# Patient Record
Sex: Male | Born: 1990 | Race: Black or African American | Hispanic: No | Marital: Single | Smoking: Current every day smoker
Health system: Southern US, Community
[De-identification: ages and names within clinical notes are randomized; demographics above are authoritative.]

## PROBLEM LIST (undated history)

## (undated) DIAGNOSIS — I10 Essential (primary) hypertension: Secondary | ICD-10-CM

## (undated) HISTORY — DX: Essential (primary) hypertension: I10
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
## (undated) NOTE — *Deleted (*Deleted)
PMR Admission Coordinator Pre-Admission Assessment  Patient: Christopher Marks is an 65 y.o., male MRN: [id number] DOB: 1990-09-13 Height: 5\' 7"  (170.2 cm) Weight: 77.1 kg  Insurance Information HMO: ***    PPO: ***     PCP: ***     IPA: ***     80/20: ***     OTHER: *** PRIMARY: ***      Policy#: ***      Subscriber: *** CM Name: ***      Phone#: ***     Fax#: *** Pre-Cert#: ***      Employer: *** Benefits:  Phone #: ***     Name: *** Eff. Date: ***     Deduct: ***      Out of Pocket Max: ***      Life Max: *** CIR: ***      SNF: *** Outpatient: ***     Co-Pay: *** Home Health: ***      Co-Pay: *** DME: ***     Co-Pay: *** Providers: *** SECONDARY: ***      Policy#: ***     Phone#: ***  Financial Counselor: ***      Phone#: ***  The "Data Collection Information Summary" for patients in Inpatient Rehabilitation Facilities with attached "Privacy Act Statement-Health Care Records" was provided and verbally reviewed with: {CHL IP Patient Family WG:956213086}  Emergency Contact Information Contact Information    Name Relation Home Work Mobile   [place] Grandmother   [phone]      Current Medical History  Patient Admitting Diagnosis: *** History of Present Illness: ***    Patient's medical record from *** has been reviewed by the rehabilitation admission coordinator and physician.  Past Medical History  History reviewed. No pertinent past medical history.  Family History   family history is not on file.  Prior Rehab/Hospitalizations Has the patient had prior rehab or hospitalizations prior to admission? {Yes/No/Unknown:304600602}  Has the patient had major surgery during 100 days prior to admission? {Yes/No/Unknown:304600602}   Current Medications  Current Facility-Administered Medications:  .  acetaminophen (TYLENOL) tablet 650 mg, 650 mg, Oral, Q6H PRN, Gaynelle Adu, MD, 650 mg at 01/29/20 0440 .  chlorhexidine gluconate (MEDLINE KIT) (PERIDEX) 0.12 % solution  15 mL, 15 mL, Mouth Rinse, BID, Violeta Gelinas, MD, 15 mL at 01/29/20 0835 .  Chlorhexidine Gluconate Cloth 2 % PADS 6 each, 6 each, Topical, Daily, Violeta Gelinas, MD, 6 each at 01/27/20 0906 .  cholecalciferol (VITAMIN D3) tablet 2,000 Units, 2,000 Units, Oral, BID, Despina Hidden, PA-C, 2,000 Units at 01/29/20 1020 .  docusate sodium (COLACE) capsule 100 mg, 100 mg, Oral, BID, Diamantina Monks, MD, 100 mg at 01/29/20 1020 .  enoxaparin (LOVENOX) injection 30 mg, 30 mg, Subcutaneous, Q12H, Lovick, Lennie Odor, MD, 30 mg at 01/29/20 1021 .  folic acid (FOLVITE) tablet 1 mg, 1 mg, Oral, Daily, Lovick, Lennie Odor, MD, 1 mg at 01/29/20 1021 .  lactated ringers infusion, , Intravenous, Continuous, Despina Hidden, PA-C, Stopped at 01/29/20 1159 .  LORazepam (ATIVAN) tablet 1-4 mg, 1-4 mg, Oral, Q1H PRN **OR** LORazepam (ATIVAN) injection 1-4 mg, 1-4 mg, Intravenous, Q1H PRN, Diamantina Monks, MD .  MEDLINE mouth rinse, 15 mL, Mouth Rinse, BID, Fredricka Bonine, Chelsea A, MD, 15 mL at 01/29/20 1021 .  metoprolol tartrate (LOPRESSOR) injection 5 mg, 5 mg, Intravenous, Q6H, Connor, Chelsea A, MD, 5 mg at 01/29/20 1134 .  morphine 4 MG/ML injection 4 mg, 4 mg, Intravenous, Q4H PRN, Ulyses Southward  A, PA-C, 4 mg at 01/29/20 1154 .  multivitamin with minerals tablet 1 tablet, 1 tablet, Oral, Daily, Diamantina Monks, MD, 1 tablet at 01/29/20 1021 .  ondansetron (ZOFRAN-ODT) disintegrating tablet 4 mg, 4 mg, Oral, Q6H PRN **OR** ondansetron (ZOFRAN) injection 4 mg, 4 mg, Intravenous, Q6H PRN, Ulyses Southward A, PA-C, 4 mg at 01/29/20 1134 .  oxyCODONE (Oxy IR/ROXICODONE) immediate release tablet 5-10 mg, 5-10 mg, Oral, Q4H PRN, Diamantina Monks, MD, 10 mg at 01/29/20 0536 .  piperacillin-tazobactam (ZOSYN) IVPB 3.375 g, 3.375 g, Intravenous, Q8H, von Dohlen, Haley B, RPH .  simethicone (MYLICON) chewable tablet 80 mg, 80 mg, Oral, Q6H PRN, Gaynelle Adu, MD, 80 mg at 01/29/20 1021 .  sodium phosphate 45 mmol in dextrose 5 %  250 mL infusion, 45 mmol, Intravenous, Once, Phylliss Blakes A, MD, Last Rate: 44 mL/hr at 01/29/20 1200, Rate Verify at 01/29/20 1200 .  thiamine tablet 100 mg, 100 mg, Oral, Daily, 100 mg at 01/28/20 0949 **OR** thiamine (B-1) injection 100 mg, 100 mg, Intravenous, Daily, Diamantina Monks, MD, 100 mg at 01/29/20 1020 .  Vitamin D (Ergocalciferol) (DRISDOL) capsule 50,000 Units, 50,000 Units, Oral, Q Fri-1800, Despina Hidden, PA-C, 50,000 Units at 01/27/20 2055  Patients Current Diet:  Diet Order            Diet NPO time specified Except for: Sips with Meds  Diet effective now                 Precautions / Restrictions Precautions Precautions: Fall Restrictions Weight Bearing Restrictions: Yes RLE Weight Bearing: Non weight bearing   Has the patient had 2 or more falls or a fall with injury in the past year? {Yes/No/Unknown:304600602}  Prior Activity Level Community (5-7x/wk): driving, working  Prior Functional Level Self Care: Did the patient need help bathing, dressing, using the toilet or eating? {Prior Functional ZOXWR:604540981}  Indoor Mobility: Did the patient need assistance with walking from room to room (with or without device)? {Prior Functional XBJYN:829562130}  Stairs: Did the patient need assistance with internal or external stairs (with or without device)? {Prior Functional QMVHQ:469629528}  Functional Cognition: Did the patient need help planning regular tasks such as shopping or remembering to take medications? {Prior Functional UXLKG:401027253}  Home Assistive Devices / Equipment    Prior Device Use: Indicate devices/aids used by the patient prior to current illness, exacerbation or injury? {Prior Device GUYQ:034742595}  Current Functional Level Cognition  Overall Cognitive Status: Impaired/Different from baseline Current Attention Level: Focused, Sustained Orientation Level: Oriented to person, Oriented to time, Disoriented to situation, Disoriented to  place Following Commands: Follows one step commands with increased time, Follows one step commands inconsistently Safety/Judgement: Decreased awareness of safety, Decreased awareness of deficits General Comments: [place] Level V  [place] [employer] Scales of Cognitive Functioning: Confused/inappropriate/non-agitated    Extremity Assessment (includes Sensation/Coordination)  Upper Extremity Assessment: RUE deficits/detail RUE Deficits / Details: Pt requires cues to attend to Rt UE, but is heavily distracted by abdominal pain.  He demonstrates full AROM of fingers when cued to attend to it,  He requires max cues and support at elbow to move hand to mouth, but was unable to figure out how to extend elbow - appears possibly apraxic.  He would not attempt to lift arm up and did not attempt any shoulder movement citing abdominal pain as the reason  [facility]  RUE Coordination: decreased fine motor, decreased gross motor  Lower Extremity Assessment: Defer to PT evaluation  RLE Deficits / Details: right leg with normal post op pain and weakness.  Generally 2/5, limited assessment due to pain. RLE: Unable to fully assess due to pain    ADLs  Overall ADL's : Needs assistance/impaired Eating/Feeding: Moderate assistance Eating/Feeding Details (indicate cue type and reason): mod A to drink from cup with his Lt hand  Grooming: Wash/dry hands, Wash/dry face, Oral care, Maximal assistance, Sitting Grooming Details (indicate cue type and reason): max hand over hand assist  Upper Body Bathing: Total assistance, Sitting Lower Body Bathing: Total assistance, Sit to/from stand, Bed level Upper Body Dressing : Total assistance, Sitting Lower Body Dressing: Total assistance, +2 for physical assistance, Sit to/from stand, Bed level Toilet Transfer: Total assistance, +2 for physical assistance, +2 for safety/equipment, BSC Toilet Transfer Details (indicate cue type and reason): unable  Toileting- Clothing  Manipulation and Hygiene: Total assistance, Bed level, Sit to/from stand Functional mobility during ADLs: Total assistance, Maximal assistance, +2 for physical assistance, +2 for safety/equipment, Rolling walker    Mobility  Overal bed mobility: Needs Assistance Bed Mobility: Supine to Sit Supine to sit: +2 for physical assistance, HOB elevated, Max assist Sit to supine: +2 for safety/equipment, Max assist, HOB elevated General bed mobility comments: HOB elevated, exit to rt (due to location of HHFNC); assist to move legs over EOB, pt not using LUE to pull to sit at EOB as he did 10/16 (pad under pelvis to assist)    Transfers  Overall transfer level: Needs assistance Equipment used: Rolling walker (2 wheeled) Transfers: Squat Pivot Transfers Sit to Stand: +2 physical assistance, Max assist Squat pivot transfers: Mod assist, +2 physical assistance, +2 safety/equipment General transfer comment: chair positioned on his left; agreeable to get to recliner; followed instructional cues and maintained TDWB RLE (PT's foot under his foot, so not fully NWB)    Ambulation / Gait / Stairs / Wheelchair Mobility  Ambulation/Gait General Gait Details: Unable at this time.     Posture / Balance Dynamic Sitting Balance Sitting balance - Comments: min-guard up to min assist with rt leaning  Balance Overall balance assessment: Needs assistance Sitting-balance support: Feet supported, No upper extremity supported, Bilateral upper extremity supported, Single extremity supported Sitting balance-Leahy Scale: Poor Sitting balance - Comments: min-guard up to min assist with rt leaning  Standing balance support: Bilateral upper extremity supported Standing balance-Leahy Scale: Zero Standing balance comment: two person and up to three person max assist in standing with RW for support.     Special needs/care consideration {Special Care Needs/Care Considerations:304600603}   Previous Home Environment (from  acute therapy documentation) Living Arrangements: Spouse/significant other Available Help at Discharge: Family Type of Home: House Home Layout: One level Home Access: Stairs to enter Entrance Stairs-Rails: Left Entrance Stairs-Number of Steps: 3 Bathroom Shower/Tub: Engineer, manufacturing systems: Standard Bathroom Accessibility: Yes How Accessible: Accessible via walker Home Care Services: No Additional Comments: Pt reports he lives with his girlfriend who, he reports will be available to assist at [employer].  He also reports he stays sometimes with his grand mother who recently retired (no family present to confirm)  Discharge Living Setting Plans for Discharge Living Setting: Patient's home Type of Home at Discharge: House Discharge Home Layout: One level Discharge Home Access: Stairs to enter Entrance Stairs-Rails: Left Entrance Stairs-Number of Steps: 3 Discharge Bathroom Shower/Tub: Tub/shower unit Discharge Bathroom Toilet: Standard Discharge Bathroom Accessibility: Yes How Accessible: Accessible via walker Does the patient have any problems obtaining your medications?: No  Social/Family/Support Systems Patient  Roles: Parent Anticipated Caregiver: Loma Messing, grandmother Anticipated Caregiver's Contact Information: [phone] Discharge Plan Discussed with Primary Caregiver: Yes Is Caregiver In Agreement with Plan?: Yes Does Caregiver/Family have Issues with Lodging/Transportation while Pt is in Rehab?: No  Goals Pt/Family Agrees to Admission and willing to participate: Yes Program Orientation Provided & Reviewed with Pt/Caregiver Including Roles  & Responsibilities: Yes  Decrease burden of Care through IP rehab admission: {Inpatient Rehab Care:20780}  Possible need for SNF placement upon discharge: ***  Patient Condition: {PATIENT'S CONDITION:22832}  Preadmission Screen Completed By:  Domingo Pulse, 01/29/2020 12:20 PM  ______________________________________________________________________   Discussed status with Dr. [name] on *** at *** and received approval for admission today.  Admission Coordinator:  Domingo Pulse, CCC-SLP, time ***Dorna Bloom ***   Assessment/Plan: Diagnosis: 1. Does the need for close, 24 hr/day Medical supervision in concert with the patient's rehab needs make it unreasonable for this patient to be served in a less intensive setting? {yes_no_potentially:3041433} 2. Co-Morbidities requiring supervision/potential complications: *** 3. Due to {due YN:8295621}, does the patient require 24 hr/day rehab nursing? {yes_no_potentially:3041433} 4. Does the patient require coordinated care of a physician, rehab nurse, PT, OT, and SLP to address physical and functional deficits in the context of the above medical diagnosis(es)? {yes_no_potentially:3041433} Addressing deficits in the following areas: {deficits:3041436} 5. Can the patient actively participate in an intensive therapy program of at least 3 hrs of therapy 5 days a week? {yes_no_potentially:3041433} 6. The potential for patient to make measurable gains while on inpatient rehab is {potential:3041437} 7. Anticipated functional outcomes upon discharge from inpatient rehab: {functional outcomes:304600100} PT, {functional outcomes:304600100} OT, {functional outcomes:304600100} SLP 8. Estimated rehab length of stay to reach the above functional goals is: *** 9. Anticipated discharge destination: {anticipated dc setting:21604} 10. Overall Rehab/Functional Prognosis: {potential:3041437}   MD Signature: ***

---
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
## 2014-01-15 ENCOUNTER — Emergency Department (HOSPITAL_COMMUNITY)
Admission: EM | Admit: 2014-01-15 | Discharge: 2014-01-15 | Disposition: A | Discharge status: Home / Self Care | Payer: Self-pay | Attending: Emergency Medicine | Admitting: Emergency Medicine

## 2014-01-15 ENCOUNTER — Encounter (HOSPITAL_COMMUNITY): Payer: Self-pay | Admitting: Emergency Medicine

## 2014-01-15 DIAGNOSIS — H109 Unspecified conjunctivitis: Secondary | ICD-10-CM | POA: Insufficient documentation

## 2014-01-15 DIAGNOSIS — Z720 Tobacco use: Secondary | ICD-10-CM | POA: Insufficient documentation

## 2014-01-15 MED ORDER — FLUORESCEIN SODIUM 1 MG OP STRP
1.0000 | ORAL_STRIP | Freq: Once | OPHTHALMIC | Status: AC
  Administered 2014-01-15: 1 via OPHTHALMIC
  Filled 2014-01-15: qty 1

## 2014-01-15 MED ORDER — ERYTHROMYCIN 5 MG/GM OP OINT: TOPICAL_OINTMENT | OPHTHALMIC | Status: DC

## 2014-01-15 NOTE — ED Provider Notes (Signed)
CSN: [id number]     Arrival date & time 01/15/14  1547 History  This chart was scribed for non-physician practitioner, Christopher Marks. Christopher Seat, PA-C working with Christopher Bucco, MD by Christopher Marks, ED scribe. This patient was seen in room TR04C/TR04C and the patient's care was started at 4:52 PM.  Chief Complaint  Patient presents with  . Eye Drainage   The history is provided by the patient. No language interpreter was used.   HPI Comments: Christopher Marks is a 23 y.o. male who presents to the Emergency Department complaining of constant, gradually worsening redness, pain, and discharge that started in the right eye and spread to his left eye beginning 4 days ago. Pt states that symptoms were initially itchy, but became painful 2 days ago. Pt states that his eyes feel gritty. He denies any changes to his vision since the onset of symptoms. He tried OTC medication with no improvement of symptoms. Pt does not wear contacts or glasses. He denies trauma or injury to the area. 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  History reviewed. No pertinent past medical history. History reviewed. No pertinent past surgical history. History reviewed. No pertinent family history. History  Substance Use Topics  . Smoking status: Current Every Day Smoker  . Smokeless tobacco: Not on file  . Alcohol Use: Yes    Review of Systems  Eyes: Positive for pain, discharge, redness and itching. Negative for visual disturbance.  All other systems reviewed and are negative.     Allergies  Review of patient's allergies indicates no known allergies.  Home Medications   Prior to Admission medications   Medication Sig Start Date End Date Taking? Authorizing Provider  erythromycin ophthalmic ointment Place a 1/2 inch ribbon of ointment into the lower eyelid. 01/15/14   Christopher Pilot Irine Seal, PA-C   BP 137/93  Pulse 84  Temp(Src) 98.3 F (36.8 C) (Oral)  Resp 16  SpO2 100% Physical Exam  Nursing note  and vitals reviewed. Constitutional: He appears well-developed and well-nourished. No distress.  HENT:  Head: Normocephalic and atraumatic.  Eyes: Conjunctivae and EOM are normal.  Bilateral injected conjunctiva with purulent discharge  No uptake of fluorescein stain  Neck: Neck supple. No tracheal deviation present.  Cardiovascular: Normal rate.   Pulmonary/Chest: Effort normal. No respiratory distress.  Skin: Skin is warm and dry.  Psychiatric: He has a normal mood and affect. His behavior is normal.    ED Course  Procedures (including critical care time) DIAGNOSTIC STUDIES: Oxygen Saturation is 100% on RA, normal by my interpretation.    COORDINATION OF CARE: 4:51 PM Fluorescein test negative. Discussed conjunctivitis and treatment with patient. Will order erythromycin ophthalmic ointment and discussed application of medicine.  Discussed return to work after completion of antibiotics with pt at bedside and pt agreed to plan.  Labs Review Labs Reviewed - No data to display  Imaging Review No results found.   EKG Interpretation None      MDM   Final diagnoses:  Bilateral conjunctivitis   Referral to Optho.   16:28 Visual Acuity BM Visual Acuity - Bilateral Near: 20/20 ; R Near: 20/30 ; L Near: 20/20 Complete resolution of pain with tetracaine drops  23 y.o.Christopher Marks's evaluation in the Emergency Department is complete. It has been determined that no acute conditions requiring further emergency intervention are present at this time. The patient/guardian have been advised of the diagnosis and plan. We have discussed signs and symptoms that warrant  return to the ED, such as changes or worsening in symptoms.  Vital signs are stable at discharge. Filed Vitals:   01/15/14 1704  BP: 137/93  Pulse: 84  Temp: 98.3 F (36.8 C)  Resp: 16    Patient/guardian has voiced understanding and agreed to follow-up with the PCP or specialist.  I personally performed the  services described in this documentation, which was scribed in my presence. The recorded information has been reviewed and is accurate.   Christopher Matasiffany G Shakiah Wester, PA-C 01/21/14 [id number]

## 2014-01-15 NOTE — Discharge Instructions (Signed)

## 2014-01-15 NOTE — ED Notes (Signed)
Per pt sts for 4 days right eye swelling, redness and drainage. sts his girlfriend recently had the same issue.

## 2014-01-15 NOTE — ED Notes (Signed)
Declined W/C at D/C and was escorted to lobby by RN. 

## 2014-01-23 NOTE — ED Provider Notes (Signed)
Medical screening examination/treatment/procedure(s) were performed by non-physician practitioner and as supervising physician I was immediately available for consultation/collaboration.   EKG Interpretation None        Deepti Gunawan, MD 01/23/14 1001 

## 2015-09-05 ENCOUNTER — Ambulatory Visit (HOSPITAL_COMMUNITY)
Admission: EM | Admit: 2015-09-05 | Discharge: 2015-09-05 | Disposition: A | Discharge status: Home / Self Care | Payer: Self-pay | Attending: Family Medicine | Admitting: Family Medicine

## 2015-09-05 ENCOUNTER — Encounter (HOSPITAL_COMMUNITY): Payer: Self-pay | Admitting: Emergency Medicine

## 2015-09-05 DIAGNOSIS — W57XXXA Bitten or stung by nonvenomous insect and other nonvenomous arthropods, initial encounter: Secondary | ICD-10-CM

## 2015-09-05 DIAGNOSIS — T148 Other injury of unspecified body region: Secondary | ICD-10-CM

## 2015-09-05 NOTE — ED Provider Notes (Signed)
CSN: [id number]     Arrival date & time 09/05/15  1338 History   First MD Initiated Contact with Patient 09/05/15 1456     Chief Complaint  Patient presents with  . Insect Bite   (Consider location/radiation/quality/duration/timing/severity/associated sxs/prior Treatment) HPI Comments: 25 year old male states that he was in some thick brush outside several days ago. He had noticed a small insect bite to the right forearm at that time. Over the past 2-3 days he has had more itching and now there is a small crop of vesicles and an area less than 1 cm. No erythema, pustules or lymphangitis. Patient denies systemic symptoms. He states that when he looked at it is scared him and he took off work today.   History reviewed. No pertinent past medical history. History reviewed. No pertinent past surgical history. No family history on file. Social History  Substance Use Topics  . Smoking status: Current Every Day Smoker  . Smokeless tobacco: None  . Alcohol Use: Yes    Review of Systems  Constitutional: Negative.   HENT: Negative.   Eyes: Negative.   Respiratory: Negative.   Skin:       As per history of present illness  Minor itching to the insect bite site.  Neurological: Negative.   All other systems reviewed and are negative.   Allergies  Review of patient's allergies indicates no known allergies.  Home Medications   Prior to Admission medications   Medication Sig Start Date End Date Taking? Authorizing Provider  erythromycin ophthalmic ointment Place a 1/2 inch ribbon of ointment into the lower eyelid. 01/15/14   Marlon Peliffany Greene, PA-C   Meds Ordered and Administered this Visit  Medications - No data to display  BP 142/88 mmHg  Pulse 77  Temp(Src) 98.6 F (37 C) (Oral)  Resp 16  SpO2 100% No data found.   Physical Exam  Constitutional: He is oriented to person, place, and time. He appears well-developed and well-nourished. No distress.  HENT:  Mouth/Throat:  Oropharynx is clear and moist.  No facial swelling or rash.  Eyes: Conjunctivae and EOM are normal.  Neck: Normal range of motion. Neck supple.  Cardiovascular: Normal rate.   Pulmonary/Chest: Effort normal.  Musculoskeletal: He exhibits no edema or tenderness.  Neurological: He is alert and oriented to person, place, and time. He exhibits normal muscle tone.  Skin: Skin is warm and dry. No rash noted.  There is an S-shaped trail of closely connected vesicles in an area less than 1 cm to the ulnar aspect of the right forearm. No surrounding erythema, lymphangitis, pustules or signs of infection.  Psychiatric: He has a normal mood and affect.  Nursing note and vitals reviewed.   ED Course  Procedures (including critical care time)  Labs Review Labs Reviewed - No data to display  Imaging Review No results found.   Visual Acuity Review  Right Eye Distance:   Left Eye Distance:   Bilateral Distance:    Right Eye Near:   Left Eye Near:    Bilateral Near:         MDM   1. Insect bite    Diphenhydramine Topical Cortaid 1% cream 3 times a day as needed Watch for signs of infection. Should there be any worsening or no improvement and 3-4 days recheck promptly.    Hayden Rasmussenavid Luisdavid Hamblin, NP 09/05/15 1511

## 2015-09-05 NOTE — ED Notes (Signed)
Pt c/o rash/insect bite to right forearm onset x3 days associated w/irritation Denies fevers, chills A&O x4... No acute distress.

## 2015-12-25 ENCOUNTER — Encounter (HOSPITAL_COMMUNITY): Payer: Self-pay | Admitting: Emergency Medicine

## 2015-12-25 ENCOUNTER — Ambulatory Visit (INDEPENDENT_AMBULATORY_CARE_PROVIDER_SITE_OTHER): Payer: Self-pay

## 2015-12-25 ENCOUNTER — Ambulatory Visit (HOSPITAL_COMMUNITY)
Admission: EM | Admit: 2015-12-25 | Discharge: 2015-12-25 | Disposition: A | Discharge status: Home / Self Care | Payer: Self-pay

## 2015-12-25 DIAGNOSIS — S63602A Unspecified sprain of left thumb, initial encounter: Secondary | ICD-10-CM

## 2015-12-25 IMAGING — DX DG FINGER THUMB 2+V*L*
3 series · 3 of 3 positions shown · non-contrast
Comparison: None.

CLINICAL DATA: 25-year-old with left thumb injury after playing
basketball. Initial encounter.

EXAM:
LEFT THUMB 2+V

[finger ap]
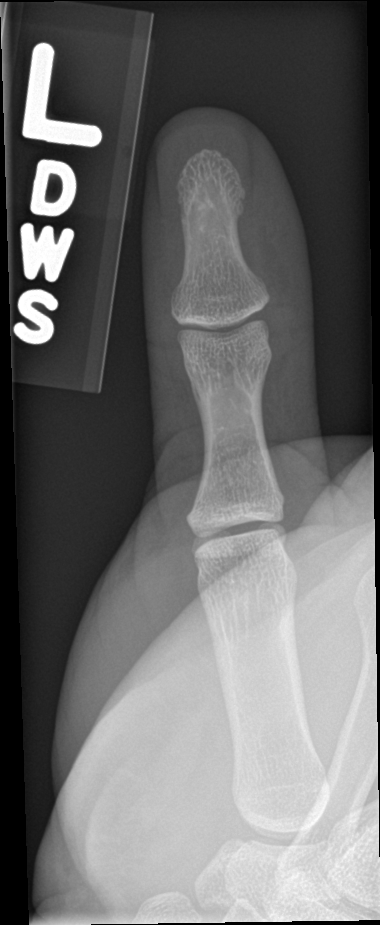

[finger obl]
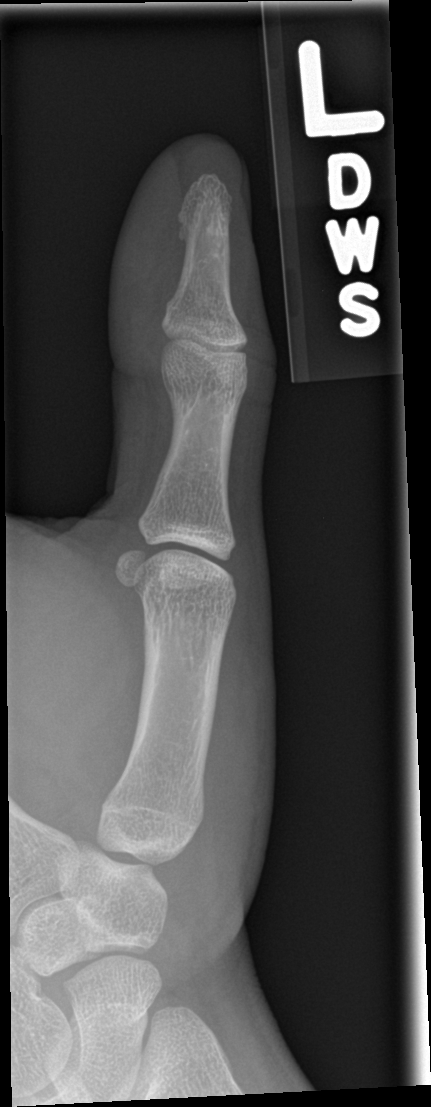

[finger lat]
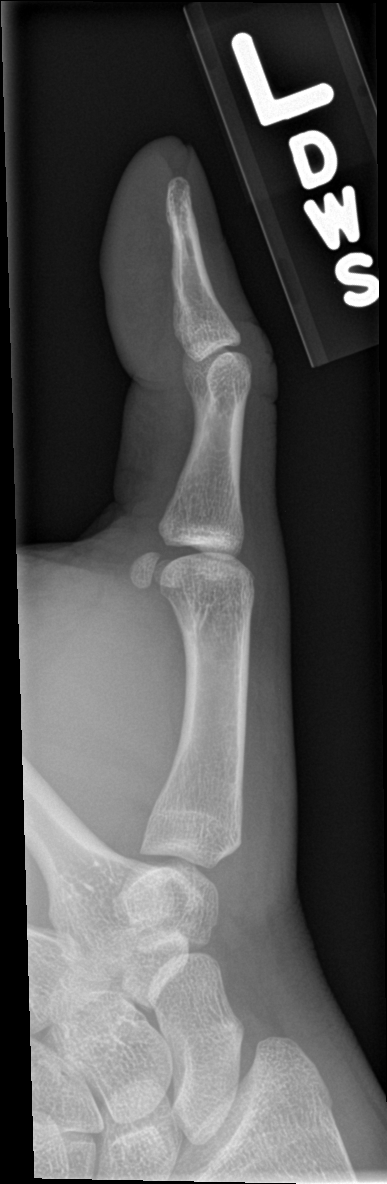

[3 of 3 positions shown; findings below may reference images not displayed]

FINDINGS: No fracture or dislocation.

If soft tissue injury were of high clinical concern and further
delineation clinically desired, MR imaging may then be considered.
IMPRESSION: No fracture or dislocation.

## 2015-12-25 MED ORDER — DICLOFENAC POTASSIUM 50 MG PO TABS: 50.0000 mg | ORAL_TABLET | Freq: Three times a day (TID) | ORAL | 0 refills | Status: DC

## 2015-12-25 NOTE — ED Triage Notes (Signed)
Patient reports both thumbs sore, but patient is most concerned with left thumb/hand pain.  Patient was playing basketball yesterday.  Reports several players falling over each other .  Patient not sure exactly how he landed on left thumb/hand.  Today has pain in base of thumb in palm of hand.    Patient reports he needs a work note.  Unable to work due to pain.

## 2015-12-25 NOTE — ED Notes (Signed)
Patient did receive a work note as agreed to by dr Artis Flockkindl.

## 2015-12-25 NOTE — ED Provider Notes (Signed)
[facility]    CSN: [id number] Arrival date & time: 12/25/15  1118  First Provider Contact:  First MD Initiated Contact with Patient 12/25/15 1219        History   Chief Complaint Chief Complaint  Patient presents with  . Finger Injury    HPI Christopher Marks is a 25 y.o. male.    Hand Injury  Location:  Finger Finger location:  L thumb Injury: yes   Time since incident:  1 day Mechanism of injury comment:  Injured yest playing bball when sev players fell . Pain details:    Radiates to:  Does not radiate   Severity:  Mild   Progression:  Unchanged Dislocation: no   Foreign body present:  No foreign bodies Prior injury to area:  No Relieved by:  NSAIDs Associated symptoms: stiffness   Associated symptoms: no decreased range of motion and no swelling     History reviewed. No pertinent past medical history.  There are no active problems to display for this patient.   History reviewed. No pertinent surgical history.     Home Medications    Prior to Admission medications   Medication Sig Start Date End Date Taking? Authorizing Provider  acetaminophen (TYLENOL) 325 MG tablet Take 650 mg by mouth every 6 (six) hours as needed.   Yes Historical Provider, MD  ibuprofen (ADVIL,MOTRIN) 200 MG tablet Take 200 mg by mouth every 6 (six) hours as needed.   Yes Historical Provider, MD  diclofenac (CATAFLAM) 50 MG tablet Take 1 tablet (50 mg total) by mouth 3 (three) times daily. 12/25/15   Linna Hoff, MD  erythromycin ophthalmic ointment Place a 1/2 inch ribbon of ointment into the lower eyelid. Patient not taking: Reported on 12/25/2015 01/15/14   Marlon Pel, PA-C    Family History History reviewed. No pertinent family history.  Social History Social History  Substance Use Topics  . Smoking status: Current Every Day Smoker  . Smokeless tobacco: Never Used  . Alcohol use Yes     Allergies   Review of patient's allergies indicates no known  allergies.   Review of Systems Review of Systems  Constitutional: Negative.   Musculoskeletal: Positive for stiffness. Negative for joint swelling.  Skin: Negative.   All other systems reviewed and are negative.    Physical Exam Triage Vital Signs ED Triage Vitals  Enc Vitals Group     BP 12/25/15 1146 (!) 157/105     Pulse Rate 12/25/15 1146 81     Resp 12/25/15 1146 16     Temp 12/25/15 1146 98.5 F (36.9 C)     Temp Source 12/25/15 1146 Oral     SpO2 12/25/15 1146 99 %     Weight --      Height --      Head Circumference --      Peak Flow --      Pain Score 12/25/15 1203 10     Pain Loc --      Pain Edu? --      Excl. in GC? --    No data found.   Updated Vital Signs BP (!) 157/105 (BP Location: Right Arm) Comment: notified Kim of BP  Pulse 81   Temp 98.5 F (36.9 C) (Oral)   Resp 16   SpO2 99%   Visual Acuity Right Eye Distance:   Left Eye Distance:   Bilateral Distance:    Right Eye Near:   Left Eye Near:  Bilateral Near:     Physical Exam  Constitutional: He is oriented to person, place, and time. He appears well-developed and well-nourished. No distress.  Musculoskeletal: Normal range of motion. He exhibits tenderness.       Left hand: He exhibits tenderness. He exhibits no deformity. Normal sensation noted. Normal strength noted.       Hands: Neurological: He is alert and oriented to person, place, and time.  Skin: Skin is warm and dry.  Nursing note and vitals reviewed.    UC Treatments / Results  Labs (all labs ordered are listed, but only abnormal results are displayed) Labs Reviewed - No data to display  EKG  EKG Interpretation None       Radiology No results found. X-rays reviewed and report per radiologist.  Procedures Procedures (including critical care time)  Medications Ordered in UC Medications - No data to display   Initial Impression / Assessment and Plan / UC Course  I have reviewed the triage vital signs  and the nursing notes.  Pertinent labs & imaging results that were available during my care of the patient were reviewed by me and considered in my medical decision making (see chart for details).  Clinical Course      Final Clinical Impressions(s) / UC Diagnoses   Final diagnoses:  Sprain of left thumb, initial encounter    New Prescriptions Discharge Medication List as of 12/25/2015  1:09 PM    START taking these medications   Details  diclofenac (CATAFLAM) 50 MG tablet Take 1 tablet (50 mg total) by mouth 3 (three) times daily., Starting Tue 12/25/2015, Print         Linna HoffJames D Nashly Olsson, MD 12/28/15 1012

## 2015-12-25 NOTE — Discharge Instructions (Signed)
Wear splint and use medicine as needed, see orthopedist if further problems. °

## 2016-04-07 ENCOUNTER — Emergency Department (HOSPITAL_COMMUNITY): Payer: Self-pay

## 2016-04-07 ENCOUNTER — Emergency Department (HOSPITAL_COMMUNITY)
Admission: EM | Admit: 2016-04-07 | Discharge: 2016-04-07 | Disposition: A | Discharge status: Home / Self Care | Payer: Self-pay | Attending: Emergency Medicine | Admitting: Emergency Medicine

## 2016-04-07 DIAGNOSIS — Y929 Unspecified place or not applicable: Secondary | ICD-10-CM | POA: Insufficient documentation

## 2016-04-07 DIAGNOSIS — Y999 Unspecified external cause status: Secondary | ICD-10-CM | POA: Insufficient documentation

## 2016-04-07 DIAGNOSIS — S0990XA Unspecified injury of head, initial encounter: Secondary | ICD-10-CM | POA: Insufficient documentation

## 2016-04-07 DIAGNOSIS — F17200 Nicotine dependence, unspecified, uncomplicated: Secondary | ICD-10-CM | POA: Insufficient documentation

## 2016-04-07 DIAGNOSIS — Y939 Activity, unspecified: Secondary | ICD-10-CM | POA: Insufficient documentation
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
## 2016-04-09 ENCOUNTER — Emergency Department (HOSPITAL_COMMUNITY)
Admission: EM | Admit: 2016-04-09 | Discharge: 2016-04-10 | Disposition: A | Discharge status: Home / Self Care | Payer: No Typology Code available for payment source | Attending: Emergency Medicine | Admitting: Emergency Medicine

## 2016-04-09 ENCOUNTER — Encounter (HOSPITAL_COMMUNITY): Payer: Self-pay

## 2016-04-09 DIAGNOSIS — T24032A Burn of unspecified degree of left lower leg, initial encounter: Secondary | ICD-10-CM | POA: Insufficient documentation

## 2016-04-09 DIAGNOSIS — T300 Burn of unspecified body region, unspecified degree: Secondary | ICD-10-CM

## 2016-04-09 DIAGNOSIS — T22012A Burn of unspecified degree of left forearm, initial encounter: Secondary | ICD-10-CM | POA: Diagnosis not present

## 2016-04-09 DIAGNOSIS — Y939 Activity, unspecified: Secondary | ICD-10-CM | POA: Insufficient documentation

## 2016-04-09 DIAGNOSIS — Y92410 Unspecified street and highway as the place of occurrence of the external cause: Secondary | ICD-10-CM | POA: Diagnosis not present

## 2016-04-09 DIAGNOSIS — Z23 Encounter for immunization: Secondary | ICD-10-CM | POA: Diagnosis not present

## 2016-04-09 DIAGNOSIS — S79921A Unspecified injury of right thigh, initial encounter: Secondary | ICD-10-CM | POA: Diagnosis present

## 2016-04-09 DIAGNOSIS — Y999 Unspecified external cause status: Secondary | ICD-10-CM | POA: Diagnosis not present

## 2016-04-09 DIAGNOSIS — S7011XA Contusion of right thigh, initial encounter: Secondary | ICD-10-CM | POA: Insufficient documentation

## 2016-04-09 DIAGNOSIS — F17200 Nicotine dependence, unspecified, uncomplicated: Secondary | ICD-10-CM | POA: Insufficient documentation

## 2016-04-09 LAB — CBC
HEMATOCRIT: 44.6 % (ref 39.0–52.0)
HEMOGLOBIN: 15.6 g/dL (ref 13.0–17.0)
MCH: 28 pg (ref 26.0–34.0)
MCHC: 35 g/dL (ref 30.0–36.0)
MCV: 80.1 fL (ref 78.0–100.0)
Platelets: 271 10*3/uL (ref 150–400)
RBC: 5.57 MIL/uL (ref 4.22–5.81)
RDW: 12.9 % (ref 11.5–15.5)
WBC: 14.2 10*3/uL — ABNORMAL HIGH (ref 4.0–10.5)
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
## 2016-04-09 MED ORDER — SILVER SULFADIAZINE 1 % EX CREA
TOPICAL_CREAM | Freq: Once | CUTANEOUS | Status: AC
  Administered 2016-04-10: 01:00:00 via TOPICAL
  Filled 2016-04-09: qty 85

## 2016-04-09 MED ORDER — OXYCODONE-ACETAMINOPHEN 5-325 MG PO TABS
1.0000 | ORAL_TABLET | Freq: Once | ORAL | Status: AC
  Administered 2016-04-10: 1 via ORAL
  Filled 2016-04-09: qty 1

## 2016-04-09 MED ORDER — TETANUS-DIPHTH-ACELL PERTUSSIS 5-2.5-18.5 LF-MCG/0.5 IM SUSP
0.5000 mL | Freq: Once | INTRAMUSCULAR | Status: AC
  Administered 2016-04-10: 0.5 mL via INTRAMUSCULAR
  Filled 2016-04-09: qty 0.5
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
## 2016-04-10 ENCOUNTER — Emergency Department (HOSPITAL_COMMUNITY): Payer: No Typology Code available for payment source

## 2016-04-10 LAB — BASIC METABOLIC PANEL
Anion gap: 13 (ref 5–15)
BUN: 9 mg/dL (ref 6–20)
CALCIUM: 9.7 mg/dL (ref 8.9–10.3)
CO2: 23 mmol/L (ref 22–32)
CREATININE: 1.07 mg/dL (ref 0.61–1.24)
Chloride: 105 mmol/L (ref 101–111)
GFR calc Af Amer: >60 mL/min (ref >60)
GLUCOSE: 108 mg/dL — AB (ref 65–99)
Potassium: 3.4 mmol/L — ABNORMAL LOW (ref 3.5–5.1)
Sodium: 141 mmol/L (ref 135–145)

## 2016-04-10 MED ORDER — HYDROCODONE-ACETAMINOPHEN 5-325 MG PO TABS: 1.0000 | ORAL_TABLET | ORAL | 0 refills | Status: DC | PRN

## 2016-07-12 ENCOUNTER — Encounter (HOSPITAL_COMMUNITY): Payer: Self-pay | Admitting: Emergency Medicine

## 2016-07-12 ENCOUNTER — Emergency Department (HOSPITAL_COMMUNITY)
Admission: EM | Admit: 2016-07-12 | Discharge: 2016-07-12 | Disposition: A | Discharge status: Home / Self Care | Payer: Self-pay | Attending: Emergency Medicine | Admitting: Emergency Medicine

## 2016-07-12 DIAGNOSIS — F10120 Alcohol abuse with intoxication, uncomplicated: Secondary | ICD-10-CM | POA: Insufficient documentation

## 2016-07-12 DIAGNOSIS — F17200 Nicotine dependence, unspecified, uncomplicated: Secondary | ICD-10-CM | POA: Insufficient documentation

## 2016-07-12 DIAGNOSIS — Z79899 Other long term (current) drug therapy: Secondary | ICD-10-CM | POA: Insufficient documentation

## 2016-07-12 DIAGNOSIS — F10920 Alcohol use, unspecified with intoxication, uncomplicated: Secondary | ICD-10-CM

## 2016-07-12 LAB — CBC WITH DIFFERENTIAL/PLATELET
BASOS ABS: 0 10*3/uL (ref 0.0–0.1)
Basophils Relative: 0 %
Eosinophils Absolute: 0.1 10*3/uL (ref 0.0–0.7)
Eosinophils Relative: 2 %
HEMATOCRIT: 41.1 % (ref 39.0–52.0)
HEMOGLOBIN: 13.9 g/dL (ref 13.0–17.0)
LYMPHS ABS: 2.7 10*3/uL (ref 0.7–4.0)
LYMPHS PCT: 51 %
MCH: 27.6 pg (ref 26.0–34.0)
MCHC: 33.8 g/dL (ref 30.0–36.0)
MCV: 81.5 fL (ref 78.0–100.0)
Monocytes Absolute: 0.4 10*3/uL (ref 0.1–1.0)
Monocytes Relative: 7 %
NEUTROS ABS: 2.1 10*3/uL (ref 1.7–7.7)
NEUTROS PCT: 40 %
PLATELETS: 277 10*3/uL (ref 150–400)
RBC: 5.04 MIL/uL (ref 4.22–5.81)
RDW: 12.5 % (ref 11.5–15.5)
WBC: 5.2 10*3/uL (ref 4.0–10.5)

## 2016-07-12 LAB — RAPID URINE DRUG SCREEN, HOSP PERFORMED
AMPHETAMINES: NOT DETECTED
BENZODIAZEPINES: NOT DETECTED
Barbiturates: NOT DETECTED
Cocaine: NOT DETECTED
Opiates: NOT DETECTED
TETRAHYDROCANNABINOL: NOT DETECTED

## 2016-07-12 LAB — ETHANOL: Alcohol, Ethyl (B): 99 mg/dL — ABNORMAL HIGH (ref <5)

## 2016-07-12 LAB — BASIC METABOLIC PANEL
ANION GAP: 10 (ref 5–15)
BUN: 10 mg/dL (ref 6–20)
CHLORIDE: 106 mmol/L (ref 101–111)
CO2: 21 mmol/L — AB (ref 22–32)
Calcium: 9.2 mg/dL (ref 8.9–10.3)
Creatinine, Ser: 1.03 mg/dL (ref 0.61–1.24)
GFR calc Af Amer: >60 mL/min (ref >60)
GLUCOSE: 109 mg/dL — AB (ref 65–99)
POTASSIUM: 3.7 mmol/L (ref 3.5–5.1)
Sodium: 137 mmol/L (ref 135–145)

## 2016-07-12 LAB — ACETAMINOPHEN LEVEL: Acetaminophen (Tylenol), Serum: <10 ug/mL — ABNORMAL LOW (ref 10–30)

## 2016-07-12 LAB — SALICYLATE LEVEL: Salicylate Lvl: <7 mg/dL (ref 2.8–30.0)

## 2016-07-12 NOTE — ED Notes (Signed)
Per EDP:  Pt is currently VOLUNTARY, but if he tries to leave, he will be IVC'd.

## 2016-07-12 NOTE — ED Provider Notes (Signed)
[facility]-EMERGENCY DEPT Provider Note   CSN: [id number] Arrival date & time: 07/12/16  4098     History   Chief Complaint Chief Complaint  Patient presents with  . Homicidal  . IVC    HPI Christopher Marks is a 26 y.o. male.  Patient presents emergency department brought in by [employer] under IVC for homicidal ideations. Patient states that he was going to kill his girlfriend. He states that he has been staying with her in an apartment that is under her name, but he has been paying for the lease, as well as for all of the furnishings of the apartment, however she kicked him out of the apartment, and he was told that he could not reenter because he is not on the lease. This caused him to become angry and frustrated. He states that he then went to his grandmother's house, who proceeded to tell him that he should have never paid for any of those things anyway. This further frustrated the patient, and he began stating that he would kill his girlfriend. He states that he said this so that he can go to jail. He states that he wanted a place to rest and to think. He states that "I also told [employer] that I was drunk so that they would take me to jail."  He denies any other associated medical problems. Denies any other symptoms.   The history is provided by the patient. No language interpreter was used.    History reviewed. No pertinent past medical history.  There are no active problems to display for this patient.   History reviewed. No pertinent surgical history.     Home Medications    Prior to Admission medications   Medication Sig Start Date End Date Taking? Authorizing Provider  acetaminophen (TYLENOL) 325 MG tablet Take 650 mg by mouth every 6 (six) hours as needed.    Historical Provider, MD  diclofenac (CATAFLAM) 50 MG tablet Take 1 tablet (50 mg total) by mouth 3 (three) times daily. 12/25/15   Linna Hoff, MD  erythromycin ophthalmic ointment Place a 1/2 inch ribbon of ointment into  the lower eyelid. Patient not taking: Reported on 12/25/2015 01/15/14   Marlon Pel, PA-C  HYDROcodone-acetaminophen (NORCO/VICODIN) 5-325 MG tablet Take 1 tablet by mouth every 4 (four) hours as needed for moderate pain. 04/10/16   Gilda Crease, MD  ibuprofen (ADVIL,MOTRIN) 200 MG tablet Take 200 mg by mouth every 6 (six) hours as needed.    Historical Provider, MD    Family History History reviewed. No pertinent family history.  Social History Social History  Substance Use Topics  . Smoking status: Current Every Day Smoker  . Smokeless tobacco: Never Used  . Alcohol use Yes     Allergies   Patient has no known allergies.   Review of Systems Review of Systems  All other systems reviewed and are negative.    Physical Exam Updated Vital Signs There were no vitals taken for this visit.  Physical Exam  Constitutional: He is oriented to person, place, and time. He appears well-developed and well-nourished.  HENT:  Head: Normocephalic and atraumatic.  Eyes: Conjunctivae and EOM are normal. Pupils are equal, round, and reactive to light. Right eye exhibits no discharge. Left eye exhibits no discharge. No scleral icterus.  Neck: Normal range of motion. Neck supple. No JVD present.  Cardiovascular: Normal rate, regular rhythm and normal heart sounds.  Exam reveals no gallop and no friction rub.   No  murmur heard. Pulmonary/Chest: Effort normal and breath sounds normal. No respiratory distress. He has no wheezes. He has no rales. He exhibits no tenderness.  Abdominal: Soft. He exhibits no distension and no mass. There is no tenderness. There is no rebound and no guarding.  Musculoskeletal: Normal range of motion. He exhibits no edema or tenderness.  Neurological: He is alert and oriented to person, place, and time.  Skin: Skin is warm and dry.  Psychiatric: He has a normal mood and affect. His behavior is normal. Judgment and thought content normal.  Nursing note and  vitals reviewed.    ED Treatments / Results  Labs (all labs ordered are listed, but only abnormal results are displayed) Labs Reviewed  CBC WITH DIFFERENTIAL/PLATELET  BASIC METABOLIC PANEL  RAPID URINE DRUG SCREEN, HOSP PERFORMED  ACETAMINOPHEN LEVEL  SALICYLATE LEVEL  ETHANOL    EKG  EKG Interpretation None       Radiology No results found.  Procedures Procedures (including critical care time)  Medications Ordered in ED Medications - No data to display   Initial Impression / Assessment and Plan / ED Course  I have reviewed the triage vital signs and the nursing notes.  Pertinent labs & imaging results that were available during my care of the patient were reviewed by me and considered in my medical decision making (see chart for details).     Patient with expressed homicidal ideation. Under IVC. Medically clear pending normal labs. TTS consult pending.  Dispo per TTS/psych  6:06 AM [employer] did NOT IVC patient.  Patient is currently VOLUNTARY, but will be made IVC if he attempts to leave.  Currently agreeable with plan to speak with psych in the AM.  Final Clinical Impressions(s) / ED Diagnoses   Final diagnoses:  Homicidal ideations    New Prescriptions New Prescriptions   No medications on file     Roxy Horseman, PA-C 07/12/16 1610    Roxy Horseman, PA-C 07/12/16 [id number]    Zadie Rhine, MD 07/13/16 [phone]

## 2016-07-12 NOTE — ED Triage Notes (Signed)
Brought in by [facility] under IVC papers for homicidal ideations.  Per [employer], pt has been threatening to kill her ex-girlfriend/girlfriend.  Pt has been uncooperative and non-compliant with the [employer] officers at the scene.  Pt has stated, "if you're not going to put me to jail,  I am going to kill her".  Pt admits to drinking alcohol tonight.

## 2016-07-12 NOTE — ED Notes (Signed)
Bed: WA09 Expected date:  Expected time:  Means of arrival:  Comments: IVC/[employer]

## 2016-07-12 NOTE — ED Notes (Signed)
Pt is up and doesn't understand that he will be IV'ed if he tries to leave.  Pt is expressing that he has appointments and needs to be out of the hospital.  Pt stated that he wants to see the doctor.  Charge nurse  and MD informed.

## 2016-07-12 NOTE — ED Provider Notes (Signed)
Patient is now awake and alert. He wants to leave the emergency department. TTS yet to evaluate patient. States he is not homicidal or suicidal. His earlier statements were said out of anger and after drinking alcohol. He has no plan or desire to harm his girlfriend. I do not believe the patient currently poses an acute danger to himself or others. Will be discharged from the emergency department with outpatient resources.   Loren Racer, MD 07/12/16 [phone]

## 2016-07-12 NOTE — ED Notes (Signed)
[employer] left, but a sitter outside the patient's door.
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
## 2017-04-11 ENCOUNTER — Emergency Department (HOSPITAL_COMMUNITY)
Admission: EM | Admit: 2017-04-11 | Discharge: 2017-04-12 | Disposition: A | Discharge status: Home / Self Care | Payer: Self-pay | Attending: Emergency Medicine | Admitting: Emergency Medicine

## 2017-04-11 ENCOUNTER — Encounter (HOSPITAL_COMMUNITY): Payer: Self-pay | Admitting: Emergency Medicine

## 2017-04-11 DIAGNOSIS — Z23 Encounter for immunization: Secondary | ICD-10-CM | POA: Insufficient documentation

## 2017-04-11 DIAGNOSIS — S01511A Laceration without foreign body of lip, initial encounter: Secondary | ICD-10-CM | POA: Insufficient documentation

## 2017-04-11 DIAGNOSIS — F17200 Nicotine dependence, unspecified, uncomplicated: Secondary | ICD-10-CM | POA: Insufficient documentation

## 2017-04-11 DIAGNOSIS — Y929 Unspecified place or not applicable: Secondary | ICD-10-CM | POA: Insufficient documentation

## 2017-04-11 DIAGNOSIS — Y939 Activity, unspecified: Secondary | ICD-10-CM | POA: Insufficient documentation

## 2017-04-11 DIAGNOSIS — Y999 Unspecified external cause status: Secondary | ICD-10-CM | POA: Insufficient documentation

## 2017-04-11 DIAGNOSIS — F10920 Alcohol use, unspecified with intoxication, uncomplicated: Secondary | ICD-10-CM | POA: Insufficient documentation

## 2017-04-11 DIAGNOSIS — S50311A Abrasion of right elbow, initial encounter: Secondary | ICD-10-CM | POA: Insufficient documentation

## 2017-04-11 MED ORDER — TETANUS-DIPHTH-ACELL PERTUSSIS 5-2.5-18.5 LF-MCG/0.5 IM SUSP
0.5000 mL | Freq: Once | INTRAMUSCULAR | Status: AC
  Administered 2017-04-12: 0.5 mL via INTRAMUSCULAR
  Filled 2017-04-11: qty 0.5

## 2017-04-11 MED ORDER — LIDOCAINE HCL 2 % IJ SOLN
10.0000 mL | Freq: Once | INTRAMUSCULAR | Status: AC
  Administered 2017-04-12: 200 mg
  Filled 2017-04-11: qty 20

## 2017-04-11 NOTE — ED Triage Notes (Signed)
No missing teeth. No jaw pain.
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
## 2017-04-18 ENCOUNTER — Other Ambulatory Visit: Payer: Self-pay

## 2017-04-18 ENCOUNTER — Emergency Department (HOSPITAL_COMMUNITY)
Admission: EM | Admit: 2017-04-18 | Discharge: 2017-04-18 | Disposition: A | Discharge status: Home / Self Care | Payer: Self-pay | Attending: Emergency Medicine | Admitting: Emergency Medicine

## 2017-04-18 ENCOUNTER — Encounter (HOSPITAL_COMMUNITY): Payer: Self-pay | Admitting: *Deleted

## 2017-04-18 DIAGNOSIS — Z4802 Encounter for removal of sutures: Secondary | ICD-10-CM

## 2017-04-18 DIAGNOSIS — X58XXXD Exposure to other specified factors, subsequent encounter: Secondary | ICD-10-CM | POA: Insufficient documentation

## 2017-04-18 DIAGNOSIS — F17210 Nicotine dependence, cigarettes, uncomplicated: Secondary | ICD-10-CM | POA: Insufficient documentation

## 2017-04-18 DIAGNOSIS — S01511D Laceration without foreign body of lip, subsequent encounter: Secondary | ICD-10-CM | POA: Insufficient documentation

## 2017-04-18 NOTE — ED Triage Notes (Signed)
Pt here to have sutures removed to L upper lip.  No s/s of infection noted.
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
## 2017-04-18 NOTE — Discharge Instructions (Signed)
Please read attached information regarding your condition.

## 2017-08-04 ENCOUNTER — Other Ambulatory Visit: Payer: Self-pay

## 2017-08-04 ENCOUNTER — Encounter (HOSPITAL_COMMUNITY): Payer: Self-pay

## 2017-08-04 ENCOUNTER — Emergency Department (HOSPITAL_COMMUNITY)
Admission: EM | Admit: 2017-08-04 | Discharge: 2017-08-05 | Disposition: A | Discharge status: Home / Self Care | Payer: Self-pay | Attending: Emergency Medicine | Admitting: Emergency Medicine

## 2017-08-04 DIAGNOSIS — F17210 Nicotine dependence, cigarettes, uncomplicated: Secondary | ICD-10-CM | POA: Insufficient documentation

## 2017-08-04 DIAGNOSIS — R3129 Other microscopic hematuria: Secondary | ICD-10-CM | POA: Insufficient documentation

## 2017-08-04 LAB — CBC WITH DIFFERENTIAL/PLATELET
Basophils Absolute: 0 10*3/uL (ref 0.0–0.1)
Basophils Relative: 0 %
EOS PCT: 2 %
Eosinophils Absolute: 0.2 10*3/uL (ref 0.0–0.7)
HEMATOCRIT: 42.1 % (ref 39.0–52.0)
HEMOGLOBIN: 14.3 g/dL (ref 13.0–17.0)
LYMPHS ABS: 4 10*3/uL (ref 0.7–4.0)
LYMPHS PCT: 43 %
MCH: 28.1 pg (ref 26.0–34.0)
MCHC: 34 g/dL (ref 30.0–36.0)
MCV: 82.9 fL (ref 78.0–100.0)
Monocytes Absolute: 0.7 10*3/uL (ref 0.1–1.0)
Monocytes Relative: 8 %
Neutro Abs: 4.3 10*3/uL (ref 1.7–7.7)
Neutrophils Relative %: 47 %
PLATELETS: 253 10*3/uL (ref 150–400)
RBC: 5.08 MIL/uL (ref 4.22–5.81)
RDW: 12.8 % (ref 11.5–15.5)
WBC: 9.3 10*3/uL (ref 4.0–10.5)

## 2017-08-04 LAB — URINALYSIS, ROUTINE W REFLEX MICROSCOPIC
BILIRUBIN URINE: NEGATIVE
Bacteria, UA: NONE SEEN
GLUCOSE, UA: NEGATIVE mg/dL
KETONES UR: NEGATIVE mg/dL
Leukocytes, UA: NEGATIVE
NITRITE: NEGATIVE
PH: 6 (ref 5.0–8.0)
Protein, ur: NEGATIVE mg/dL
RBC / HPF: >50 RBC/hpf — ABNORMAL HIGH (ref 0–5)
Specific Gravity, Urine: 1.017 (ref 1.005–1.030)

## 2017-08-04 LAB — COMPREHENSIVE METABOLIC PANEL
ALK PHOS: 67 U/L (ref 38–126)
ALT: 34 U/L (ref 17–63)
AST: 32 U/L (ref 15–41)
Albumin: 4.3 g/dL (ref 3.5–5.0)
Anion gap: 11 (ref 5–15)
BILIRUBIN TOTAL: 1.2 mg/dL (ref 0.3–1.2)
BUN: 12 mg/dL (ref 6–20)
CO2: 22 mmol/L (ref 22–32)
CREATININE: 1.08 mg/dL (ref 0.61–1.24)
Calcium: 9.9 mg/dL (ref 8.9–10.3)
Chloride: 103 mmol/L (ref 101–111)
Glucose, Bld: 97 mg/dL (ref 65–99)
Potassium: 3.5 mmol/L (ref 3.5–5.1)
Sodium: 136 mmol/L (ref 135–145)
TOTAL PROTEIN: 7.4 g/dL (ref 6.5–8.1)

## 2017-08-04 NOTE — ED Triage Notes (Signed)
Pt presents with onset of hematuria that was noted today.  Pt denies any abdominal pain, denies any pain when he voids.  Pt endorses epigastric pain "for a while".  Pt reports daily alcohol, a pint of liquor and 8 beers.

## 2017-08-05 ENCOUNTER — Encounter (HOSPITAL_COMMUNITY): Payer: Self-pay | Admitting: *Deleted
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
## 2018-05-13 ENCOUNTER — Encounter (HOSPITAL_COMMUNITY): Payer: Self-pay | Admitting: Emergency Medicine

## 2018-05-13 ENCOUNTER — Emergency Department (HOSPITAL_COMMUNITY): Payer: Self-pay

## 2018-05-13 ENCOUNTER — Emergency Department (HOSPITAL_COMMUNITY)
Admission: EM | Admit: 2018-05-13 | Discharge: 2018-05-13 | Disposition: A | Discharge status: Home / Self Care | Payer: Self-pay | Attending: Emergency Medicine | Admitting: Emergency Medicine

## 2018-05-13 DIAGNOSIS — J069 Acute upper respiratory infection, unspecified: Secondary | ICD-10-CM | POA: Insufficient documentation

## 2018-05-13 DIAGNOSIS — F17210 Nicotine dependence, cigarettes, uncomplicated: Secondary | ICD-10-CM | POA: Insufficient documentation

## 2018-05-13 MED ORDER — PSEUDOEPH-BROMPHEN-DM 30-2-10 MG/5ML PO SYRP: 5.0000 mL | ORAL_SOLUTION | Freq: Four times a day (QID) | ORAL | 0 refills | Status: DC | PRN

## 2018-05-13 NOTE — ED Triage Notes (Signed)
Per pt, states cough, congestion, back pain for 2 weeks-no relief with OTC meds
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
## 2018-05-13 NOTE — ED Notes (Signed)
Bed: WTR6 Expected date:  Expected time:  Means of arrival:  Comments: 

## 2018-05-13 NOTE — Discharge Instructions (Signed)
Thank you for allowing me to care for you today. Please return to the emergency department if you have new or worsening symptoms. Take your medications as instructed.  ° °

## 2018-06-27 ENCOUNTER — Other Ambulatory Visit: Payer: Self-pay

## 2018-06-27 ENCOUNTER — Emergency Department (HOSPITAL_COMMUNITY)
Admission: EM | Admit: 2018-06-27 | Discharge: 2018-06-27 | Disposition: A | Discharge status: Home / Self Care | Payer: Self-pay | Attending: Emergency Medicine | Admitting: Emergency Medicine

## 2018-06-27 ENCOUNTER — Encounter (HOSPITAL_COMMUNITY): Payer: Self-pay | Admitting: *Deleted

## 2018-06-27 DIAGNOSIS — R630 Anorexia: Secondary | ICD-10-CM | POA: Insufficient documentation

## 2018-06-27 DIAGNOSIS — R531 Weakness: Secondary | ICD-10-CM | POA: Insufficient documentation

## 2018-06-27 DIAGNOSIS — F17210 Nicotine dependence, cigarettes, uncomplicated: Secondary | ICD-10-CM | POA: Insufficient documentation

## 2018-06-27 MED ORDER — SODIUM CHLORIDE 0.9 % IV BOLUS
1000.0000 mL | Freq: Once | INTRAVENOUS | Status: DC
Start: 2018-06-27 — End: 2018-06-27

## 2018-06-27 NOTE — ED Triage Notes (Signed)
Pt states he has felt weak and lightheaded x 3 days. Pt has lack of appetite. Pt states he left work and was hoping to feel better over the weekend but has not. Pt denies cough, fever, shortness of breath.

## 2018-06-27 NOTE — ED Provider Notes (Signed)
[facility] Provider Note   CSN: [id number] Arrival date & time: 06/27/18  1702    History   Chief Complaint Chief Complaint  Patient presents with  . Weakness    HPI Christopher Marks is a 28 y.o. male who presents with a 3-day history of generalized weakness, fatigue and lack of appetite.  Patient denies any fever, chills, cough, sore throat, ear pain, chest pain, shortness of breath, abdominal pain, nausea, vomiting, urinary symptoms.  He has not taken any medications at home for symptoms.  He has continued to drink well.  He denies any recent travel or known sick contacts.  Patient reports he ate a little more today and is feeling little better.  He reports he gets up at 5 AM every morning.     HPI  History reviewed. No pertinent past medical history.  There are no active problems to display for this patient.   History reviewed. No pertinent surgical history.      Home Medications    Prior to Admission medications   Medication Sig Start Date End Date Taking? Authorizing Provider  brompheniramine-pseudoephedrine-DM 30-2-10 MG/5ML syrup Take 5 mLs by mouth 4 (four) times daily as needed. 05/13/18   Arlyn Dunning, PA-C    Family History No family history on file.  Social History Social History   Tobacco Use  . Smoking status: Current Every Day Smoker    Packs/day: 0.50    Types: Cigarettes  . Smokeless tobacco: Never Used  Substance Use Topics  . Alcohol use: Yes  . Drug use: Yes    Types: Marijuana     Allergies   Patient has no known allergies.   Review of Systems Review of Systems  Constitutional: Positive for appetite change and fatigue. Negative for chills and fever.  HENT: Negative for facial swelling and sore throat.   Respiratory: Negative for shortness of breath.   Cardiovascular: Negative for chest pain.  Gastrointestinal: Negative for abdominal pain, nausea and vomiting.  Genitourinary: Negative for  dysuria.  Musculoskeletal: Negative for back pain.  Skin: Negative for rash and wound.  Neurological: Positive for weakness (generalized). Negative for headaches.  Psychiatric/Behavioral: The patient is not nervous/anxious.      Physical Exam Updated Vital Signs BP (!) 165/115 (BP Location: Left Arm)   Pulse (!) 114   Temp 98.8 F (37.1 C) (Oral)   Resp 18   SpO2 99%   Physical Exam Vitals signs and nursing note reviewed.  Constitutional:      General: He is not in acute distress.    Appearance: He is well-developed. He is not diaphoretic.  HENT:     Head: Normocephalic and atraumatic.     Mouth/Throat:     Pharynx: No oropharyngeal exudate.  Eyes:     General: No scleral icterus.       Right eye: No discharge.        Left eye: No discharge.     Conjunctiva/sclera: Conjunctivae normal.     Pupils: Pupils are equal, round, and reactive to light.  Neck:     Musculoskeletal: Normal range of motion and neck supple.     Thyroid: No thyromegaly.  Cardiovascular:     Rate and Rhythm: Normal rate and regular rhythm.     Heart sounds: Normal heart sounds. No murmur. No friction rub. No gallop.   Pulmonary:     Effort: Pulmonary effort is normal. No respiratory distress.     Breath sounds: Normal breath  sounds. No stridor. No wheezing or rales.  Abdominal:     General: Bowel sounds are normal. There is no distension.     Palpations: Abdomen is soft.     Tenderness: There is no abdominal tenderness. There is no guarding or rebound.  Lymphadenopathy:     Cervical: No cervical adenopathy.  Skin:    General: Skin is warm and dry.     Coloration: Skin is not pale.     Findings: No rash.  Neurological:     Mental Status: He is alert.     Coordination: Coordination normal.     Comments: 5/5 strength to all 4 extremities, equal bilateral grip strength      ED Treatments / Results  Labs (all labs ordered are listed, but only abnormal results are displayed) Labs Reviewed   COMPREHENSIVE METABOLIC PANEL  CBC WITH DIFFERENTIAL/PLATELET    EKG None  Radiology No results found.  Procedures Procedures (including critical care time)  Medications Ordered in ED Medications  sodium chloride 0.9 % bolus 1,000 mL (has no administration in time range)     Initial Impression / Assessment and Plan / ED Course  I have reviewed the triage vital signs and the nursing notes.  Pertinent labs & imaging results that were available during my care of the patient were reviewed by me and considered in my medical decision making (see chart for details).        Patient presenting with a 3-day history of generalized weakness and fatigue and lack of appetite.  He is concerned he may have coronavirus.  He denies any fever, cough, shortness of breath, known contacts, or travel.  I recommended screening labs and IV fluids, however patient declines considering his insurance status.  Patient given reassurance that coronavirus is unlikely at this time, but if he develops any other symptoms, or for symptoms or not improving, that he should return.  He understands and agrees with plan.  Patient's blood pressure was 118/70 prior to discharge, however not reflected on the chart.  No longer tachycardic.  His blood pressure was elevated on arrival, however would not begin treatment at this time.  Patient vitals stable throughout ED course and discharged in satisfactory condition.  Final Clinical Impressions(s) / ED Diagnoses   Final diagnoses:  Generalized weakness  Lack of appetite    ED Discharge Orders    None       Emi Holes, PA-C 06/27/18 1836    Melene Plan, DO 06/27/18 2140

## 2018-06-27 NOTE — ED Notes (Signed)
Patient refuses labs, EKG, PO challenge, IV and IV fluid. PA made aware.

## 2018-06-27 NOTE — Discharge Instructions (Signed)
It is unlikely that you have coronavirus at this time.  If you develop fever, cough, or shortness of breath, or if you are around anyone with known coronavirus or travel to endemic area, please return for testing.  If your symptoms are not improving over the next few days, I also recommend returning for further evaluation and the labs I recommended.  I also recommend taking a multivitamin such as vita fusion or Centrum for men.  Please return the emergency department if you develop any new or worsening symptoms.

## 2018-10-17 ENCOUNTER — Other Ambulatory Visit: Payer: Self-pay

## 2018-10-17 ENCOUNTER — Encounter (HOSPITAL_COMMUNITY): Payer: Self-pay

## 2018-10-17 ENCOUNTER — Emergency Department (HOSPITAL_COMMUNITY)
Admission: EM | Admit: 2018-10-17 | Discharge: 2018-10-17 | Disposition: A | Discharge status: Home / Self Care | Payer: Self-pay | Attending: Emergency Medicine | Admitting: Emergency Medicine

## 2018-10-17 DIAGNOSIS — Z5321 Procedure and treatment not carried out due to patient leaving prior to being seen by health care provider: Secondary | ICD-10-CM | POA: Insufficient documentation

## 2018-10-17 DIAGNOSIS — R319 Hematuria, unspecified: Secondary | ICD-10-CM | POA: Insufficient documentation

## 2018-10-17 LAB — URINALYSIS, ROUTINE W REFLEX MICROSCOPIC
Bacteria, UA: NONE SEEN
Bilirubin Urine: NEGATIVE
Glucose, UA: NEGATIVE mg/dL
Ketones, ur: NEGATIVE mg/dL
Leukocytes,Ua: NEGATIVE
Nitrite: NEGATIVE
Protein, ur: 100 mg/dL — AB
RBC / HPF: >50 RBC/hpf — ABNORMAL HIGH (ref 0–5)
Specific Gravity, Urine: 1.021 (ref 1.005–1.030)
WBC, UA: >50 WBC/hpf — ABNORMAL HIGH (ref 0–5)
pH: 6 (ref 5.0–8.0)
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
## 2018-11-26 ENCOUNTER — Other Ambulatory Visit: Payer: Self-pay

## 2018-11-26 DIAGNOSIS — Z20822 Contact with and (suspected) exposure to covid-19: Secondary | ICD-10-CM

## 2018-11-28 LAB — NOVEL CORONAVIRUS, NAA: SARS-CoV-2, NAA: NOT DETECTED

## 2018-11-30 ENCOUNTER — Telehealth: Payer: Self-pay | Admitting: General Practice

## 2018-11-30 NOTE — Telephone Encounter (Signed)
Pt called to get COVID results, made him aware that is negative.

## 2019-03-15 ENCOUNTER — Emergency Department (HOSPITAL_COMMUNITY): Payer: Self-pay

## 2019-03-15 ENCOUNTER — Emergency Department (HOSPITAL_COMMUNITY)
Admission: EM | Admit: 2019-03-15 | Discharge: 2019-03-15 | Disposition: A | Discharge status: Home / Self Care | Payer: Self-pay | Attending: Emergency Medicine | Admitting: Emergency Medicine

## 2019-03-15 ENCOUNTER — Other Ambulatory Visit: Payer: Self-pay

## 2019-03-15 DIAGNOSIS — Y929 Unspecified place or not applicable: Secondary | ICD-10-CM | POA: Insufficient documentation

## 2019-03-15 DIAGNOSIS — F17210 Nicotine dependence, cigarettes, uncomplicated: Secondary | ICD-10-CM | POA: Insufficient documentation

## 2019-03-15 DIAGNOSIS — Y999 Unspecified external cause status: Secondary | ICD-10-CM | POA: Insufficient documentation

## 2019-03-15 DIAGNOSIS — W010XXA Fall on same level from slipping, tripping and stumbling without subsequent striking against object, initial encounter: Secondary | ICD-10-CM | POA: Insufficient documentation

## 2019-03-15 DIAGNOSIS — Y9321 Activity, ice skating: Secondary | ICD-10-CM | POA: Insufficient documentation

## 2019-03-15 DIAGNOSIS — S62521A Displaced fracture of distal phalanx of right thumb, initial encounter for closed fracture: Secondary | ICD-10-CM | POA: Insufficient documentation

## 2019-03-15 IMAGING — CR DG FINGER THUMB 2+V*R*
3 series · 3 of 3 positions shown · non-contrast
Comparison: None.

CLINICAL DATA: Recent fall with right thumb pain, initial encounter

EXAM:
RIGHT THUMB 2+V

[finger ap]
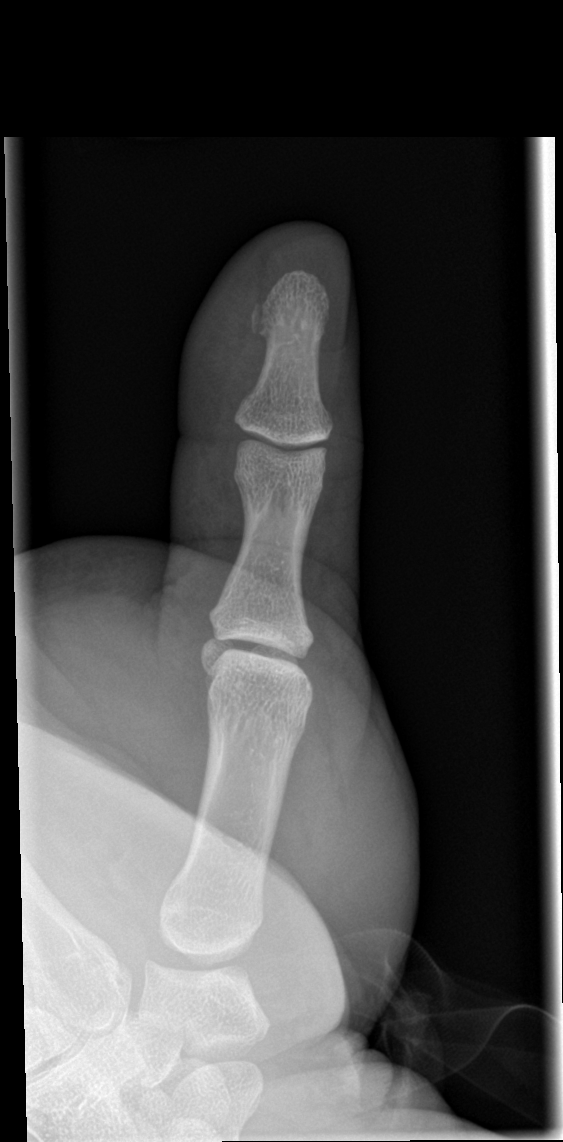

[finger obl]
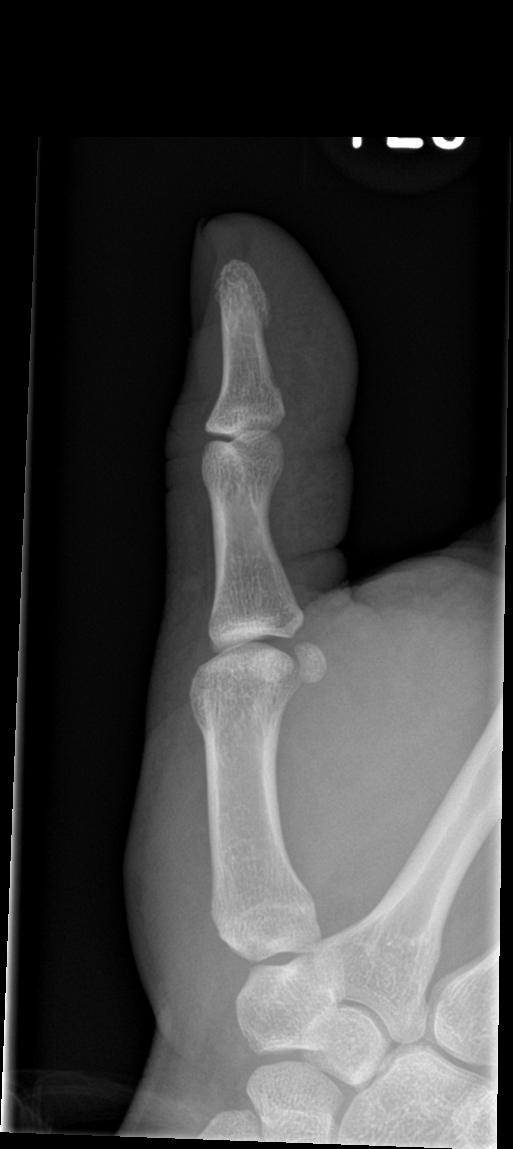

[finger lat]
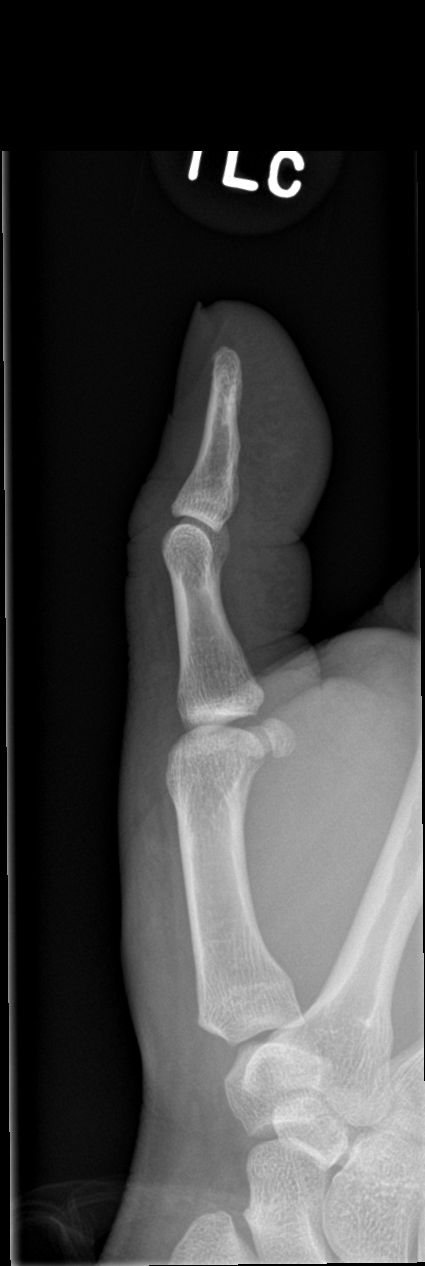

[3 of 3 positions shown; findings below may reference images not displayed]

FINDINGS: Small avulsion at the medial aspect of the distal phalangeal tuft is
noted. Mild soft tissue swelling is seen. No other focal abnormality
is noted.
IMPRESSION: Distal phalangeal tuft fracture with soft tissue swelling.

## 2019-03-15 MED ORDER — NAPROXEN 500 MG PO TABS: 500.0000 mg | ORAL_TABLET | Freq: Two times a day (BID) | ORAL | 0 refills | Status: DC

## 2019-03-15 NOTE — ED Triage Notes (Signed)
Pt sts he fell backwards while skating with his son this weekend and tried to catch himself with his R hand. Pt has pain and swelling to R thumb. 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

## 2019-03-15 NOTE — ED Provider Notes (Signed)
[facility] EMERGENCY DEPARTMENT Provider Note   CSN: [id number] Arrival date & time: 03/15/19  1134     History   Chief Complaint Chief Complaint  Patient presents with  . Finger Injury    HPI Christopher Marks is a 28 y.o. male.     Patient is a 28 year old male with no past medical history presenting to the emergency department for right thumb pain.  Patient reports that he is ice-skating over the weekend with his son and he slipped and he fell backwards landing on his right thumb.  Reports pain and swelling to the distal aspect of the thumb.  No other injuries.  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  There are no active problems to display for this patient.   No past surgical history on file.      Home Medications    Prior to Admission medications   Medication Sig Start Date End Date Taking? Authorizing Provider  brompheniramine-pseudoephedrine-DM 30-2-10 MG/5ML syrup Take 5 mLs by mouth 4 (four) times daily as needed. 05/13/18   Alveria Apley, PA-C  naproxen (NAPROSYN) 500 MG tablet Take 1 tablet (500 mg total) by mouth 2 (two) times daily. 03/15/19   Alveria Apley, PA-C    Family History No family history on file.  Social History Social History   Tobacco Use  . Smoking status: Current Every Day Smoker    Packs/day: 0.50    Types: Cigarettes  . Smokeless tobacco: Never Used  Substance Use Topics  . Alcohol use: Yes  . Drug use: Yes    Types: Marijuana     Allergies   Patient has no known allergies.   Review of Systems Review of Systems  Constitutional: Negative for chills and fever.  Musculoskeletal: Positive for arthralgias and joint swelling.  Skin: Negative for rash and wound.  Neurological: Negative for headaches.     Physical Exam Updated Vital Signs BP (!) 170/112 (BP Location: Right Arm)   Pulse (!) 101   Temp 98.4 F (36.9 C) (Oral)   Resp 16    SpO2 98%   Physical Exam Vitals signs and nursing note reviewed.  Constitutional:      Appearance: Normal appearance.  HENT:     Head: Normocephalic.  Eyes:     Conjunctiva/sclera: Conjunctivae normal.  Pulmonary:     Effort: Pulmonary effort is normal.  Musculoskeletal:     Right wrist: Normal.     Right hand: He exhibits tenderness, bony tenderness and swelling. He exhibits no deformity.     Comments: Swelling to the distal aspect of the right thumb with a subungual hematoma of the thumb of about 40% of the nail.  Tender to palpation at the DIP and pain with any range of motion of the thumb.  No tenderness at the MCP. No snuff box tenderness  Skin:    General: Skin is dry.     Capillary Refill: Capillary refill takes less than 2 seconds.  Neurological:     Mental Status: He is alert.     Sensory: No sensory deficit.  Psychiatric:        Mood and Affect: Mood normal.      ED Treatments / Results  Labs (all labs ordered are listed, but only abnormal results are displayed) Labs Reviewed - No data to display  EKG None  Radiology Dg Finger Thumb Right  Result Date: 03/15/2019 CLINICAL  DATA:  Recent fall with right thumb pain, initial encounter EXAM: RIGHT THUMB 2+V COMPARISON:  None. FINDINGS: Small avulsion at the medial aspect of the distal phalangeal tuft is noted. Mild soft tissue swelling is seen. No other focal abnormality is noted. IMPRESSION: Distal phalangeal tuft fracture with soft tissue swelling. Electronically Signed   By: Alcide Clever M.D.   On: 03/15/2019 12:10    Procedures Procedures (including critical care time)  Medications Ordered in ED Medications - No data to display   Initial Impression / Assessment and Plan / ED Course  I have reviewed the triage vital signs and the nursing notes.  Pertinent labs & imaging results that were available during my care of the patient were reviewed by me and considered in my medical decision making (see chart for  details).  Clinical Course as of Mar 14 1224  Tue Mar 15, 2019  1221 Patient with contusion of the distal thumb this past Saturday also with distal tuft fracture.  He does have a subungual hematoma and significant swelling.  I discussed nail trephination with the patient risks and benefits.  He does not want to try that at this time.  Will give patient a work note and follow-up with orthopedics as he would like to return to work as soon as possible. RICE treatment discussed and rx for naprosyn will be prescribed and he will be placed in thumb spica. No snuff box tenderness but I am also concerned for some tendinitis given his pain with range of motion   [KM]    Clinical Course User Index [KM] Arlyn Dunning, PA-C         Final Clinical Impressions(s) / ED Diagnoses   Final diagnoses:  Closed fracture of tuft of distal phalanx of right thumb    ED Discharge Orders         Ordered    naproxen (NAPROSYN) 500 MG tablet  2 times daily     03/15/19 1225           Jeral Pinch 03/15/19 1225    Benjiman Core, MD 03/15/19 1500

## 2019-03-15 NOTE — Discharge Instructions (Signed)
You have fractured your finger.  Please apply ice as much as possible and keep your hand elevated.  Keep the splint on.

## 2019-03-15 NOTE — Progress Notes (Signed)
Orthopedic Tech Progress Note Patient Details:  EDWING FIGLEY Apr 05, 1991 [id number]  Ortho Devices Type of Ortho Device: Thumb velcro splint Ortho Device/Splint Location: RUE Ortho Device/Splint Interventions: Ordered, Application   Post Interventions Patient Tolerated: Well Instructions Provided: Care of device   Braulio Bosch 03/15/2019, 12:40 PM

## 2019-03-15 NOTE — ED Notes (Signed)
Patient verbalizes understanding of discharge instructions. Opportunity for questioning and answers were provided. Armband removed by staff, pt discharged from ED.  

## 2020-01-26 ENCOUNTER — Emergency Department (HOSPITAL_COMMUNITY): Payer: No Typology Code available for payment source

## 2020-01-26 ENCOUNTER — Encounter (HOSPITAL_COMMUNITY): Admission: EM | Disposition: A | Discharge status: Rehabilitation Facility | Payer: Self-pay | Source: Home / Self Care

## 2020-01-26 ENCOUNTER — Inpatient Hospital Stay (HOSPITAL_COMMUNITY): Payer: No Typology Code available for payment source | Admitting: Certified Registered Nurse Anesthetist

## 2020-01-26 ENCOUNTER — Encounter (HOSPITAL_COMMUNITY): Payer: Self-pay

## 2020-01-26 ENCOUNTER — Encounter (HOSPITAL_COMMUNITY): Payer: Self-pay | Admitting: Emergency Medicine

## 2020-01-26 ENCOUNTER — Inpatient Hospital Stay (HOSPITAL_COMMUNITY)
Admission: EM | Admit: 2020-01-26 | Discharge: 2020-02-15 | DRG: 957 | Disposition: A | Discharge status: Rehabilitation Facility | Payer: No Typology Code available for payment source | Attending: General Surgery | Admitting: General Surgery

## 2020-01-26 ENCOUNTER — Inpatient Hospital Stay (HOSPITAL_COMMUNITY): Payer: No Typology Code available for payment source

## 2020-01-26 DIAGNOSIS — S35299A Unspecified injury of branches of celiac and mesenteric artery, initial encounter: Secondary | ICD-10-CM

## 2020-01-26 DIAGNOSIS — R509 Fever, unspecified: Secondary | ICD-10-CM

## 2020-01-26 DIAGNOSIS — Z789 Other specified health status: Secondary | ICD-10-CM

## 2020-01-26 DIAGNOSIS — S069X9A Unspecified intracranial injury with loss of consciousness of unspecified duration, initial encounter: Secondary | ICD-10-CM | POA: Diagnosis present

## 2020-01-26 DIAGNOSIS — R0902 Hypoxemia: Secondary | ICD-10-CM

## 2020-01-26 DIAGNOSIS — S36039A Unspecified laceration of spleen, initial encounter: Secondary | ICD-10-CM

## 2020-01-26 DIAGNOSIS — T1490XA Injury, unspecified, initial encounter: Secondary | ICD-10-CM

## 2020-01-26 DIAGNOSIS — S36112A Contusion of liver, initial encounter: Secondary | ICD-10-CM

## 2020-01-26 DIAGNOSIS — F10920 Alcohol use, unspecified with intoxication, uncomplicated: Secondary | ICD-10-CM

## 2020-01-26 DIAGNOSIS — S0990XA Unspecified injury of head, initial encounter: Secondary | ICD-10-CM

## 2020-01-26 DIAGNOSIS — S36892A Contusion of other intra-abdominal organs, initial encounter: Secondary | ICD-10-CM

## 2020-01-26 DIAGNOSIS — R7989 Other specified abnormal findings of blood chemistry: Secondary | ICD-10-CM

## 2020-01-26 DIAGNOSIS — R7401 Elevation of levels of liver transaminase levels: Secondary | ICD-10-CM

## 2020-01-26 DIAGNOSIS — Z419 Encounter for procedure for purposes other than remedying health state, unspecified: Secondary | ICD-10-CM

## 2020-01-26 DIAGNOSIS — S82201B Unspecified fracture of shaft of right tibia, initial encounter for open fracture type I or II: Secondary | ICD-10-CM

## 2020-01-26 DIAGNOSIS — T148XXA Other injury of unspecified body region, initial encounter: Secondary | ICD-10-CM

## 2020-01-26 DIAGNOSIS — S82401B Unspecified fracture of shaft of right fibula, initial encounter for open fracture type I or II: Secondary | ICD-10-CM

## 2020-01-26 DIAGNOSIS — Z452 Encounter for adjustment and management of vascular access device: Secondary | ICD-10-CM

## 2020-01-26 DIAGNOSIS — L8990 Pressure ulcer of unspecified site, unspecified stage: Secondary | ICD-10-CM | POA: Insufficient documentation

## 2020-01-26 DIAGNOSIS — S36113A Laceration of liver, unspecified degree, initial encounter: Secondary | ICD-10-CM

## 2020-01-26 DIAGNOSIS — S2249XA Multiple fractures of ribs, unspecified side, initial encounter for closed fracture: Secondary | ICD-10-CM

## 2020-01-26 DIAGNOSIS — Z0189 Encounter for other specified special examinations: Secondary | ICD-10-CM

## 2020-01-26 DIAGNOSIS — Z4659 Encounter for fitting and adjustment of other gastrointestinal appliance and device: Secondary | ICD-10-CM

## 2020-01-26 DIAGNOSIS — T82868A Thrombosis of vascular prosthetic devices, implants and grafts, initial encounter: Secondary | ICD-10-CM | POA: Diagnosis not present

## 2020-01-26 DIAGNOSIS — R000 Tachycardia, unspecified: Secondary | ICD-10-CM | POA: Diagnosis not present

## 2020-01-26 DIAGNOSIS — R109 Unspecified abdominal pain: Secondary | ICD-10-CM | POA: Diagnosis not present

## 2020-01-26 DIAGNOSIS — E663 Overweight: Secondary | ICD-10-CM | POA: Diagnosis present

## 2020-01-26 DIAGNOSIS — I639 Cerebral infarction, unspecified: Secondary | ICD-10-CM

## 2020-01-26 DIAGNOSIS — R29712 NIHSS score 12: Secondary | ICD-10-CM | POA: Diagnosis present

## 2020-01-26 DIAGNOSIS — M549 Dorsalgia, unspecified: Secondary | ICD-10-CM | POA: Diagnosis not present

## 2020-01-26 DIAGNOSIS — S82263B Displaced segmental fracture of shaft of unspecified tibia, initial encounter for open fracture type I or II: Principal | ICD-10-CM | POA: Diagnosis present

## 2020-01-26 DIAGNOSIS — Y908 Blood alcohol level of 240 mg/100 ml or more: Secondary | ICD-10-CM | POA: Diagnosis present

## 2020-01-26 DIAGNOSIS — R402112 Coma scale, eyes open, never, at arrival to emergency department: Secondary | ICD-10-CM | POA: Diagnosis present

## 2020-01-26 DIAGNOSIS — R402312 Coma scale, best motor response, none, at arrival to emergency department: Secondary | ICD-10-CM | POA: Diagnosis present

## 2020-01-26 DIAGNOSIS — F919 Conduct disorder, unspecified: Secondary | ICD-10-CM | POA: Diagnosis not present

## 2020-01-26 DIAGNOSIS — F10239 Alcohol dependence with withdrawal, unspecified: Secondary | ICD-10-CM | POA: Diagnosis present

## 2020-01-26 DIAGNOSIS — T07XXXA Unspecified multiple injuries, initial encounter: Secondary | ICD-10-CM

## 2020-01-26 DIAGNOSIS — R402212 Coma scale, best verbal response, none, at arrival to emergency department: Secondary | ICD-10-CM | POA: Diagnosis present

## 2020-01-26 DIAGNOSIS — E7220 Disorder of urea cycle metabolism, unspecified: Secondary | ICD-10-CM | POA: Diagnosis present

## 2020-01-26 DIAGNOSIS — E785 Hyperlipidemia, unspecified: Secondary | ICD-10-CM | POA: Diagnosis present

## 2020-01-26 DIAGNOSIS — Z20822 Contact with and (suspected) exposure to covid-19: Secondary | ICD-10-CM | POA: Diagnosis present

## 2020-01-26 DIAGNOSIS — F10220 Alcohol dependence with intoxication, uncomplicated: Secondary | ICD-10-CM | POA: Diagnosis present

## 2020-01-26 DIAGNOSIS — Y92410 Unspecified street and highway as the place of occurrence of the external cause: Secondary | ICD-10-CM | POA: Diagnosis not present

## 2020-01-26 DIAGNOSIS — D62 Acute posthemorrhagic anemia: Secondary | ICD-10-CM | POA: Diagnosis not present

## 2020-01-26 DIAGNOSIS — Y848 Other medical procedures as the cause of abnormal reaction of the patient, or of later complication, without mention of misadventure at the time of the procedure: Secondary | ICD-10-CM | POA: Diagnosis not present

## 2020-01-26 DIAGNOSIS — I6340 Cerebral infarction due to embolism of unspecified cerebral artery: Secondary | ICD-10-CM | POA: Diagnosis not present

## 2020-01-26 DIAGNOSIS — I82622 Acute embolism and thrombosis of deep veins of left upper extremity: Secondary | ICD-10-CM | POA: Diagnosis not present

## 2020-01-26 DIAGNOSIS — D72829 Elevated white blood cell count, unspecified: Secondary | ICD-10-CM

## 2020-01-26 DIAGNOSIS — R339 Retention of urine, unspecified: Secondary | ICD-10-CM | POA: Diagnosis not present

## 2020-01-26 DIAGNOSIS — S069XAA Unspecified intracranial injury with loss of consciousness status unknown, initial encounter: Secondary | ICD-10-CM | POA: Diagnosis present

## 2020-01-26 DIAGNOSIS — Z781 Physical restraint status: Secondary | ICD-10-CM

## 2020-01-26 DIAGNOSIS — Z823 Family history of stroke: Secondary | ICD-10-CM

## 2020-01-26 DIAGNOSIS — I10 Essential (primary) hypertension: Secondary | ICD-10-CM | POA: Diagnosis not present

## 2020-01-26 DIAGNOSIS — Z6829 Body mass index (BMI) 29.0-29.9, adult: Secondary | ICD-10-CM

## 2020-01-26 HISTORY — PX: TIBIA IM NAIL INSERTION: SHX2516

## 2020-01-26 HISTORY — DX: Cerebral infarction, unspecified: I63.9

## 2020-01-26 LAB — RESPIRATORY PANEL BY RT PCR (FLU A&B, COVID)
Influenza A by PCR: NEGATIVE
Influenza B by PCR: NEGATIVE
SARS Coronavirus 2 by RT PCR: NEGATIVE

## 2020-01-26 LAB — CBC
HCT: 43.2 % (ref 39.0–52.0)
Hemoglobin: 13.7 g/dL (ref 13.0–17.0)
MCH: 27.4 pg (ref 26.0–34.0)
MCHC: 31.7 g/dL (ref 30.0–36.0)
MCV: 86.4 fL (ref 80.0–100.0)
Platelets: 318 10*3/uL (ref 150–400)
RBC: 5 MIL/uL (ref 4.22–5.81)
RDW: 12.9 % (ref 11.5–15.5)
WBC: 9.9 10*3/uL (ref 4.0–10.5)
nRBC: 0.2 % (ref 0.0–0.2)

## 2020-01-26 LAB — I-STAT CHEM 8, ED
BUN: 10 mg/dL (ref 6–20)
Calcium, Ion: 1.06 mmol/L — ABNORMAL LOW (ref 1.15–1.40)
Chloride: 108 mmol/L (ref 98–111)
Creatinine, Ser: 1.5 mg/dL — ABNORMAL HIGH (ref 0.61–1.24)
Glucose, Bld: 135 mg/dL — ABNORMAL HIGH (ref 70–99)
HCT: 44 % (ref 39.0–52.0)
Hemoglobin: 15 g/dL (ref 13.0–17.0)
Potassium: 3.5 mmol/L (ref 3.5–5.1)
Sodium: 142 mmol/L (ref 135–145)
TCO2: 22 mmol/L (ref 22–32)

## 2020-01-26 LAB — COMPREHENSIVE METABOLIC PANEL
ALT: 611 U/L — ABNORMAL HIGH (ref 0–44)
AST: 647 U/L — ABNORMAL HIGH (ref 15–41)
Albumin: 4.3 g/dL (ref 3.5–5.0)
Alkaline Phosphatase: 60 U/L (ref 38–126)
Anion gap: 15 (ref 5–15)
BUN: 9 mg/dL (ref 6–20)
CO2: 18 mmol/L — ABNORMAL LOW (ref 22–32)
Calcium: 9 mg/dL (ref 8.9–10.3)
Chloride: 106 mmol/L (ref 98–111)
Creatinine, Ser: 1.33 mg/dL — ABNORMAL HIGH (ref 0.61–1.24)
GFR, Estimated: >60 mL/min (ref >60)
Glucose, Bld: 137 mg/dL — ABNORMAL HIGH (ref 70–99)
Potassium: 3.4 mmol/L — ABNORMAL LOW (ref 3.5–5.1)
Sodium: 139 mmol/L (ref 135–145)
Total Bilirubin: 0.9 mg/dL (ref 0.3–1.2)
Total Protein: 7.2 g/dL (ref 6.5–8.1)

## 2020-01-26 LAB — POCT I-STAT 7, (LYTES, BLD GAS, ICA,H+H)
Acid-base deficit: 8 mmol/L — ABNORMAL HIGH (ref 0.0–2.0)
Bicarbonate: 18.8 mmol/L — ABNORMAL LOW (ref 20.0–28.0)
Calcium, Ion: 1.12 mmol/L — ABNORMAL LOW (ref 1.15–1.40)
HCT: 39 % (ref 39.0–52.0)
Hemoglobin: 13.3 g/dL (ref 13.0–17.0)
O2 Saturation: 100 %
Patient temperature: 34.1
Potassium: 3.7 mmol/L (ref 3.5–5.1)
Sodium: 143 mmol/L (ref 135–145)
TCO2: 20 mmol/L — ABNORMAL LOW (ref 22–32)
pCO2 arterial: 35.9 mmHg (ref 32.0–48.0)
pH, Arterial: 7.313 — ABNORMAL LOW (ref 7.350–7.450)
pO2, Arterial: 393 mmHg — ABNORMAL HIGH (ref 83.0–108.0)

## 2020-01-26 LAB — I-STAT ARTERIAL BLOOD GAS, ED
Acid-base deficit: 8 mmol/L — ABNORMAL HIGH (ref 0.0–2.0)
Bicarbonate: 18.6 mmol/L — ABNORMAL LOW (ref 20.0–28.0)
Calcium, Ion: 1.05 mmol/L — ABNORMAL LOW (ref 1.15–1.40)
HCT: 38 % — ABNORMAL LOW (ref 39.0–52.0)
Hemoglobin: 12.9 g/dL — ABNORMAL LOW (ref 13.0–17.0)
O2 Saturation: 100 %
Patient temperature: 96.6
Potassium: 3.3 mmol/L — ABNORMAL LOW (ref 3.5–5.1)
Sodium: 139 mmol/L (ref 135–145)
TCO2: 20 mmol/L — ABNORMAL LOW (ref 22–32)
pCO2 arterial: 38.2 mmHg (ref 32.0–48.0)
pH, Arterial: 7.289 — ABNORMAL LOW (ref 7.350–7.450)
pO2, Arterial: 216 mmHg — ABNORMAL HIGH (ref 83.0–108.0)

## 2020-01-26 LAB — ETHANOL: Alcohol, Ethyl (B): 245 mg/dL — ABNORMAL HIGH (ref <10)

## 2020-01-26 LAB — PROTIME-INR
INR: 1 (ref 0.8–1.2)
Prothrombin Time: 13.2 seconds (ref 11.4–15.2)

## 2020-01-26 LAB — URINALYSIS, ROUTINE W REFLEX MICROSCOPIC
Bacteria, UA: NONE SEEN
Bilirubin Urine: NEGATIVE
Glucose, UA: 150 mg/dL — AB
Ketones, ur: NEGATIVE mg/dL
Leukocytes,Ua: NEGATIVE
Nitrite: NEGATIVE
Protein, ur: NEGATIVE mg/dL
Specific Gravity, Urine: 1.02 (ref 1.005–1.030)
pH: 7 (ref 5.0–8.0)

## 2020-01-26 LAB — LACTIC ACID, PLASMA: Lactic Acid, Venous: 5 mmol/L (ref 0.5–1.9)

## 2020-01-26 LAB — ABO/RH: ABO/RH(D): O POS

## 2020-01-26 LAB — HIV ANTIBODY (ROUTINE TESTING W REFLEX): HIV Screen 4th Generation wRfx: NONREACTIVE

## 2020-01-26 LAB — CDS SEROLOGY

## 2020-01-26 LAB — MRSA PCR SCREENING: MRSA by PCR: NEGATIVE
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
## 2020-01-26 SURGERY — INSERTION, INTRAMEDULLARY ROD, TIBIA
Anesthesia: General | Site: Leg Lower | Laterality: Right

## 2020-01-26 MED ORDER — PHENYLEPHRINE HCL (PRESSORS) 10 MG/ML IV SOLN
INTRAVENOUS | Status: AC
  Filled 2020-01-26: qty 1

## 2020-01-26 MED ORDER — VANCOMYCIN HCL 1000 MG IV SOLR
INTRAVENOUS | Status: DC | PRN
  Administered 2020-01-26: 1000 mg

## 2020-01-26 MED ORDER — ONDANSETRON HCL 4 MG/2ML IJ SOLN
INTRAMUSCULAR | Status: DC | PRN
  Administered 2020-01-26: 4 mg via INTRAVENOUS

## 2020-01-26 MED ORDER — PANTOPRAZOLE SODIUM 40 MG PO TBEC: 40.0000 mg | DELAYED_RELEASE_TABLET | Freq: Every day | ORAL | Status: DC

## 2020-01-26 MED ORDER — CEFAZOLIN SODIUM-DEXTROSE 2-4 GM/100ML-% IV SOLN
2.0000 g | Freq: Three times a day (TID) | INTRAVENOUS | Status: AC
  Administered 2020-01-26 – 2020-01-27 (×3): 2 g via INTRAVENOUS
  Filled 2020-01-26 (×3): qty 100

## 2020-01-26 MED ORDER — DEXAMETHASONE SODIUM PHOSPHATE 10 MG/ML IJ SOLN
INTRAMUSCULAR | Status: AC
  Filled 2020-01-26: qty 1

## 2020-01-26 MED ORDER — SODIUM CHLORIDE 0.9 % IV SOLN
INTRAVENOUS | Status: AC | PRN
  Administered 2020-01-26: 1000 mL via INTRAVENOUS

## 2020-01-26 MED ORDER — OXYCODONE HCL 5 MG PO TABS: 5.0000 mg | ORAL_TABLET | ORAL | Status: DC | PRN

## 2020-01-26 MED ORDER — CHLORHEXIDINE GLUCONATE 0.12% ORAL RINSE (MEDLINE KIT)
15.0000 mL | Freq: Two times a day (BID) | OROMUCOSAL | Status: DC
  Administered 2020-01-26 – 2020-02-01 (×11): 15 mL via OROMUCOSAL

## 2020-01-26 MED ORDER — SODIUM CHLORIDE 0.9 % IV SOLN
INTRAVENOUS | Status: DC | PRN
  Administered 2020-01-26: 6 mg/h via INTRAVENOUS

## 2020-01-26 MED ORDER — ORAL CARE MOUTH RINSE
15.0000 mL | OROMUCOSAL | Status: DC
  Administered 2020-01-26 – 2020-01-27 (×11): 15 mL via OROMUCOSAL

## 2020-01-26 MED ORDER — MORPHINE SULFATE (PF) 4 MG/ML IV SOLN
4.0000 mg | INTRAVENOUS | Status: DC | PRN
  Administered 2020-01-27 – 2020-02-14 (×14): 4 mg via INTRAVENOUS
  Filled 2020-01-26 (×16): qty 1

## 2020-01-26 MED ORDER — ONDANSETRON HCL 4 MG/2ML IJ SOLN
4.0000 mg | Freq: Four times a day (QID) | INTRAMUSCULAR | Status: DC | PRN
  Administered 2020-01-29 – 2020-02-11 (×4): 4 mg via INTRAVENOUS
  Filled 2020-01-26 (×4): qty 2

## 2020-01-26 MED ORDER — FENTANYL 2500MCG IN NS 250ML (10MCG/ML) PREMIX INFUSION
0.0000 ug/h | Freq: Once | INTRAVENOUS | Status: AC
  Administered 2020-01-26: 25 ug/h via INTRAVENOUS

## 2020-01-26 MED ORDER — IOHEXOL 300 MG/ML  SOLN
100.0000 mL | Freq: Once | INTRAMUSCULAR | Status: AC | PRN
  Administered 2020-01-26: 100 mL via INTRAVENOUS

## 2020-01-26 MED ORDER — PROPOFOL 1000 MG/100ML IV EMUL
0.0000 ug/kg/min | INTRAVENOUS | Status: DC
  Administered 2020-01-26: 15 ug/kg/min via INTRAVENOUS
  Administered 2020-01-26: 25 ug/kg/min via INTRAVENOUS
  Administered 2020-01-27: 30 ug/kg/min via INTRAVENOUS
  Filled 2020-01-26 (×3): qty 100

## 2020-01-26 MED ORDER — BACITRACIN ZINC 500 UNIT/GM EX OINT
TOPICAL_OINTMENT | CUTANEOUS | Status: AC
  Filled 2020-01-26: qty 28.35

## 2020-01-26 MED ORDER — IOHEXOL 350 MG/ML SOLN
100.0000 mL | Freq: Once | INTRAVENOUS | Status: AC | PRN
  Administered 2020-01-26: 100 mL via INTRAVENOUS

## 2020-01-26 MED ORDER — 0.9 % SODIUM CHLORIDE (POUR BTL) OPTIME
TOPICAL | Status: DC | PRN
  Administered 2020-01-26: 1000 mL

## 2020-01-26 MED ORDER — ETOMIDATE 2 MG/ML IV SOLN
INTRAVENOUS | Status: AC | PRN
  Administered 2020-01-26: 20 mg via INTRAVENOUS

## 2020-01-26 MED ORDER — PANTOPRAZOLE SODIUM 40 MG IV SOLR
40.0000 mg | Freq: Every day | INTRAVENOUS | Status: DC
  Administered 2020-01-27: 40 mg via INTRAVENOUS
  Filled 2020-01-26: qty 40

## 2020-01-26 MED ORDER — DOCUSATE SODIUM 50 MG/5ML PO LIQD
100.0000 mg | Freq: Two times a day (BID) | ORAL | Status: DC
  Administered 2020-01-26 – 2020-01-27 (×2): 100 mg
  Filled 2020-01-26 (×2): qty 10

## 2020-01-26 MED ORDER — ACETAMINOPHEN 160 MG/5ML PO SOLN
1000.0000 mg | Freq: Four times a day (QID) | ORAL | Status: DC
  Administered 2020-01-26 – 2020-01-27 (×3): 1000 mg
  Filled 2020-01-26 (×3): qty 40.6

## 2020-01-26 MED ORDER — SODIUM CHLORIDE 0.9 % IR SOLN
Status: DC | PRN
  Administered 2020-01-26 (×2): 3000 mL

## 2020-01-26 MED ORDER — ACETAMINOPHEN 500 MG PO TABS: 1000.0000 mg | ORAL_TABLET | Freq: Four times a day (QID) | ORAL | Status: DC

## 2020-01-26 MED ORDER — OXYCODONE HCL 5 MG PO TABS
5.0000 mg | ORAL_TABLET | ORAL | Status: DC | PRN
  Administered 2020-01-27: 10 mg
  Filled 2020-01-26: qty 2

## 2020-01-26 MED ORDER — ONDANSETRON 4 MG PO TBDP: 4.0000 mg | ORAL_TABLET | Freq: Four times a day (QID) | ORAL | Status: DC | PRN

## 2020-01-26 MED ORDER — FENTANYL 2500MCG IN NS 250ML (10MCG/ML) PREMIX INFUSION
25.0000 ug/h | INTRAVENOUS | Status: DC
  Administered 2020-01-26: 50 ug/h via INTRAVENOUS
  Administered 2020-01-27: 100 ug/h via INTRAVENOUS

## 2020-01-26 MED ORDER — FENTANYL CITRATE (PF) 250 MCG/5ML IJ SOLN
INTRAMUSCULAR | Status: AC
  Filled 2020-01-26: qty 5

## 2020-01-26 MED ORDER — MIDAZOLAM 50MG/50ML (1MG/ML) PREMIX INFUSION
0.5000 mg/h | Freq: Once | INTRAVENOUS | Status: AC
  Administered 2020-01-26: 0.5 mg/h via INTRAVENOUS
  Filled 2020-01-26: qty 50

## 2020-01-26 MED ORDER — METOCLOPRAMIDE HCL 5 MG/ML IJ SOLN: 5.0000 mg | Freq: Three times a day (TID) | INTRAMUSCULAR | Status: DC | PRN

## 2020-01-26 MED ORDER — ONDANSETRON HCL 4 MG/2ML IJ SOLN
INTRAMUSCULAR | Status: AC
  Filled 2020-01-26: qty 2

## 2020-01-26 MED ORDER — FENTANYL BOLUS VIA INFUSION
25.0000 ug | INTRAVENOUS | Status: DC | PRN
  Administered 2020-01-26: 25 ug via INTRAVENOUS
  Filled 2020-01-26: qty 25

## 2020-01-26 MED ORDER — FENTANYL CITRATE (PF) 100 MCG/2ML IJ SOLN
25.0000 ug | Freq: Once | INTRAMUSCULAR | Status: AC
  Administered 2020-01-26: 150 ug via INTRAVENOUS
  Administered 2020-01-26: 100 ug via INTRAVENOUS

## 2020-01-26 MED ORDER — ROCURONIUM BROMIDE 50 MG/5ML IV SOLN
INTRAVENOUS | Status: AC | PRN
  Administered 2020-01-26: 100 mg via INTRAVENOUS

## 2020-01-26 MED ORDER — VANCOMYCIN HCL 1000 MG IV SOLR
INTRAVENOUS | Status: AC
  Filled 2020-01-26: qty 1000

## 2020-01-26 MED ORDER — ROCURONIUM BROMIDE 10 MG/ML (PF) SYRINGE
PREFILLED_SYRINGE | INTRAVENOUS | Status: AC
  Filled 2020-01-26: qty 10

## 2020-01-26 MED ORDER — MIDAZOLAM HCL 2 MG/2ML IJ SOLN
INTRAMUSCULAR | Status: AC
  Filled 2020-01-26: qty 2

## 2020-01-26 MED ORDER — PHENYLEPHRINE HCL (PRESSORS) 10 MG/ML IV SOLN
INTRAVENOUS | Status: DC | PRN
  Administered 2020-01-26: 120 ug via INTRAVENOUS

## 2020-01-26 MED ORDER — SODIUM CHLORIDE 0.9 % IV SOLN
2.0000 g | INTRAVENOUS | Status: DC
  Filled 2020-01-26: qty 20

## 2020-01-26 MED ORDER — ROCURONIUM BROMIDE 10 MG/ML (PF) SYRINGE
PREFILLED_SYRINGE | INTRAVENOUS | Status: DC | PRN
  Administered 2020-01-26: 50 mg via INTRAVENOUS

## 2020-01-26 MED ORDER — CEFAZOLIN SODIUM-DEXTROSE 2-3 GM-%(50ML) IV SOLR
INTRAVENOUS | Status: DC | PRN
  Administered 2020-01-26: 2 g via INTRAVENOUS

## 2020-01-26 MED ORDER — PHENYLEPHRINE 40 MCG/ML (10ML) SYRINGE FOR IV PUSH (FOR BLOOD PRESSURE SUPPORT)
PREFILLED_SYRINGE | INTRAVENOUS | Status: AC
  Filled 2020-01-26: qty 10

## 2020-01-26 MED ORDER — PHENYLEPHRINE HCL-NACL 10-0.9 MG/250ML-% IV SOLN
INTRAVENOUS | Status: DC | PRN
  Administered 2020-01-26 (×2): 30 ug/min via INTRAVENOUS

## 2020-01-26 MED ORDER — DOCUSATE SODIUM 100 MG PO CAPS: 100.0000 mg | ORAL_CAPSULE | Freq: Two times a day (BID) | ORAL | Status: DC

## 2020-01-26 MED ORDER — CEFAZOLIN SODIUM 1 G IJ SOLR
INTRAMUSCULAR | Status: AC
  Filled 2020-01-26: qty 20

## 2020-01-26 MED ORDER — CEFAZOLIN SODIUM-DEXTROSE 2-4 GM/100ML-% IV SOLN
2.0000 g | Freq: Once | INTRAVENOUS | Status: AC
  Administered 2020-01-26: 2 g via INTRAVENOUS

## 2020-01-26 MED ORDER — LIDOCAINE 2% (20 MG/ML) 5 ML SYRINGE
INTRAMUSCULAR | Status: AC
  Filled 2020-01-26: qty 5

## 2020-01-26 MED ORDER — METOCLOPRAMIDE HCL 5 MG PO TABS
5.0000 mg | ORAL_TABLET | Freq: Three times a day (TID) | ORAL | Status: DC | PRN
  Filled 2020-01-26: qty 2

## 2020-01-26 MED ORDER — CHLORHEXIDINE GLUCONATE CLOTH 2 % EX PADS
6.0000 | MEDICATED_PAD | Freq: Every day | CUTANEOUS | Status: DC
  Administered 2020-01-26 – 2020-02-15 (×18): 6 via TOPICAL

## 2020-01-26 MED ORDER — LACTATED RINGERS IV SOLN: INTRAVENOUS | Status: DC

## 2020-01-26 MED ORDER — DEXAMETHASONE SODIUM PHOSPHATE 10 MG/ML IJ SOLN
INTRAMUSCULAR | Status: DC | PRN
  Administered 2020-01-26: 5 mg via INTRAVENOUS

## 2020-01-26 SURGICAL SUPPLY — 62 items
APL PRP STRL LF DISP 70% ISPRP (MISCELLANEOUS) ×2
BIT DRILL CALIBRATED 4.2 (BIT) IMPLANT
BIT DRILL SHORT 4.2 (BIT) IMPLANT
BLADE SURG 10 STRL SS (BLADE) ×6 IMPLANT
BNDG COHESIVE 4X5 TAN STRL (GAUZE/BANDAGES/DRESSINGS) ×1 IMPLANT
BNDG ELASTIC 4X5.8 VLCR STR LF (GAUZE/BANDAGES/DRESSINGS) ×3 IMPLANT
BNDG ELASTIC 6X5.8 VLCR STR LF (GAUZE/BANDAGES/DRESSINGS) ×3 IMPLANT
BNDG GAUZE ELAST 4 BULKY (GAUZE/BANDAGES/DRESSINGS) ×1 IMPLANT
BRUSH SCRUB EZ PLAIN DRY (MISCELLANEOUS) ×4 IMPLANT
CHLORAPREP W/TINT 26 (MISCELLANEOUS) ×5 IMPLANT
COVER SURGICAL LIGHT HANDLE (MISCELLANEOUS) ×6 IMPLANT
COVER WAND RF STERILE (DRAPES) ×1 IMPLANT
DRAPE C-ARM 42X72 X-RAY (DRAPES) ×3 IMPLANT
DRAPE C-ARMOR (DRAPES) IMPLANT
DRAPE HALF SHEET 40X57 (DRAPES) IMPLANT
DRAPE IMP U-DRAPE 54X76 (DRAPES) ×2 IMPLANT
DRAPE INCISE IOBAN 66X45 STRL (DRAPES) IMPLANT
DRAPE ORTHO SPLIT 77X108 STRL (DRAPES) ×3
DRAPE SURG ORHT 6 SPLT 77X108 (DRAPES) ×2 IMPLANT
DRAPE U-SHAPE 47X51 STRL (DRAPES) ×1 IMPLANT
DRILL BIT CALIBRATED 4.2 (BIT) ×3
DRILL BIT SHORT 4.2 (BIT) ×6
DRSG ADAPTIC 3X8 NADH LF (GAUZE/BANDAGES/DRESSINGS) IMPLANT
DRSG MEPITEL 3X4 ME34 (GAUZE/BANDAGES/DRESSINGS) ×2 IMPLANT
DRSG MEPITEL 4X7.2 (GAUZE/BANDAGES/DRESSINGS) ×4 IMPLANT
ELECT REM PT RETURN 9FT ADLT (ELECTROSURGICAL) ×3
ELECTRODE REM PT RTRN 9FT ADLT (ELECTROSURGICAL) ×1 IMPLANT
GAUZE SPONGE 4X4 12PLY STRL (GAUZE/BANDAGES/DRESSINGS) IMPLANT
GLOVE BIO SURGEON STRL SZ 6.5 (GLOVE) ×6 IMPLANT
GLOVE BIO SURGEON STRL SZ7.5 (GLOVE) ×12 IMPLANT
GLOVE BIO SURGEONS STRL SZ 6.5 (GLOVE) ×3
GLOVE BIOGEL PI IND STRL 6.5 (GLOVE) ×1 IMPLANT
GLOVE BIOGEL PI IND STRL 7.5 (GLOVE) ×1 IMPLANT
GLOVE BIOGEL PI INDICATOR 6.5 (GLOVE) ×2
GLOVE BIOGEL PI INDICATOR 7.5 (GLOVE) ×2
GOWN STRL REUS W/ TWL LRG LVL3 (GOWN DISPOSABLE) ×2 IMPLANT
GOWN STRL REUS W/TWL LRG LVL3 (GOWN DISPOSABLE) ×6
GUIDEWIRE 3.2X400 (WIRE) ×2 IMPLANT
KIT BASIN OR (CUSTOM PROCEDURE TRAY) ×3 IMPLANT
KIT TURNOVER KIT B (KITS) ×3 IMPLANT
NAIL TIBIAL CANN 10MM PROX 330 (Nail) ×3 IMPLANT
PACK TOTAL JOINT (CUSTOM PROCEDURE TRAY) ×3 IMPLANT
PAD ARMBOARD 7.5X6 YLW CONV (MISCELLANEOUS) ×6 IMPLANT
PADDING CAST ABS 4INX4YD NS (CAST SUPPLIES) ×2
PADDING CAST ABS COTTON 4X4 ST (CAST SUPPLIES) IMPLANT
PADDING CAST COTTON 6X4 STRL (CAST SUPPLIES) ×2 IMPLANT
REAMER ROD DEEP FLUTE 2.5X950 (INSTRUMENTS) ×2 IMPLANT
SCREW LOCK STAR 5X32 (Screw) ×2 IMPLANT
SCREW LOCK STAR 5X36 (Screw) ×3 IMPLANT
SCREW LOCK STAR 5X54 (Screw) ×2 IMPLANT
SCREW LOCK STAR 5X60 (Screw) ×2 IMPLANT
STAPLER VISISTAT 35W (STAPLE) ×1 IMPLANT
SUT ETHILON 3 0 PS 1 (SUTURE) ×9 IMPLANT
SUT MNCRL AB 3-0 PS2 18 (SUTURE) ×1 IMPLANT
SUT MON AB 2-0 CT1 36 (SUTURE) ×6 IMPLANT
SUT VIC AB 0 CT1 27 (SUTURE) ×6
SUT VIC AB 0 CT1 27XBRD ANBCTR (SUTURE) ×2 IMPLANT
SUT VIC AB 2-0 CT1 27 (SUTURE)
SUT VIC AB 2-0 CT1 TAPERPNT 27 (SUTURE) IMPLANT
TOWEL GREEN STERILE (TOWEL DISPOSABLE) ×6 IMPLANT
TOWEL GREEN STERILE FF (TOWEL DISPOSABLE) ×3 IMPLANT
YANKAUER SUCT BULB TIP NO VENT (SUCTIONS) IMPLANT
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
## 2020-01-26 NOTE — Anesthesia Preprocedure Evaluation (Signed)
Anesthesia Evaluation   Patient unresponsive    Reviewed: Unable to perform ROS - Chart review onlyPreop documentation limited or incomplete due to emergent nature of procedure.  Airway Mallampati: Intubated       Dental   Pulmonary           Cardiovascular      Neuro/Psych    GI/Hepatic   Endo/Other    Renal/GU      Musculoskeletal   Abdominal   Peds  Hematology   Anesthesia Other Findings   Reproductive/Obstetrics                             Anesthesia Physical Anesthesia Plan  ASA: II and emergent  Anesthesia Plan: General   Post-op Pain Management:    Induction: Intravenous  PONV Risk Score and Plan: 2 and Treatment may vary due to age or medical condition  Airway Management Planned: Oral ETT  Additional Equipment: Arterial line  Intra-op Plan:   Post-operative Plan: Post-operative intubation/ventilation  Informed Consent:     Only emergency history available and History available from chart only  Plan Discussed with: CRNA and Surgeon  Anesthesia Plan Comments:         Anesthesia Quick Evaluation
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
## 2020-01-26 NOTE — Transfer of Care (Signed)
Immediate Anesthesia Transfer of Care Note  Patient: Christopher Marks  Procedure(s) Performed: INTRAMEDULLARY (IM) NAIL TIBIAL WITH IRRIGATION AND DEBRIDMENT OF OPEN FRACTURE. (Right Leg Lower)  Patient Location: PACU  Anesthesia Type:General  Level of Consciousness: Patient remains intubated per anesthesia plan  Airway & Oxygen Therapy: Patient remains intubated per anesthesia plan and Patient placed on Ventilator (see vital sign flow sheet for setting)  Post-op Assessment: Report given to RN and Post -op Vital signs reviewed and stable  Post vital signs: Reviewed and stable  Last Vitals:  Vitals Value Taken Time  BP 117/60 01/26/20 1140  Temp 36.7 C 01/26/20 1145  Pulse 90 01/26/20 1145  Resp 23 01/26/20 1145  SpO2 100 % 01/26/20 1145  Vitals shown include unvalidated device data.  Last Pain: There were no vitals filed for this visit.       Complications: No complications documented.
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
## 2020-01-26 NOTE — ED Notes (Signed)
Ortho at bedside.

## 2020-01-26 NOTE — ED Notes (Signed)
[employer] officer S.W.Jarold Motto - [phone] would like to be notified when pt is out of surgery.
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
## 2020-01-26 NOTE — Progress Notes (Signed)
Orthopedic Tech Progress Note Patient Details:  Christopher Marks 1990/06/02 [id number]  Ortho Devices Type of Ortho Device: Short leg splint, Stirrup splint Ortho Device/Splint Location: RLE Ortho Device/Splint Interventions: Application   Post Interventions Patient Tolerated: Well   [name] 01/26/2020, 5:45 AM
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
## 2020-01-26 NOTE — Anesthesia Procedure Notes (Signed)
Arterial Line Insertion Start/End10/14/2021 10:21 AM, 01/26/2020 10:23 AM Performed by: Epifanio Lesches, CRNA, CRNA  Patient location: OR. Preanesthetic checklist: patient identified, IV checked, site marked, risks and benefits discussed, surgical consent, monitors and equipment checked, pre-op evaluation, timeout performed and anesthesia consent Patient sedated Left, radial was placed Catheter size: 20 G Hand hygiene performed  and maximum sterile barriers used  Allen's test indicative of satisfactory collateral circulation Attempts: 2 Procedure performed without using ultrasound guided technique. Ultrasound Notes:anatomy identified Following insertion, dressing applied and Biopatch. Post procedure assessment: normal  Patient tolerated the procedure well with no immediate complications.
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
## 2020-01-27 LAB — CBC
HCT: 39.3 % (ref 39.0–52.0)
HCT: 37 % — ABNORMAL LOW (ref 39.0–52.0)
Hemoglobin: 12.9 g/dL — ABNORMAL LOW (ref 13.0–17.0)
Hemoglobin: 12.2 g/dL — ABNORMAL LOW (ref 13.0–17.0)
MCH: 27.4 pg (ref 26.0–34.0)
MCH: 27.9 pg (ref 26.0–34.0)
MCHC: 32.8 g/dL (ref 30.0–36.0)
MCHC: 33 g/dL (ref 30.0–36.0)
MCV: 83.4 fL (ref 80.0–100.0)
MCV: 84.5 fL (ref 80.0–100.0)
Platelets: 190 10*3/uL (ref 150–400)
Platelets: 176 10*3/uL (ref 150–400)
RBC: 4.71 MIL/uL (ref 4.22–5.81)
RBC: 4.38 MIL/uL (ref 4.22–5.81)
RDW: 13.9 % (ref 11.5–15.5)
RDW: 14.2 % (ref 11.5–15.5)
WBC: 14.9 10*3/uL — ABNORMAL HIGH (ref 4.0–10.5)
WBC: 16.6 10*3/uL — ABNORMAL HIGH (ref 4.0–10.5)
nRBC: 0.2 % (ref 0.0–0.2)
nRBC: 0.4 % — ABNORMAL HIGH (ref 0.0–0.2)

## 2020-01-27 LAB — MAGNESIUM: Magnesium: 2.2 mg/dL (ref 1.7–2.4)

## 2020-01-27 LAB — COMPREHENSIVE METABOLIC PANEL
ALT: 7090 U/L — ABNORMAL HIGH (ref 0–44)
AST: >10000 U/L — ABNORMAL HIGH (ref 15–41)
Albumin: 3.5 g/dL (ref 3.5–5.0)
Alkaline Phosphatase: 208 U/L — ABNORMAL HIGH (ref 38–126)
Anion gap: 10 (ref 5–15)
BUN: 20 mg/dL (ref 6–20)
CO2: 21 mmol/L — ABNORMAL LOW (ref 22–32)
Calcium: 8.4 mg/dL — ABNORMAL LOW (ref 8.9–10.3)
Chloride: 109 mmol/L (ref 98–111)
Creatinine, Ser: 1.55 mg/dL — ABNORMAL HIGH (ref 0.61–1.24)
GFR, Estimated: >60 mL/min (ref >60)
Glucose, Bld: 126 mg/dL — ABNORMAL HIGH (ref 70–99)
Potassium: 4.1 mmol/L (ref 3.5–5.1)
Sodium: 140 mmol/L (ref 135–145)
Total Bilirubin: 3.6 mg/dL — ABNORMAL HIGH (ref 0.3–1.2)
Total Protein: 6 g/dL — ABNORMAL LOW (ref 6.5–8.1)

## 2020-01-27 LAB — TYPE AND SCREEN
ABO/RH(D): O POS
Antibody Screen: NEGATIVE
Unit division: 0
Unit division: 0

## 2020-01-27 LAB — BPAM RBC
Blood Product Expiration Date: 202110262359
Blood Product Expiration Date: 202111052359
ISSUE DATE / TIME: 202110140544
ISSUE DATE / TIME: 202110140544
Unit Type and Rh: 5100
Unit Type and Rh: 5100

## 2020-01-27 LAB — PHOSPHORUS: Phosphorus: 4.7 mg/dL — ABNORMAL HIGH (ref 2.5–4.6)

## 2020-01-27 LAB — BASIC METABOLIC PANEL
Anion gap: 12 (ref 5–15)
BUN: 18 mg/dL (ref 6–20)
CO2: 18 mmol/L — ABNORMAL LOW (ref 22–32)
Calcium: 8.7 mg/dL — ABNORMAL LOW (ref 8.9–10.3)
Chloride: 110 mmol/L (ref 98–111)
Creatinine, Ser: 1.39 mg/dL — ABNORMAL HIGH (ref 0.61–1.24)
GFR, Estimated: >60 mL/min (ref >60)
Glucose, Bld: 121 mg/dL — ABNORMAL HIGH (ref 70–99)
Potassium: 4.1 mmol/L (ref 3.5–5.1)
Sodium: 140 mmol/L (ref 135–145)

## 2020-01-27 LAB — BLOOD PRODUCT ORDER (VERBAL) VERIFICATION

## 2020-01-27 LAB — TRIGLYCERIDES: Triglycerides: 401 mg/dL — ABNORMAL HIGH (ref <150)

## 2020-01-27 LAB — VITAMIN D 25 HYDROXY (VIT D DEFICIENCY, FRACTURES): Vit D, 25-Hydroxy: 5.02 ng/mL — ABNORMAL LOW (ref 30–100)

## 2020-01-27 MED ORDER — VITAMIN D 25 MCG (1000 UNIT) PO TABS
2000.0000 [IU] | ORAL_TABLET | Freq: Two times a day (BID) | ORAL | Status: DC
  Administered 2020-01-27 – 2020-01-30 (×5): 2000 [IU] via ORAL
  Filled 2020-01-27 (×6): qty 2

## 2020-01-27 MED ORDER — LORAZEPAM 2 MG/ML IJ SOLN: 1.0000 mg | INTRAMUSCULAR | Status: AC | PRN

## 2020-01-27 MED ORDER — ACETAMINOPHEN 160 MG/5ML PO SOLN
1000.0000 mg | Freq: Four times a day (QID) | ORAL | Status: DC
  Administered 2020-01-27 – 2020-01-28 (×3): 1000 mg via ORAL
  Filled 2020-01-27 (×3): qty 40.6

## 2020-01-27 MED ORDER — VITAMIN D (ERGOCALCIFEROL) 1.25 MG (50000 UNIT) PO CAPS
50000.0000 [IU] | ORAL_CAPSULE | ORAL | Status: DC
  Administered 2020-01-27: 50000 [IU] via ORAL
  Filled 2020-01-27 (×2): qty 1

## 2020-01-27 MED ORDER — THIAMINE HCL 100 MG/ML IJ SOLN
100.0000 mg | Freq: Every day | INTRAMUSCULAR | Status: DC
  Administered 2020-01-27 – 2020-01-29 (×2): 100 mg via INTRAVENOUS
  Filled 2020-01-27 (×2): qty 2

## 2020-01-27 MED ORDER — OXYCODONE HCL 5 MG PO TABS
5.0000 mg | ORAL_TABLET | ORAL | Status: DC | PRN
  Administered 2020-01-27: 5 mg via ORAL
  Administered 2020-01-28 – 2020-01-29 (×4): 10 mg via ORAL
  Filled 2020-01-27: qty 1
  Filled 2020-01-27 (×4): qty 2

## 2020-01-27 MED ORDER — ADULT MULTIVITAMIN W/MINERALS CH
1.0000 | ORAL_TABLET | Freq: Every day | ORAL | Status: DC
  Administered 2020-01-27 – 2020-01-29 (×3): 1 via ORAL
  Filled 2020-01-27 (×3): qty 1

## 2020-01-27 MED ORDER — DOCUSATE SODIUM 100 MG PO CAPS
100.0000 mg | ORAL_CAPSULE | Freq: Two times a day (BID) | ORAL | Status: DC
  Administered 2020-01-27 – 2020-01-29 (×4): 100 mg via ORAL
  Filled 2020-01-27 (×5): qty 1

## 2020-01-27 MED ORDER — ENOXAPARIN SODIUM 30 MG/0.3ML ~~LOC~~ SOLN
30.0000 mg | Freq: Two times a day (BID) | SUBCUTANEOUS | Status: DC
  Filled 2020-01-27: qty 0.3

## 2020-01-27 MED ORDER — ENOXAPARIN SODIUM 30 MG/0.3ML ~~LOC~~ SOLN
30.0000 mg | Freq: Two times a day (BID) | SUBCUTANEOUS | Status: DC
  Administered 2020-01-28 – 2020-01-30 (×4): 30 mg via SUBCUTANEOUS
  Filled 2020-01-27 (×4): qty 0.3

## 2020-01-27 MED ORDER — THIAMINE HCL 100 MG PO TABS
100.0000 mg | ORAL_TABLET | Freq: Every day | ORAL | Status: DC
  Administered 2020-01-28: 100 mg via ORAL
  Filled 2020-01-27: qty 1

## 2020-01-27 MED ORDER — METOPROLOL TARTRATE 5 MG/5ML IV SOLN
5.0000 mg | Freq: Four times a day (QID) | INTRAVENOUS | Status: DC
  Administered 2020-01-27 – 2020-02-03 (×24): 5 mg via INTRAVENOUS
  Filled 2020-01-27 (×24): qty 5

## 2020-01-27 MED ORDER — LORAZEPAM 1 MG PO TABS: 1.0000 mg | ORAL_TABLET | ORAL | Status: AC | PRN

## 2020-01-27 MED ORDER — FOLIC ACID 1 MG PO TABS
1.0000 mg | ORAL_TABLET | Freq: Every day | ORAL | Status: DC
  Administered 2020-01-27 – 2020-01-29 (×3): 1 mg via ORAL
  Filled 2020-01-27 (×3): qty 1

## 2020-01-27 MED ORDER — ORAL CARE MOUTH RINSE
15.0000 mL | Freq: Two times a day (BID) | OROMUCOSAL | Status: DC
  Administered 2020-01-27 – 2020-02-01 (×7): 15 mL via OROMUCOSAL
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
## 2020-01-27 NOTE — Progress Notes (Signed)
Patient retaining urine, bladder scanner showed approximately 400cc of urine. Explained the I/O process to the patient and during the process, he became extremely agitated and screaming at me. I removed the catheter without being able to get any urine return.   Patient yelling at me to leave and he wants to go home now. I explained his status and still wants to leave. I explained the AMA and agrees he wants me to call the MD.   Paged the trauma MD- understands situation, attempt to hold on the [facility] paperwork.
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
## 2020-01-27 NOTE — Progress Notes (Signed)
Inpatient Rehab Admissions Coordinator Note:   Per PT/OT recommendations, pt was screened for CIR candidacy by Wolfgang Phoenix, MS, CCC-SLP.  At this time we are recommending an inpatient rehab consult.  AC will place consult order per protocol.   Please contact me with questions.    Wolfgang Phoenix, MS, CCC-SLP Admissions Coordinator [phone] 01/27/20 3:35 PM

## 2020-01-27 NOTE — Anesthesia Postprocedure Evaluation (Signed)
Anesthesia Post Note  Patient: Christopher Marks  Procedure(s) Performed: INTRAMEDULLARY (IM) NAIL TIBIAL WITH IRRIGATION AND DEBRIDMENT OF OPEN FRACTURE. (Right Leg Lower)     Patient location during evaluation: SICU Anesthesia Type: General Level of consciousness: sedated Pain management: pain level controlled Vital Signs Assessment: post-procedure vital signs reviewed and stable Respiratory status: patient remains intubated per anesthesia plan Cardiovascular status: stable Postop Assessment: no apparent nausea or vomiting Anesthetic complications: no   No complications documented.  Last Vitals:  Vitals:   01/27/20 0900 01/27/20 0936  BP: (!) 150/94   Pulse: 88 100  Resp: 17 (!) 24  Temp: 37.8 C (!) 38 C  SpO2: 96% 94%    Last Pain:  Vitals:   01/27/20 0800  TempSrc: Bladder                 Darrion Wyszynski

## 2020-01-27 NOTE — Procedures (Signed)
Extubation Procedure Note  Patient Details:   Name: Christopher Marks DOB: 12-08-1990 MRN: [id number]   Airway Documentation:    Vent end date: 01/27/20 Vent end time: 0938   Evaluation  O2 sats:96% on [facility]  Complications:No complications not post extubation. No stridor noted pt alert an talking asking for something to drink. Patient tolerated procedure well. Bilateral Breath Sounds: Diminished, Clear     Christopher Marks 01/27/2020, 9:38 AM
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
## 2020-01-28 ENCOUNTER — Inpatient Hospital Stay (HOSPITAL_COMMUNITY): Payer: No Typology Code available for payment source

## 2020-01-28 LAB — CBC
HCT: 32.6 % — ABNORMAL LOW (ref 39.0–52.0)
Hemoglobin: 10.8 g/dL — ABNORMAL LOW (ref 13.0–17.0)
MCH: 29 pg (ref 26.0–34.0)
MCHC: 33.1 g/dL (ref 30.0–36.0)
MCV: 87.6 fL (ref 80.0–100.0)
Platelets: 143 10*3/uL — ABNORMAL LOW (ref 150–400)
RBC: 3.72 MIL/uL — ABNORMAL LOW (ref 4.22–5.81)
RDW: 14.5 % (ref 11.5–15.5)
WBC: 17.2 10*3/uL — ABNORMAL HIGH (ref 4.0–10.5)
nRBC: 33.9 % — ABNORMAL HIGH (ref 0.0–0.2)

## 2020-01-28 LAB — BASIC METABOLIC PANEL
Anion gap: 8 (ref 5–15)
BUN: 24 mg/dL — ABNORMAL HIGH (ref 6–20)
CO2: 22 mmol/L (ref 22–32)
Calcium: 8.3 mg/dL — ABNORMAL LOW (ref 8.9–10.3)
Chloride: 106 mmol/L (ref 98–111)
Creatinine, Ser: 1.38 mg/dL — ABNORMAL HIGH (ref 0.61–1.24)
GFR, Estimated: >60 mL/min (ref >60)
Glucose, Bld: 105 mg/dL — ABNORMAL HIGH (ref 70–99)
Potassium: 4.2 mmol/L (ref 3.5–5.1)
Sodium: 136 mmol/L (ref 135–145)

## 2020-01-28 LAB — MAGNESIUM: Magnesium: 2.1 mg/dL (ref 1.7–2.4)

## 2020-01-28 LAB — PHOSPHORUS: Phosphorus: 2 mg/dL — ABNORMAL LOW (ref 2.5–4.6)
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
## 2020-01-28 MED ORDER — ACETAMINOPHEN 325 MG PO TABS
650.0000 mg | ORAL_TABLET | Freq: Four times a day (QID) | ORAL | Status: DC | PRN
  Administered 2020-01-28 – 2020-01-29 (×3): 650 mg via ORAL
  Filled 2020-01-28 (×4): qty 2

## 2020-01-28 MED ORDER — SIMETHICONE 80 MG PO CHEW
80.0000 mg | CHEWABLE_TABLET | Freq: Four times a day (QID) | ORAL | Status: DC | PRN
  Administered 2020-01-29: 80 mg via ORAL
  Filled 2020-01-28: qty 1

## 2020-01-28 NOTE — Progress Notes (Signed)
Pt O2 dropped to low 80s while this nurse was in room. Placed pt on NRB and assessed abdomen which was more firm than this morning. Contacted Dr. Andrey Campanile who gave verbal for acute abdominal series.
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
## 2020-01-28 NOTE — Progress Notes (Signed)
Attempted to get pt for MRI exam, per RN pt is on high flow oxygen and is so much that the wall unit setup cannot provide enough for pt to come down to MRI at this time and to check back later.

## 2020-01-28 NOTE — Progress Notes (Signed)
MD notified - Patient continues to complain of abdominal pain. Abd distended, taut, and tender.  Developed 102 fever, tachycardic, and requiring additional 02. Salter added instead of nasal cannula.   Fever has since decreased.  Area of bladder still distended, multiple attempts to use urinal on his own and unable. I/O cath completed and was able to get over 750cc of tea colored urine (cloudy & sediment).  HR now in 1-teens and 02 reading better in the 90s.
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
## 2020-01-28 NOTE — Progress Notes (Signed)
Asked to see pt due to O2 reqmt & abd distension  Not really participating with pulm toilet Resting comfortably  Non labored breathing, no accessory muscles cta b/l Pt not really cooperating with IS, refuses to hold IS - "states he is going to suck no dick" abd is distended, not firm but not  Soft  Acute abd series reviewed No free air Low lung volumes O2 sat is ok on high flow  Will try flutter valve Expect pt is developing sb distension given SB mesenteric hematomas.  Will try some gas x Discussed ramifications of not doing pulm exercises I dont think he is hypercarbic   Repeat labs in am - monitor CBC  Mary Sella. Andrey Campanile, MD, FACS General, Bariatric, & Minimally Invasive Surgery [facility], [place]

## 2020-01-28 NOTE — Progress Notes (Signed)
Inpatient Rehab Admissions:  Inpatient Rehab Consult received.  I met with patient at the bedside for rehabilitation assessment and to discuss goals and expectations of an inpatient rehab admission.  Pt is not interested in receiving therapy at CIR.  Will make TOC aware.  Will sign off on this pt.  Signed: Gayland Curry, [place], [place] Admissions Coordinator [phone]

## 2020-01-28 NOTE — Progress Notes (Signed)
Pt refusing to maintain NRB, placed on salter
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
## 2020-01-29 ENCOUNTER — Encounter (HOSPITAL_COMMUNITY): Payer: Self-pay | Admitting: Student

## 2020-01-29 ENCOUNTER — Inpatient Hospital Stay (HOSPITAL_COMMUNITY): Payer: No Typology Code available for payment source

## 2020-01-29 LAB — CBC
HCT: 27 % — ABNORMAL LOW (ref 39.0–52.0)
Hemoglobin: 8.9 g/dL — ABNORMAL LOW (ref 13.0–17.0)
MCH: 29.5 pg (ref 26.0–34.0)
MCHC: 33 g/dL (ref 30.0–36.0)
MCV: 89.4 fL (ref 80.0–100.0)
Platelets: 121 10*3/uL — ABNORMAL LOW (ref 150–400)
RBC: 3.02 MIL/uL — ABNORMAL LOW (ref 4.22–5.81)
RDW: 14.3 % (ref 11.5–15.5)
WBC: 23.9 10*3/uL — ABNORMAL HIGH (ref 4.0–10.5)
nRBC: 47.5 % — ABNORMAL HIGH (ref 0.0–0.2)

## 2020-01-29 LAB — COMPREHENSIVE METABOLIC PANEL
ALT: 3736 U/L — ABNORMAL HIGH (ref 0–44)
AST: 4976 U/L — ABNORMAL HIGH (ref 15–41)
Albumin: 2.6 g/dL — ABNORMAL LOW (ref 3.5–5.0)
Alkaline Phosphatase: 227 U/L — ABNORMAL HIGH (ref 38–126)
Anion gap: 7 (ref 5–15)
BUN: 25 mg/dL — ABNORMAL HIGH (ref 6–20)
CO2: 24 mmol/L (ref 22–32)
Calcium: 8.4 mg/dL — ABNORMAL LOW (ref 8.9–10.3)
Chloride: 107 mmol/L (ref 98–111)
Creatinine, Ser: 1.45 mg/dL — ABNORMAL HIGH (ref 0.61–1.24)
GFR, Estimated: >60 mL/min (ref >60)
Glucose, Bld: 130 mg/dL — ABNORMAL HIGH (ref 70–99)
Potassium: 4.3 mmol/L (ref 3.5–5.1)
Sodium: 138 mmol/L (ref 135–145)
Total Bilirubin: 11 mg/dL — ABNORMAL HIGH (ref 0.3–1.2)
Total Protein: 5.3 g/dL — ABNORMAL LOW (ref 6.5–8.1)

## 2020-01-29 LAB — MAGNESIUM: Magnesium: 2.3 mg/dL (ref 1.7–2.4)

## 2020-01-29 LAB — PHOSPHORUS
Phosphorus: 1.3 mg/dL — ABNORMAL LOW (ref 2.5–4.6)
Phosphorus: 4.7 mg/dL — ABNORMAL HIGH (ref 2.5–4.6)

## 2020-01-29 LAB — EXPECTORATED SPUTUM ASSESSMENT W GRAM STAIN, RFLX TO RESP C
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
## 2020-01-29 MED ORDER — DEXTROSE-NACL 5-0.45 % IV SOLN: INTRAVENOUS | Status: DC

## 2020-01-29 MED ORDER — DEXMEDETOMIDINE HCL IN NACL 400 MCG/100ML IV SOLN
0.4000 ug/kg/h | INTRAVENOUS | Status: DC
  Administered 2020-01-29 – 2020-01-30 (×2): 0.4 ug/kg/h via INTRAVENOUS
  Administered 2020-01-30: 0.5 ug/kg/h via INTRAVENOUS
  Administered 2020-01-31: 0.4 ug/kg/h via INTRAVENOUS
  Administered 2020-01-31: 0.5 ug/kg/h via INTRAVENOUS
  Administered 2020-02-01 (×3): 0.4 ug/kg/h via INTRAVENOUS
  Administered 2020-02-03: 0.8 ug/kg/h via INTRAVENOUS
  Administered 2020-02-03: 0.6 ug/kg/h via INTRAVENOUS
  Administered 2020-02-04: 0.8 ug/kg/h via INTRAVENOUS
  Administered 2020-02-04: 0.4 ug/kg/h via INTRAVENOUS
  Administered 2020-02-04: 0.7 ug/kg/h via INTRAVENOUS
  Administered 2020-02-05: 0.8 ug/kg/h via INTRAVENOUS
  Administered 2020-02-05 (×2): 0.6 ug/kg/h via INTRAVENOUS
  Administered 2020-02-06: 0.7 ug/kg/h via INTRAVENOUS
  Filled 2020-01-29 (×16): qty 100

## 2020-01-29 MED ORDER — ACETAMINOPHEN 10 MG/ML IV SOLN
1000.0000 mg | Freq: Once | INTRAVENOUS | Status: AC
  Administered 2020-01-29: 1000 mg via INTRAVENOUS
  Filled 2020-01-29: qty 100

## 2020-01-29 MED ORDER — SODIUM PHOSPHATES 45 MMOLE/15ML IV SOLN
45.0000 mmol | Freq: Once | INTRAVENOUS | Status: AC
  Administered 2020-01-29: 45 mmol via INTRAVENOUS
  Filled 2020-01-29: qty 15

## 2020-01-29 MED ORDER — PIPERACILLIN-TAZOBACTAM 3.375 G IVPB
3.3750 g | Freq: Three times a day (TID) | INTRAVENOUS | Status: DC
  Administered 2020-01-29 – 2020-02-06 (×26): 3.375 g via INTRAVENOUS
  Filled 2020-01-29 (×26): qty 50

## 2020-01-29 MED ORDER — PIPERACILLIN-TAZOBACTAM 3.375 G IVPB 30 MIN
3.3750 g | Freq: Once | INTRAVENOUS | Status: AC
  Administered 2020-01-29: 3.375 g via INTRAVENOUS
  Filled 2020-01-29 (×2): qty 50

## 2020-01-29 MED ORDER — POTASSIUM PHOSPHATES 15 MMOLE/5ML IV SOLN
45.0000 mmol | Freq: Once | INTRAVENOUS | Status: DC
  Filled 2020-01-29: qty 15

## 2020-01-29 NOTE — Progress Notes (Signed)
Patient repeatedly moaning and yelling loudly. When this nurse enters room, patient stops and refuses to answer when questioned about what is wrong and how I can help.
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
## 2020-01-29 NOTE — Progress Notes (Signed)
Inpatient Rehab Admissions:  Inpatient Rehab Consult received.  I met with patient and grandmother at the bedside for rehabilitation assessment and to discuss goals and expectations of an inpatient rehab admission.  Pt lethargic so explained CIR goals and expectations to grandmother, Joann Slade.  She acknowledged understanding of goals and expectations. She would like to pursue CIR for pt.  Pt appears to be an appropriate candidate.  Also contacted financial counseling, Jessica Wright, to speak with pt and family.  Will continue to follow pt's progress with therapies and medical workup.  Signed:  Graves Madden, MS, CCC-SLP Admissions Coordinator [phone]   
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
## 2020-01-29 NOTE — Progress Notes (Signed)
Pt's O2 needs are still too high to be met in MRI. Will attempt later. RN aware.
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
## 2020-01-30 ENCOUNTER — Inpatient Hospital Stay (HOSPITAL_COMMUNITY): Payer: No Typology Code available for payment source

## 2020-01-30 ENCOUNTER — Inpatient Hospital Stay: Payer: Self-pay

## 2020-01-30 DIAGNOSIS — T1490XA Injury, unspecified, initial encounter: Secondary | ICD-10-CM

## 2020-01-30 LAB — BASIC METABOLIC PANEL
Anion gap: 12 (ref 5–15)
BUN: 47 mg/dL — ABNORMAL HIGH (ref 6–20)
CO2: 22 mmol/L (ref 22–32)
Calcium: 8.2 mg/dL — ABNORMAL LOW (ref 8.9–10.3)
Chloride: 109 mmol/L (ref 98–111)
Creatinine, Ser: 2.12 mg/dL — ABNORMAL HIGH (ref 0.61–1.24)
GFR, Estimated: 41 mL/min — ABNORMAL LOW (ref >60)
Glucose, Bld: 125 mg/dL — ABNORMAL HIGH (ref 70–99)
Potassium: 4.4 mmol/L (ref 3.5–5.1)
Sodium: 143 mmol/L (ref 135–145)

## 2020-01-30 LAB — PROTIME-INR
INR: 1.2 (ref 0.8–1.2)
Prothrombin Time: 14.9 seconds (ref 11.4–15.2)

## 2020-01-30 LAB — HEPATIC FUNCTION PANEL
ALT: 2380 U/L — ABNORMAL HIGH (ref 0–44)
AST: 5171 U/L — ABNORMAL HIGH (ref 15–41)
Albumin: 2.5 g/dL — ABNORMAL LOW (ref 3.5–5.0)
Alkaline Phosphatase: 170 U/L — ABNORMAL HIGH (ref 38–126)
Bilirubin, Direct: 8.5 mg/dL — ABNORMAL HIGH (ref 0.0–0.2)
Indirect Bilirubin: 6.3 mg/dL — ABNORMAL HIGH (ref 0.3–0.9)
Total Bilirubin: 14.8 mg/dL — ABNORMAL HIGH (ref 0.3–1.2)
Total Protein: 5.7 g/dL — ABNORMAL LOW (ref 6.5–8.1)

## 2020-01-30 LAB — CBC
HCT: 23.4 % — ABNORMAL LOW (ref 39.0–52.0)
Hemoglobin: 7.4 g/dL — ABNORMAL LOW (ref 13.0–17.0)
MCH: 29 pg (ref 26.0–34.0)
MCHC: 31.6 g/dL (ref 30.0–36.0)
MCV: 91.8 fL (ref 80.0–100.0)
Platelets: 124 10*3/uL — ABNORMAL LOW (ref 150–400)
RBC: 2.55 MIL/uL — ABNORMAL LOW (ref 4.22–5.81)
RDW: 14.1 % (ref 11.5–15.5)
WBC: 24.6 10*3/uL — ABNORMAL HIGH (ref 4.0–10.5)
nRBC: 38.7 % — ABNORMAL HIGH (ref 0.0–0.2)

## 2020-01-30 LAB — MAGNESIUM: Magnesium: 2.8 mg/dL — ABNORMAL HIGH (ref 1.7–2.4)

## 2020-01-30 LAB — PHOSPHORUS: Phosphorus: 4.3 mg/dL (ref 2.5–4.6)

## 2020-01-30 LAB — URINE CULTURE: Culture: NO GROWTH

## 2020-01-30 LAB — AMMONIA: Ammonia: 53 umol/L — ABNORMAL HIGH (ref 9–35)

## 2020-01-30 LAB — PATHOLOGIST SMEAR REVIEW
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
## 2020-01-30 IMAGING — DX DG ABD PORTABLE 1V
1 series · 1 of 1 positions shown · non-contrast
Comparison: None.

CLINICAL DATA: Status post NG tube placement.

EXAM:
PORTABLE ABDOMEN - 1 VIEW

[abdomen kub]
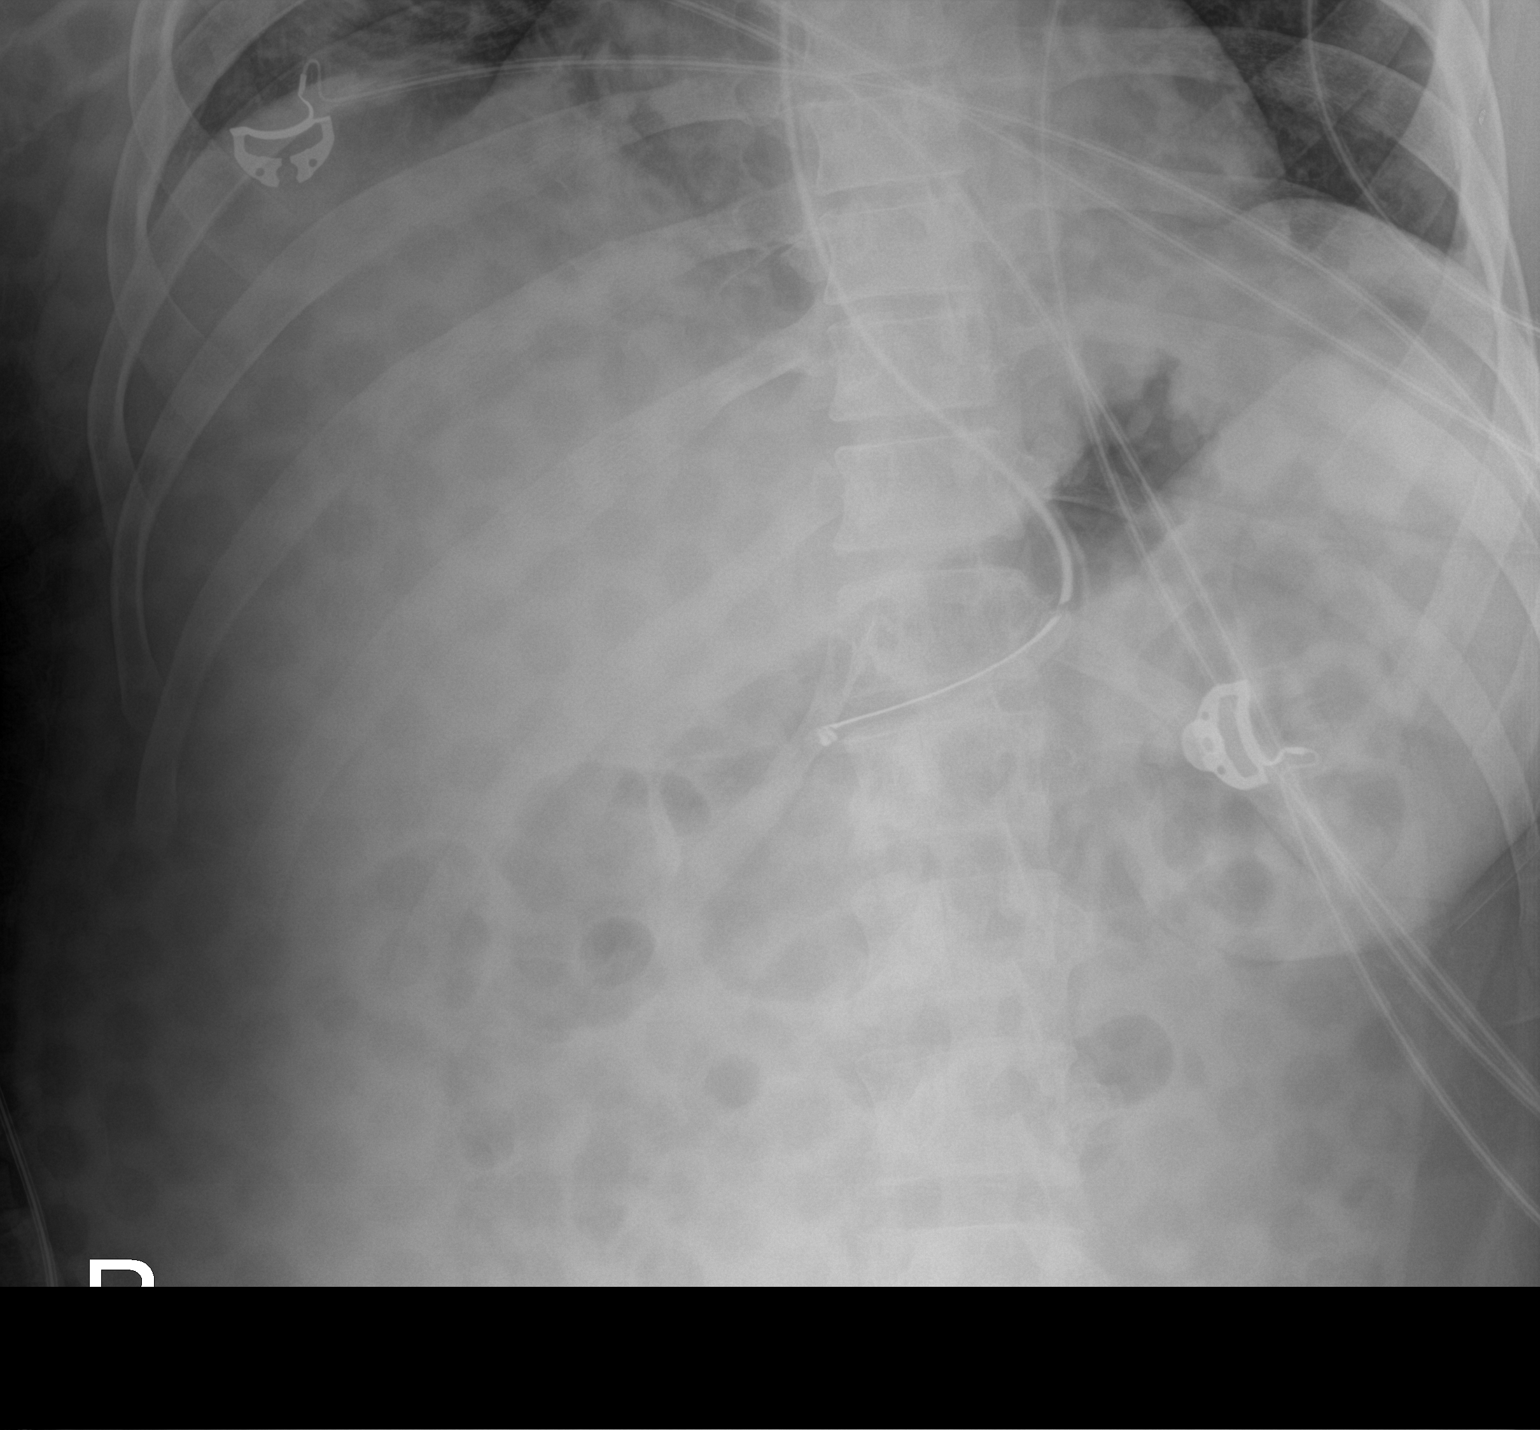

[1 of 1 positions shown; findings below may reference images not displayed]

FINDINGS: NG tube is in good position both the tip and side-port in the
stomach.
IMPRESSION: As above.

## 2020-01-30 IMAGING — DX DG ABD PORTABLE 1V
1 series · 1 of 1 positions shown · non-contrast
Comparison: [DATE]

CLINICAL DATA: NG placement

EXAM:
PORTABLE ABDOMEN - 1 VIEW

[abdomen kub]
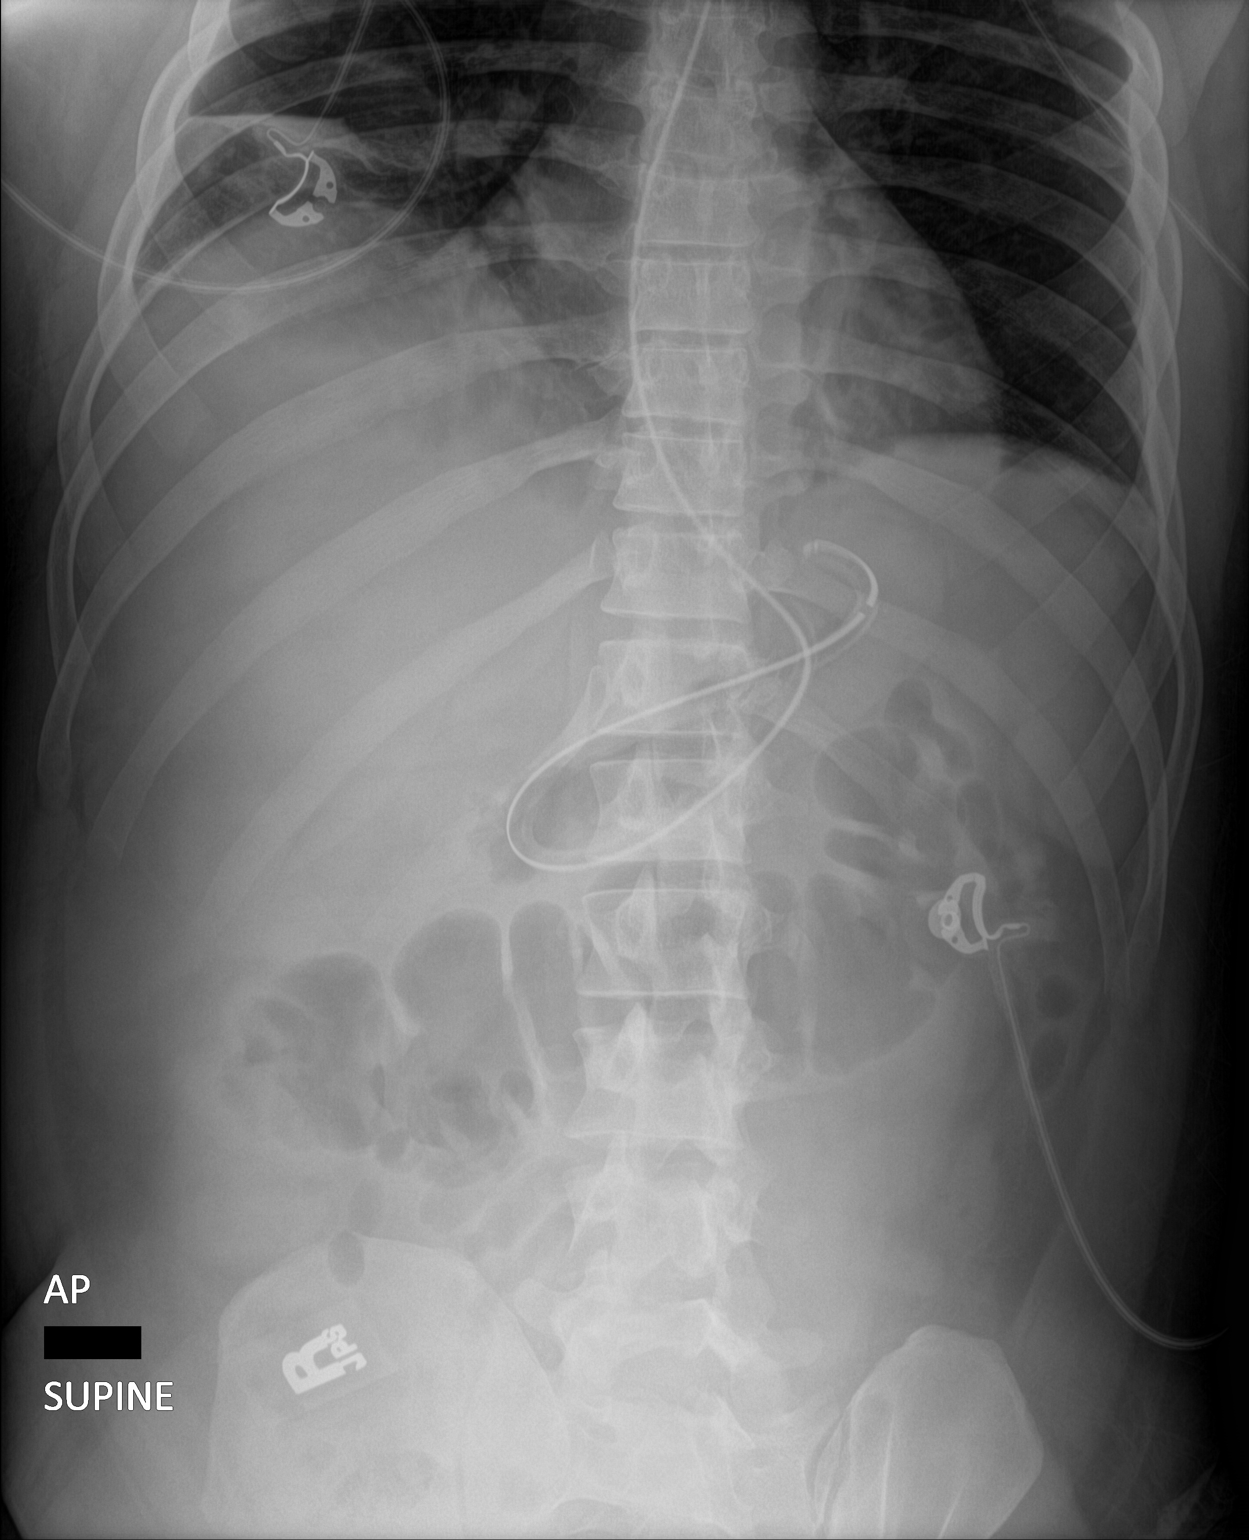

[1 of 1 positions shown; findings below may reference images not displayed]

FINDINGS: NG has been advanced and is now coiled in the stomach with the tip
in the fundus. Normal bowel gas pattern.

Right lower lobe airspace disease
IMPRESSION: Feeding tube coiled in the stomach with the tip in the fundus.

## 2020-01-30 IMAGING — DX DG ABD PORTABLE 1V
1 series · 1 of 1 positions shown · non-contrast
Comparison: [DATE]

CLINICAL DATA: NG tube placement

EXAM:
PORTABLE ABDOMEN - 1 VIEW

[abdomen kub]
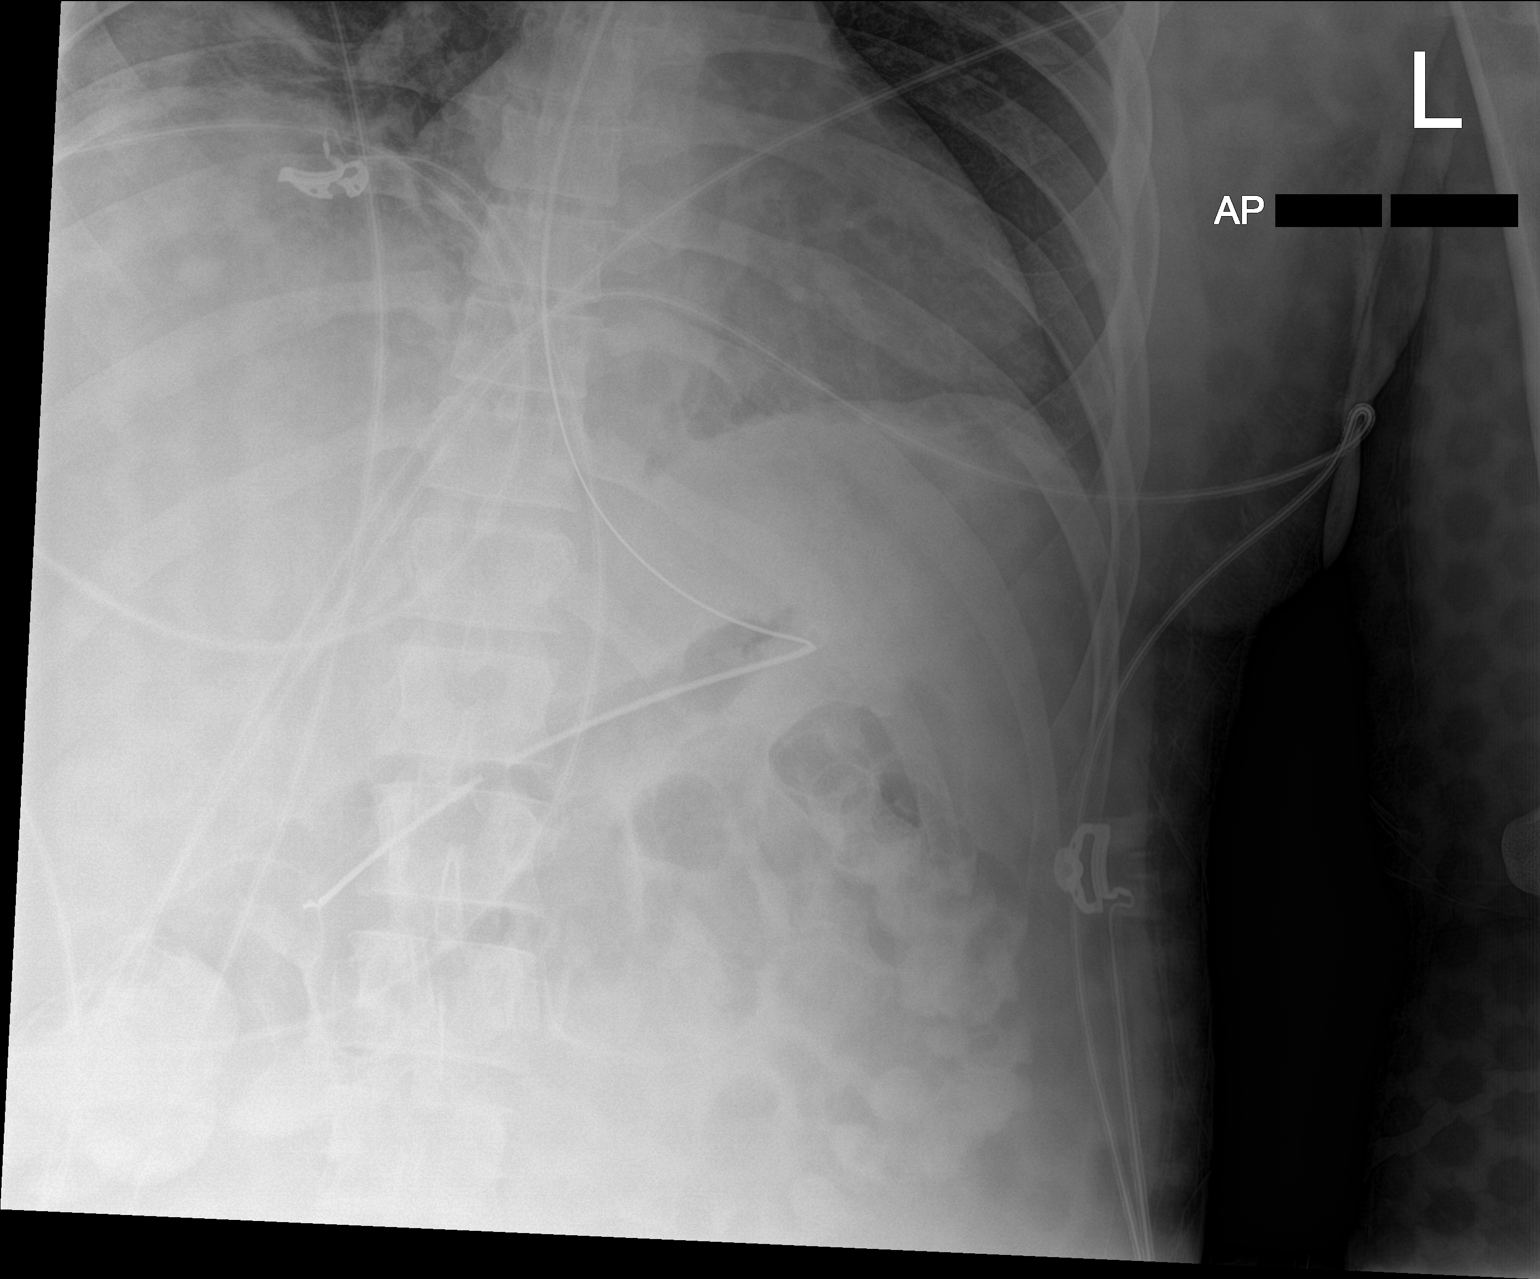

[1 of 1 positions shown; findings below may reference images not displayed]

FINDINGS: NG tube is in place within the stomach.
IMPRESSION: NG tube tip in the stomach.

## 2020-01-30 IMAGING — DX DG CHEST 1V PORT
1 series · 1 of 1 positions shown · non-contrast
Comparison: [DATE]

CLINICAL DATA: Rib fractures

EXAM:
PORTABLE CHEST 1 VIEW

[chest ap]
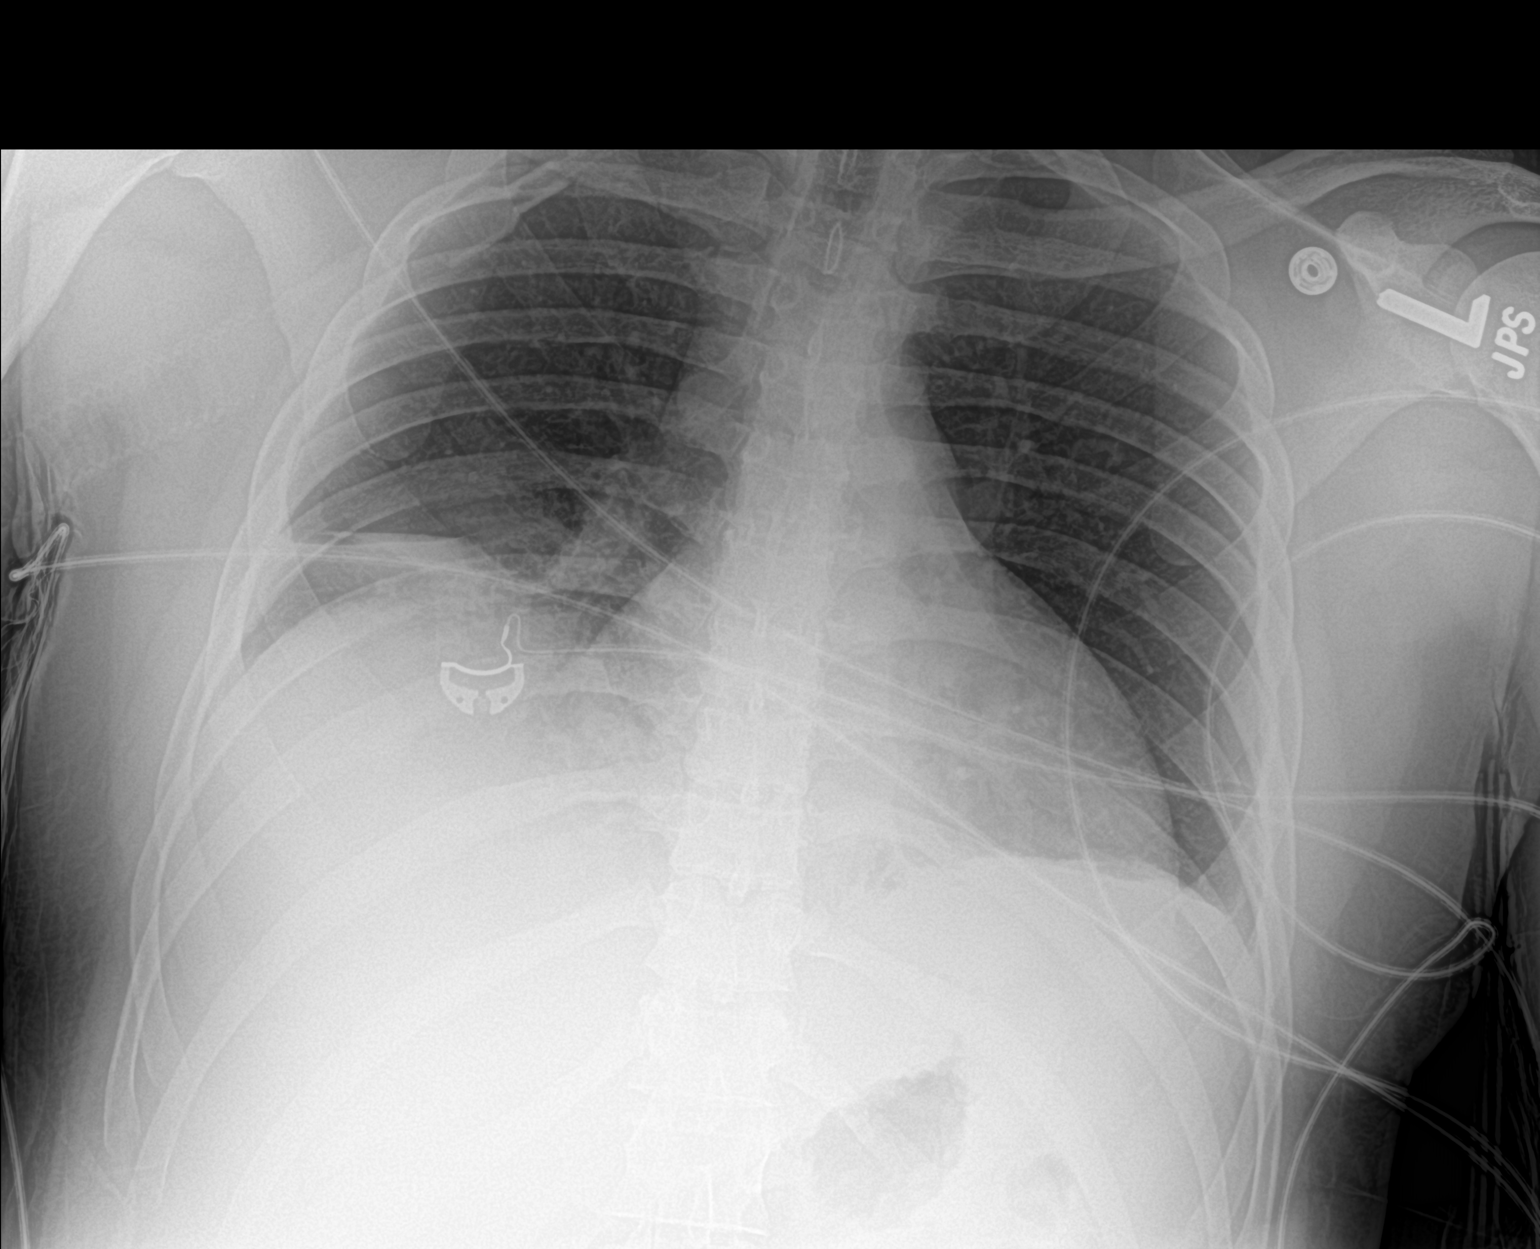

[1 of 1 positions shown; findings below may reference images not displayed]

FINDINGS: Shallow inspiration. Atelectasis in the right lung base with mild
improvement. No pneumothorax. Heart size and pulmonary vascularity
are normal. Mediastinal contours appear intact.
IMPRESSION: Shallow inspiration with improving atelectasis in the right lung
base.

## 2020-01-30 MED ORDER — IOHEXOL 300 MG/ML  SOLN
100.0000 mL | Freq: Once | INTRAMUSCULAR | Status: AC | PRN
  Administered 2020-01-30: 100 mL via INTRAVENOUS

## 2020-01-30 MED ORDER — LORAZEPAM 2 MG/ML IJ SOLN
1.0000 mg | INTRAMUSCULAR | Status: AC | PRN
  Administered 2020-01-31 – 2020-02-01 (×2): 2 mg via INTRAVENOUS
  Administered 2020-02-01: 4 mg via INTRAVENOUS
  Administered 2020-02-01: 2 mg via INTRAVENOUS
  Administered 2020-02-01 – 2020-02-02 (×3): 4 mg via INTRAVENOUS
  Administered 2020-02-02: 2 mg via INTRAVENOUS
  Administered 2020-02-02 (×3): 4 mg via INTRAVENOUS
  Filled 2020-01-30: qty 1
  Filled 2020-01-30 (×2): qty 2
  Filled 2020-01-30: qty 1
  Filled 2020-01-30 (×2): qty 2
  Filled 2020-01-30 (×2): qty 1
  Filled 2020-01-30 (×2): qty 2
  Filled 2020-01-30 (×2): qty 1
  Filled 2020-01-30: qty 2

## 2020-01-30 MED ORDER — LACTATED RINGERS IV BOLUS
1000.0000 mL | Freq: Once | INTRAVENOUS | Status: AC
  Administered 2020-01-30: 1000 mL via INTRAVENOUS

## 2020-01-30 MED ORDER — OXYCODONE HCL 5 MG PO TABS
5.0000 mg | ORAL_TABLET | ORAL | Status: DC | PRN
  Administered 2020-01-30 – 2020-02-08 (×19): 10 mg
  Filled 2020-01-30: qty 2
  Filled 2020-01-30: qty 1
  Filled 2020-01-30 (×9): qty 2
  Filled 2020-01-30: qty 1
  Filled 2020-01-30 (×9): qty 2

## 2020-01-30 MED ORDER — ACETAMINOPHEN 325 MG PO TABS
650.0000 mg | ORAL_TABLET | Freq: Four times a day (QID) | ORAL | Status: DC | PRN
  Administered 2020-01-30 – 2020-02-08 (×21): 650 mg
  Filled 2020-01-30 (×20): qty 2

## 2020-01-30 MED ORDER — VITAMIN D 25 MCG (1000 UNIT) PO TABS
2000.0000 [IU] | ORAL_TABLET | Freq: Two times a day (BID) | ORAL | Status: DC
  Administered 2020-01-30 – 2020-02-08 (×17): 2000 [IU]
  Filled 2020-01-30 (×17): qty 2

## 2020-01-30 MED ORDER — FOLIC ACID 1 MG PO TABS
1.0000 mg | ORAL_TABLET | Freq: Every day | ORAL | Status: DC
  Administered 2020-01-30 – 2020-02-08 (×9): 1 mg
  Filled 2020-01-30 (×9): qty 1

## 2020-01-30 MED ORDER — IOHEXOL 9 MG/ML PO SOLN
500.0000 mL | ORAL | Status: AC
  Administered 2020-01-30 (×2): 500 mL via ORAL

## 2020-01-30 MED ORDER — LORAZEPAM 1 MG PO TABS: 1.0000 mg | ORAL_TABLET | ORAL | Status: AC | PRN

## 2020-01-30 MED ORDER — DOCUSATE SODIUM 50 MG/5ML PO LIQD
100.0000 mg | Freq: Two times a day (BID) | ORAL | Status: DC
  Administered 2020-01-30 – 2020-02-08 (×14): 100 mg
  Filled 2020-01-30 (×16): qty 10

## 2020-01-30 MED ORDER — SIMETHICONE 80 MG PO CHEW
80.0000 mg | CHEWABLE_TABLET | Freq: Four times a day (QID) | ORAL | Status: DC | PRN
  Administered 2020-02-06: 80 mg
  Filled 2020-01-30: qty 1

## 2020-01-30 MED ORDER — VITAMIN D (ERGOCALCIFEROL) 1.25 MG (50000 UNIT) PO CAPS
50000.0000 [IU] | ORAL_CAPSULE | ORAL | Status: DC
  Filled 2020-01-30: qty 1

## 2020-01-30 MED ORDER — ADULT MULTIVITAMIN W/MINERALS CH
1.0000 | ORAL_TABLET | Freq: Every day | ORAL | Status: DC
  Administered 2020-01-30 – 2020-02-08 (×9): 1
  Filled 2020-01-30 (×9): qty 1

## 2020-01-30 MED ORDER — THIAMINE HCL 100 MG PO TABS
100.0000 mg | ORAL_TABLET | Freq: Every day | ORAL | Status: DC
  Administered 2020-01-30 – 2020-02-08 (×9): 100 mg
  Filled 2020-01-30 (×9): qty 1
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
## 2020-01-30 NOTE — Plan of Care (Signed)
  Problem: Clinical Measurements: Goal: Cardiovascular complication will be avoided Outcome: Progressing   Problem: Clinical Measurements: Goal: Will remain free from infection Outcome: Not Progressing   Problem: Nutrition: Goal: Adequate nutrition will be maintained Outcome: Not Progressing

## 2020-01-30 NOTE — Progress Notes (Signed)
OT Cancellation Note  Patient Details Name: KAZUTO SEVEY MRN: [id number] DOB: 12-15-1990   Cancelled Treatment:    Reason Eval/Treat Not Completed: Medical issues which prohibited therapy.  Increased temp and currently sedated.  Will reattempt.  Eber Jones., OTR/L Acute Rehabilitation Services Pager [phone] Office [phone]   Jeani Hawking M 01/30/2020, 12:04 PM
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
## 2020-01-30 NOTE — Progress Notes (Signed)
2000- patient's temp was 103.1 F axillary. Trauma MD made aware that tylenol order is q6h and it cannot be given this time. Also, patient's liver enzymes are elevated. New order to place patient on a cooling blanket.  2200- Patient has been on cooling blanket and temp has only gotten worse, tmax 105.1 rectal. Trauma MD made aware, new verbal order to give IV tylenol one time dose.   0300- patient's temp 98.6 rectally.

## 2020-01-30 NOTE — Progress Notes (Signed)
Pt transitioned from [facility] to 10L [place]. Sats are 100%. RN aware.
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
## 2020-01-30 NOTE — Progress Notes (Signed)
PT Cancellation Note  Patient Details Name: Christopher Marks MRN: [id number] DOB: January 24, 1991   Cancelled Treatment:    Reason Eval/Treat Not Completed: Medical issues which prohibited therapy;Patient not medically ready Not appropriate for PT today due to increased temp and currently sedated. Will follow.   Blake Divine A Diamante Truszkowski 01/30/2020, 12:05 PM [place], PT, DPT Acute Rehabilitation Services Pager [phone] Office [phone]

## 2020-01-31 ENCOUNTER — Inpatient Hospital Stay (HOSPITAL_COMMUNITY): Payer: No Typology Code available for payment source

## 2020-01-31 LAB — CBC
HCT: 17.6 % — ABNORMAL LOW (ref 39.0–52.0)
HCT: 26.7 % — ABNORMAL LOW (ref 39.0–52.0)
Hemoglobin: 5.7 g/dL — CL (ref 13.0–17.0)
Hemoglobin: 8.7 g/dL — ABNORMAL LOW (ref 13.0–17.0)
MCH: 29.1 pg (ref 26.0–34.0)
MCH: 28.6 pg (ref 26.0–34.0)
MCHC: 32.4 g/dL (ref 30.0–36.0)
MCHC: 32.6 g/dL (ref 30.0–36.0)
MCV: 89.8 fL (ref 80.0–100.0)
MCV: 87.8 fL (ref 80.0–100.0)
Platelets: 121 10*3/uL — ABNORMAL LOW (ref 150–400)
Platelets: 124 10*3/uL — ABNORMAL LOW (ref 150–400)
RBC: 1.96 MIL/uL — ABNORMAL LOW (ref 4.22–5.81)
RBC: 3.04 MIL/uL — ABNORMAL LOW (ref 4.22–5.81)
RDW: 13.4 % (ref 11.5–15.5)
RDW: 13.4 % (ref 11.5–15.5)
WBC: 18.1 10*3/uL — ABNORMAL HIGH (ref 4.0–10.5)
WBC: 20.4 10*3/uL — ABNORMAL HIGH (ref 4.0–10.5)
nRBC: 8 % — ABNORMAL HIGH (ref 0.0–0.2)
nRBC: 6.4 % — ABNORMAL HIGH (ref 0.0–0.2)

## 2020-01-31 LAB — BASIC METABOLIC PANEL
Anion gap: 8 (ref 5–15)
BUN: 47 mg/dL — ABNORMAL HIGH (ref 6–20)
CO2: 22 mmol/L (ref 22–32)
Calcium: 7.7 mg/dL — ABNORMAL LOW (ref 8.9–10.3)
Chloride: 112 mmol/L — ABNORMAL HIGH (ref 98–111)
Creatinine, Ser: 1.79 mg/dL — ABNORMAL HIGH (ref 0.61–1.24)
GFR, Estimated: 51 mL/min — ABNORMAL LOW (ref >60)
Glucose, Bld: 140 mg/dL — ABNORMAL HIGH (ref 70–99)
Potassium: 3.7 mmol/L (ref 3.5–5.1)
Sodium: 142 mmol/L (ref 135–145)

## 2020-01-31 LAB — PREPARE RBC (CROSSMATCH)

## 2020-01-31 IMAGING — DX DG CHEST 1V PORT
1 series · 1 of 1 positions shown · non-contrast
Comparison: [DATE] chest radiograph and prior.

CLINICAL DATA: Central line placement.

EXAM:
PORTABLE CHEST 1 VIEW

[chest]
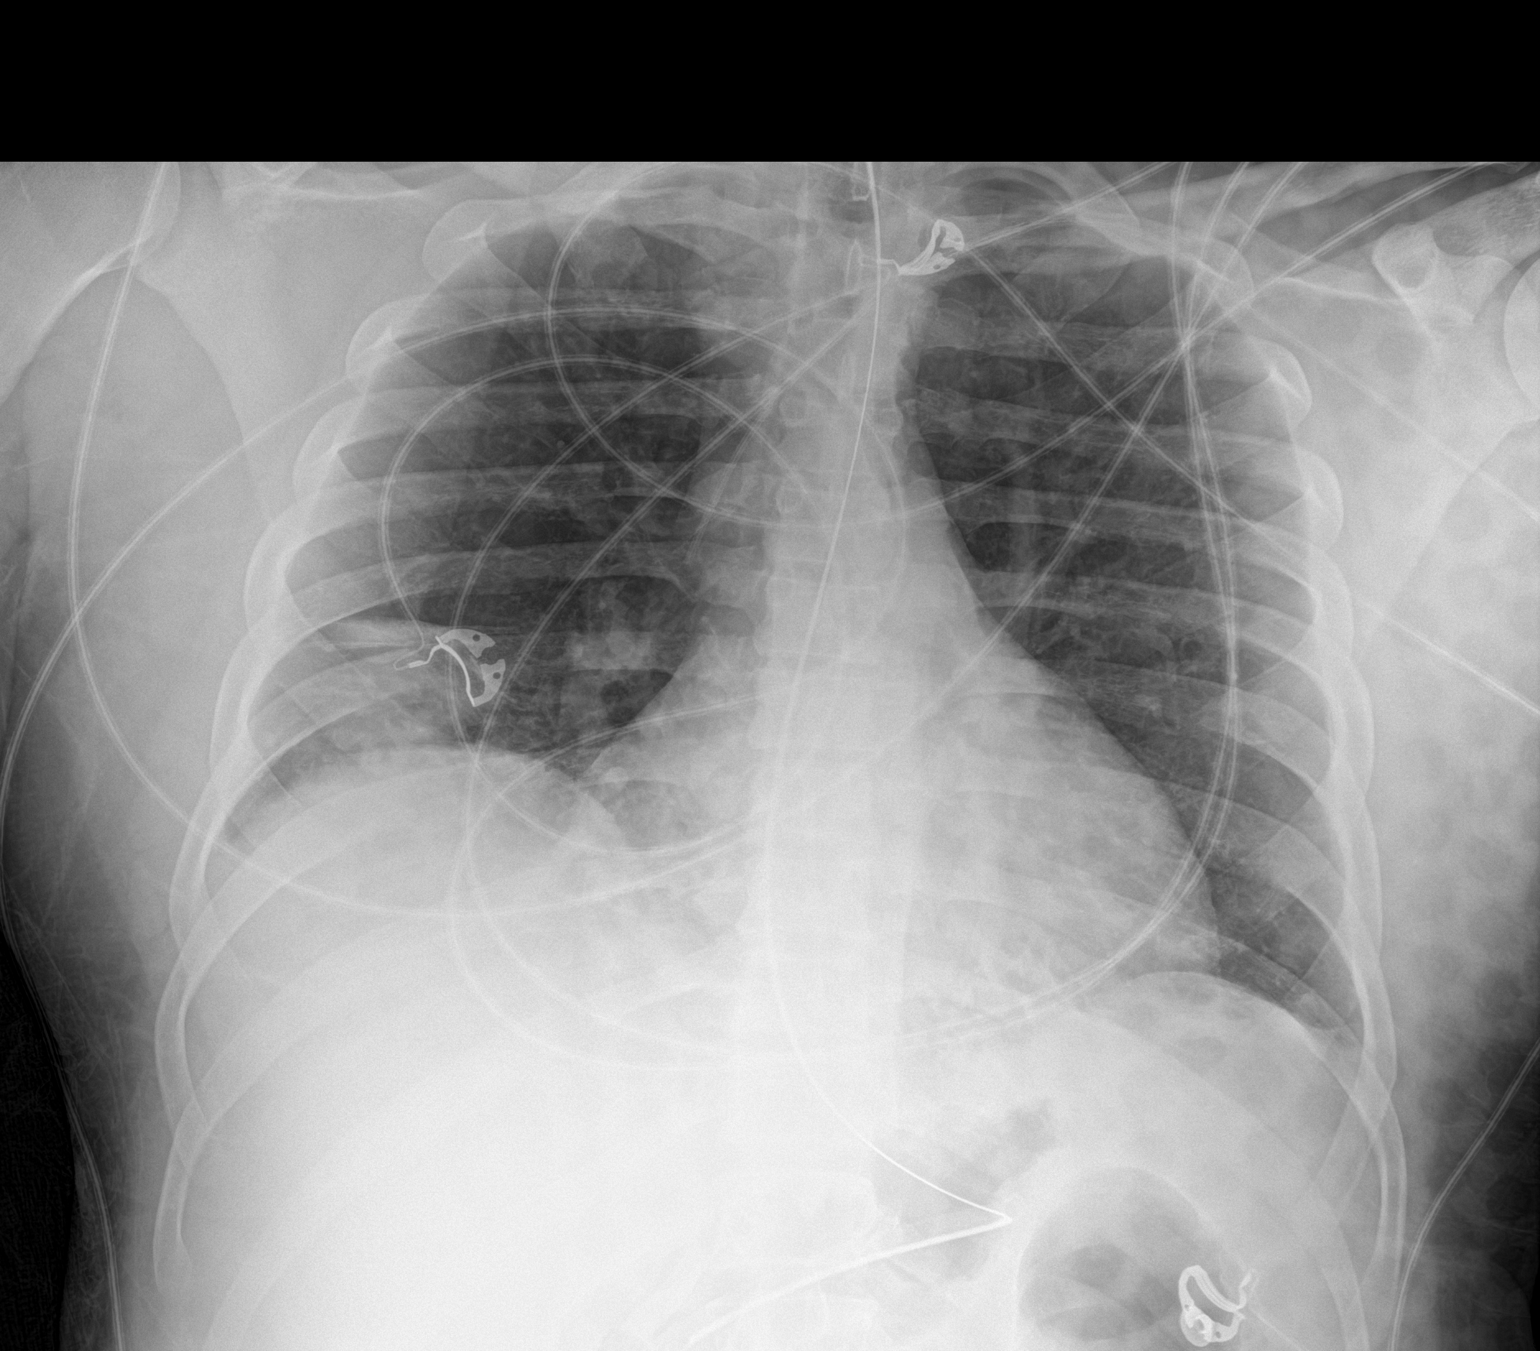

[1 of 1 positions shown; findings below may reference images not displayed]

FINDINGS: Enteric tube courses inferiorly terminating outside field of view.
Left upper extremity PICC tip overlies the distal SVC.

Hypoinflated lungs. Increased conspicuity of right basilar
opacities. No pneumothorax or pleural effusion. Cardiomediastinal
silhouette is unchanged.
IMPRESSION: Left upper extremity PICC tip overlies the distal SVC.

Increased conspicuity of right basilar opacities.
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
## 2020-01-31 MED ORDER — SODIUM CHLORIDE 0.9 % IV SOLN
INTRAVENOUS | Status: DC | PRN
  Administered 2020-01-31 (×2): 250 mL via INTRAVENOUS

## 2020-01-31 MED ORDER — SODIUM CHLORIDE 0.9% IV SOLUTION: Freq: Once | INTRAVENOUS | Status: AC

## 2020-01-31 MED ORDER — ALBUMIN HUMAN 5 % IV SOLN
25.0000 g | Freq: Once | INTRAVENOUS | Status: AC
  Administered 2020-01-31: 25 g via INTRAVENOUS
  Filled 2020-01-31: qty 500

## 2020-01-31 MED ORDER — SODIUM CHLORIDE 0.9% FLUSH
10.0000 mL | Freq: Two times a day (BID) | INTRAVENOUS | Status: DC
  Administered 2020-01-31: 20 mL
  Administered 2020-01-31 – 2020-02-01 (×2): 10 mL
  Administered 2020-02-02: 20 mL
  Administered 2020-02-03 – 2020-02-04 (×3): 10 mL
  Administered 2020-02-04: 20 mL
  Administered 2020-02-05: 10 mL
  Administered 2020-02-05: 30 mL
  Administered 2020-02-06 – 2020-02-15 (×18): 10 mL

## 2020-01-31 MED ORDER — BETHANECHOL CHLORIDE 10 MG PO TABS
25.0000 mg | ORAL_TABLET | Freq: Three times a day (TID) | ORAL | Status: DC
  Administered 2020-01-31 – 2020-02-08 (×23): 25 mg via ORAL
  Filled 2020-01-31 (×23): qty 3

## 2020-01-31 MED ORDER — SODIUM CHLORIDE 0.9% FLUSH: 10.0000 mL | INTRAVENOUS | Status: DC | PRN

## 2020-01-31 MED ORDER — ALTEPLASE 2 MG IJ SOLR
2.0000 mg | Freq: Once | INTRAMUSCULAR | Status: AC
  Administered 2020-01-31: 2 mg
  Filled 2020-01-31: qty 2

## 2020-01-31 NOTE — Progress Notes (Signed)
IP rehab admissions - continuing to follow for potential acute inpatient rehab admission.  Will await medical stability prior to CIR admission.  Call me for questions.  [phone]

## 2020-01-31 NOTE — Progress Notes (Signed)
Peripherally Inserted Central Catheter Placement  The IV Nurse has discussed with the patient and/or persons authorized to consent for the patient, the purpose of this procedure and the potential benefits and risks involved with this procedure.  The benefits include less needle sticks, lab draws from the catheter, and the patient may be discharged home with the catheter. Risks include, but not limited to, infection, bleeding, blood clot (thrombus formation), and puncture of an artery; nerve damage and irregular heartbeat and possibility to perform a PICC exchange if needed/ordered by physician.  Alternatives to this procedure were also discussed.  Bard Power PICC patient education guide, fact sheet on infection prevention and patient information card has been provided to patient /or left at bedside.    PICC Placement Documentation  PICC Triple Lumen 01/31/20 PICC Left Brachial 47 cm 1 cm (Active)  Indication for Insertion or Continuance of Line Prolonged intravenous therapies 01/31/20 0900  Exposed Catheter (cm) 1 cm 01/31/20 0900  Site Assessment Clean;Dry;Intact 01/31/20 0900  Lumen #1 Status Flushed;Blood return noted 01/31/20 0900  Lumen #2 Status Infusing;Blood return noted 01/31/20 0900  Lumen #3 Status Infusing;Blood return noted 01/31/20 0900  Dressing Type Transparent 01/31/20 0900  Dressing Status Clean;Dry;Intact 01/31/20 0900  Antimicrobial disc in place? Yes 01/31/20 0900  Dressing Change Due 02/07/20 01/31/20 0900       Stacie Glaze Horton 01/31/2020, 9:32 AM

## 2020-01-31 NOTE — Progress Notes (Signed)
PT Cancellation Note  Patient Details Name: Christopher Marks MRN: [id number] DOB: December 29, 1990   Cancelled Treatment:    Reason Eval/Treat Not Completed: Medical issues which prohibited therapy. Pt with decline in medical status. Per RN pt with drop in HGB and is now not responding. Suspect pt is bleeding internally somewhere. Acute PT to return as able, as appropriate to progress mobility.  Lewis Shock, PT, DPT Acute Rehabilitation Services Pager #: [phone] Office #: [phone]    [name] 01/31/2020, 9:23 AM
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
## 2020-01-31 NOTE — Progress Notes (Signed)
No blood return from white lumen of PICC after 3 hours dwell time. Unable to flush line despite repositioning with help from bedside RN.

## 2020-01-31 NOTE — Progress Notes (Signed)
No blood return from white lumen of PICC. Alteplase left to dwell. Will reassess.

## 2020-01-31 NOTE — Progress Notes (Signed)
VAST consult received to assess LA TL PICC that was placed this morning. Unit RN reports all three lumens are difficult to flush.  VAST RN assessed PICC including changed caps, and flushing each lumen; good blood return from white and gray lumens which were slightly difficult to flush, red lumen without blood return and difficult to flush. All lines currently infusing without difficulty.  PICC RN Jannette visualized chest x-ray and agreed with MD reading PICC line tip is in appropriate location and no kinks visualized.  Notified pt's RN, Odette Horns of findings.

## 2020-01-31 NOTE — Progress Notes (Signed)
RN and MD spoke with patient biological mother, Kashaun Bebo, who states she is ok with giving patient's grandmother Loma Messing permission to make decisions for patient. Per Ms. Serafina Royals, Ms. Loma Messing raised the patient. RN will continue to monitor.

## 2020-02-01 ENCOUNTER — Inpatient Hospital Stay (HOSPITAL_COMMUNITY): Payer: No Typology Code available for payment source

## 2020-02-01 LAB — COMPREHENSIVE METABOLIC PANEL
ALT: 897 U/L — ABNORMAL HIGH (ref 0–44)
AST: 439 U/L — ABNORMAL HIGH (ref 15–41)
Albumin: 2.4 g/dL — ABNORMAL LOW (ref 3.5–5.0)
Alkaline Phosphatase: 88 U/L (ref 38–126)
Anion gap: 8 (ref 5–15)
BUN: 34 mg/dL — ABNORMAL HIGH (ref 6–20)
CO2: 23 mmol/L (ref 22–32)
Calcium: 7.7 mg/dL — ABNORMAL LOW (ref 8.9–10.3)
Chloride: 115 mmol/L — ABNORMAL HIGH (ref 98–111)
Creatinine, Ser: 1.35 mg/dL — ABNORMAL HIGH (ref 0.61–1.24)
GFR, Estimated: >60 mL/min (ref >60)
Glucose, Bld: 104 mg/dL — ABNORMAL HIGH (ref 70–99)
Potassium: 3.6 mmol/L (ref 3.5–5.1)
Sodium: 146 mmol/L — ABNORMAL HIGH (ref 135–145)
Total Bilirubin: 8 mg/dL — ABNORMAL HIGH (ref 0.3–1.2)
Total Protein: 5.7 g/dL — ABNORMAL LOW (ref 6.5–8.1)

## 2020-02-01 LAB — PHOSPHORUS: Phosphorus: 2.3 mg/dL — ABNORMAL LOW (ref 2.5–4.6)

## 2020-02-01 LAB — BPAM RBC
Blood Product Expiration Date: 202111202359
Blood Product Expiration Date: 202111212359
ISSUE DATE / TIME: 202110191109
ISSUE DATE / TIME: 202110191322
Unit Type and Rh: 5100
Unit Type and Rh: 5100

## 2020-02-01 LAB — TYPE AND SCREEN
ABO/RH(D): O POS
Antibody Screen: NEGATIVE
Unit division: 0
Unit division: 0

## 2020-02-01 LAB — CBC
HCT: 26.9 % — ABNORMAL LOW (ref 39.0–52.0)
Hemoglobin: 8.9 g/dL — ABNORMAL LOW (ref 13.0–17.0)
MCH: 29.6 pg (ref 26.0–34.0)
MCHC: 33.1 g/dL (ref 30.0–36.0)
MCV: 89.4 fL (ref 80.0–100.0)
Platelets: 126 10*3/uL — ABNORMAL LOW (ref 150–400)
RBC: 3.01 MIL/uL — ABNORMAL LOW (ref 4.22–5.81)
RDW: 13.8 % (ref 11.5–15.5)
WBC: 19.6 10*3/uL — ABNORMAL HIGH (ref 4.0–10.5)
nRBC: 5 % — ABNORMAL HIGH (ref 0.0–0.2)

## 2020-02-01 LAB — CULTURE, RESPIRATORY W GRAM STAIN
Culture: NORMAL
Gram Stain: NONE SEEN

## 2020-02-01 LAB — PATHOLOGIST SMEAR REVIEW

## 2020-02-01 LAB — MAGNESIUM: Magnesium: 3.1 mg/dL — ABNORMAL HIGH (ref 1.7–2.4)
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
## 2020-02-01 MED ORDER — ORAL CARE MOUTH RINSE
15.0000 mL | Freq: Two times a day (BID) | OROMUCOSAL | Status: DC
  Administered 2020-02-02 – 2020-02-15 (×28): 15 mL via OROMUCOSAL

## 2020-02-01 MED ORDER — VITAL HIGH PROTEIN PO LIQD: 1000.0000 mL | ORAL | Status: DC

## 2020-02-01 MED ORDER — CHLORHEXIDINE GLUCONATE 0.12 % MT SOLN
15.0000 mL | Freq: Two times a day (BID) | OROMUCOSAL | Status: DC
  Administered 2020-02-01 – 2020-02-15 (×28): 15 mL via OROMUCOSAL
  Filled 2020-02-01 (×25): qty 15

## 2020-02-01 MED ORDER — PROSOURCE TF PO LIQD: 45.0000 mL | Freq: Two times a day (BID) | ORAL | Status: DC

## 2020-02-01 MED ORDER — WHITE PETROLATUM EX OINT
TOPICAL_OINTMENT | CUTANEOUS | Status: AC
  Administered 2020-02-01: 0.2
  Filled 2020-02-01: qty 28.35

## 2020-02-01 MED ORDER — PROSOURCE TF PO LIQD
45.0000 mL | Freq: Three times a day (TID) | ORAL | Status: DC
  Administered 2020-02-01 – 2020-02-06 (×16): 45 mL
  Filled 2020-02-01 (×16): qty 45

## 2020-02-01 MED ORDER — MAGNESIUM CITRATE PO SOLN
0.5000 | Freq: Once | ORAL | Status: AC
  Administered 2020-02-01: 0.5
  Filled 2020-02-01: qty 296

## 2020-02-01 MED ORDER — VITAL 1.5 CAL PO LIQD
1000.0000 mL | ORAL | Status: DC
  Administered 2020-02-01 – 2020-02-06 (×7): 1000 mL
  Filled 2020-02-01 (×5): qty 1000

## 2020-02-01 NOTE — Progress Notes (Signed)
OT Cancellation Note  Patient Details Name: BERLE FITZ MRN: [id number] DOB: March 11, 1991   Cancelled Treatment:    Reason Eval/Treat Not Completed: Patient at procedure or test/ unavailable.  Will reattempt.  Eber Jones., OTR/L Acute Rehabilitation Services Pager [phone] Office [phone]   Jeani Hawking M 02/01/2020, 11:14 AM
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
## 2020-02-01 NOTE — Progress Notes (Signed)
PT Cancellation Note  Patient Details Name: Christopher Marks MRN: [id number] DOB: Mar 14, 1991   Cancelled Treatment:    Reason Eval/Treat Not Completed: Patient at procedure or test/unavailable. Pt off floor at test. PT to return as able to progress mobility as able.  Lewis Shock, PT, DPT Acute Rehabilitation Services Pager #: [phone] Office #: [phone]    [name] 02/01/2020, 1:06 PM
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
## 2020-02-01 NOTE — Procedures (Signed)
Cortrak  Person Inserting Tube:  Marks, Judson Tsan E, RD Tube Type:  Cortrak - 43 inches Tube Location:  Left nare Initial Placement:  Stomach Secured by: Bridle Technique Used to Measure Tube Placement:  Documented cm marking at nare/ corner of mouth Cortrak Secured At:  70 cm    Cortrak Tube Team Note:  Consult received to place a Cortrak feeding tube.   No x-ray is required. RN may begin using tube.   If the tube becomes dislodged please keep the tube and contact the Cortrak team at [email or web address] ([email or web address]) for replacement.  If after hours and replacement cannot be delayed, place a NG tube and confirm placement with an abdominal x-ray.   Christopher Montrose King, MS, RD, LDN Pager number available on Amion 
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
## 2020-02-02 LAB — CBC
HCT: 28.7 % — ABNORMAL LOW (ref 39.0–52.0)
Hemoglobin: 9.5 g/dL — ABNORMAL LOW (ref 13.0–17.0)
MCH: 29.7 pg (ref 26.0–34.0)
MCHC: 33.1 g/dL (ref 30.0–36.0)
MCV: 89.7 fL (ref 80.0–100.0)
Platelets: 165 10*3/uL (ref 150–400)
RBC: 3.2 MIL/uL — ABNORMAL LOW (ref 4.22–5.81)
RDW: 14.3 % (ref 11.5–15.5)
WBC: 22.4 10*3/uL — ABNORMAL HIGH (ref 4.0–10.5)
nRBC: 1.2 % — ABNORMAL HIGH (ref 0.0–0.2)

## 2020-02-02 LAB — COMPREHENSIVE METABOLIC PANEL
ALT: 607 U/L — ABNORMAL HIGH (ref 0–44)
AST: 222 U/L — ABNORMAL HIGH (ref 15–41)
Albumin: 2.2 g/dL — ABNORMAL LOW (ref 3.5–5.0)
Alkaline Phosphatase: 98 U/L (ref 38–126)
Anion gap: 10 (ref 5–15)
BUN: 27 mg/dL — ABNORMAL HIGH (ref 6–20)
CO2: 21 mmol/L — ABNORMAL LOW (ref 22–32)
Calcium: 8 mg/dL — ABNORMAL LOW (ref 8.9–10.3)
Chloride: 114 mmol/L — ABNORMAL HIGH (ref 98–111)
Creatinine, Ser: 1.18 mg/dL (ref 0.61–1.24)
GFR, Estimated: >60 mL/min (ref >60)
Glucose, Bld: 176 mg/dL — ABNORMAL HIGH (ref 70–99)
Potassium: 3.3 mmol/L — ABNORMAL LOW (ref 3.5–5.1)
Sodium: 145 mmol/L (ref 135–145)
Total Bilirubin: 6 mg/dL — ABNORMAL HIGH (ref 0.3–1.2)
Total Protein: 5.6 g/dL — ABNORMAL LOW (ref 6.5–8.1)

## 2020-02-02 LAB — PHOSPHORUS: Phosphorus: 2.4 mg/dL — ABNORMAL LOW (ref 2.5–4.6)

## 2020-02-02 LAB — AMMONIA: Ammonia: 44 umol/L — ABNORMAL HIGH (ref 9–35)

## 2020-02-02 LAB — MAGNESIUM: Magnesium: 3.1 mg/dL — ABNORMAL HIGH (ref 1.7–2.4)

## 2020-02-02 MED ORDER — LORAZEPAM 2 MG/ML IJ SOLN
1.0000 mg | INTRAMUSCULAR | Status: AC | PRN
  Administered 2020-02-02 (×2): 2 mg via INTRAVENOUS
  Administered 2020-02-02 (×2): 4 mg via INTRAVENOUS
  Administered 2020-02-04 – 2020-02-05 (×2): 2 mg via INTRAVENOUS
  Administered 2020-02-05: 1 mg via INTRAVENOUS
  Administered 2020-02-05: 2 mg via INTRAVENOUS
  Filled 2020-02-02 (×2): qty 1
  Filled 2020-02-02: qty 2
  Filled 2020-02-02 (×3): qty 1
  Filled 2020-02-02: qty 2
  Filled 2020-02-02: qty 1

## 2020-02-02 MED ORDER — LACTULOSE 10 GM/15ML PO SOLN
20.0000 g | Freq: Once | ORAL | Status: AC
  Administered 2020-02-02: 20 g
  Filled 2020-02-02: qty 30

## 2020-02-02 MED ORDER — LORAZEPAM 1 MG PO TABS: 1.0000 mg | ORAL_TABLET | ORAL | Status: AC | PRN
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
## 2020-02-02 NOTE — Progress Notes (Signed)
IP rehab admissions - following at a distance until patient becomes more medically stable and until he can participate with therapies more fully.  Call me for questions.  [phone]
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
## 2020-02-02 NOTE — Progress Notes (Addendum)
Pt is hypertensive in 200s, tachycardic in 130s. Dr. Freida Busman paged, she believes it is withdrawals. Ativan given and precedex restarted. Dr. Freida Busman plans to come see patient. RN will continue to monitor.
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
## 2020-02-03 DIAGNOSIS — L8990 Pressure ulcer of unspecified site, unspecified stage: Secondary | ICD-10-CM | POA: Insufficient documentation

## 2020-02-03 LAB — GLUCOSE, CAPILLARY
Glucose-Capillary: 86 mg/dL (ref 70–99)
Glucose-Capillary: 115 mg/dL — ABNORMAL HIGH (ref 70–99)
Glucose-Capillary: 116 mg/dL — ABNORMAL HIGH (ref 70–99)

## 2020-02-03 LAB — COMPREHENSIVE METABOLIC PANEL
ALT: 449 U/L — ABNORMAL HIGH (ref 0–44)
AST: 138 U/L — ABNORMAL HIGH (ref 15–41)
Albumin: 2.1 g/dL — ABNORMAL LOW (ref 3.5–5.0)
Alkaline Phosphatase: 99 U/L (ref 38–126)
Anion gap: 11 (ref 5–15)
BUN: 29 mg/dL — ABNORMAL HIGH (ref 6–20)
CO2: 22 mmol/L (ref 22–32)
Calcium: 8.4 mg/dL — ABNORMAL LOW (ref 8.9–10.3)
Chloride: 115 mmol/L — ABNORMAL HIGH (ref 98–111)
Creatinine, Ser: 1.19 mg/dL (ref 0.61–1.24)
GFR, Estimated: >60 mL/min (ref >60)
Glucose, Bld: 121 mg/dL — ABNORMAL HIGH (ref 70–99)
Potassium: 3.5 mmol/L (ref 3.5–5.1)
Sodium: 148 mmol/L — ABNORMAL HIGH (ref 135–145)
Total Bilirubin: 5.2 mg/dL — ABNORMAL HIGH (ref 0.3–1.2)
Total Protein: 5.7 g/dL — ABNORMAL LOW (ref 6.5–8.1)

## 2020-02-03 LAB — CBC
HCT: 26.9 % — ABNORMAL LOW (ref 39.0–52.0)
Hemoglobin: 8.6 g/dL — ABNORMAL LOW (ref 13.0–17.0)
MCH: 29.3 pg (ref 26.0–34.0)
MCHC: 32 g/dL (ref 30.0–36.0)
MCV: 91.5 fL (ref 80.0–100.0)
Platelets: 224 10*3/uL (ref 150–400)
RBC: 2.94 MIL/uL — ABNORMAL LOW (ref 4.22–5.81)
RDW: 14.7 % (ref 11.5–15.5)
WBC: 22 10*3/uL — ABNORMAL HIGH (ref 4.0–10.5)
nRBC: 0.5 % — ABNORMAL HIGH (ref 0.0–0.2)

## 2020-02-03 LAB — PHOSPHORUS: Phosphorus: 2.8 mg/dL (ref 2.5–4.6)

## 2020-02-03 LAB — CULTURE, BLOOD (ROUTINE X 2)
Culture: NO GROWTH
Culture: NO GROWTH
Special Requests: ADEQUATE
Special Requests: ADEQUATE

## 2020-02-03 LAB — AMMONIA: Ammonia: 51 umol/L — ABNORMAL HIGH (ref 9–35)

## 2020-02-03 LAB — MAGNESIUM: Magnesium: 3.1 mg/dL — ABNORMAL HIGH (ref 1.7–2.4)

## 2020-02-03 MED ORDER — FREE WATER
200.0000 mL | Freq: Three times a day (TID) | Status: DC
  Administered 2020-02-03 – 2020-02-04 (×3): 200 mL

## 2020-02-03 MED ORDER — METOPROLOL TARTRATE 25 MG/10 ML ORAL SUSPENSION
25.0000 mg | Freq: Two times a day (BID) | ORAL | Status: DC
  Administered 2020-02-03 – 2020-02-06 (×7): 25 mg
  Filled 2020-02-03 (×7): qty 10

## 2020-02-03 MED ORDER — LACTULOSE 10 GM/15ML PO SOLN
20.0000 g | Freq: Every day | ORAL | Status: DC
  Administered 2020-02-03 – 2020-02-08 (×6): 20 g
  Filled 2020-02-03 (×6): qty 30

## 2020-02-03 MED ORDER — HYDRALAZINE HCL 20 MG/ML IJ SOLN
10.0000 mg | Freq: Four times a day (QID) | INTRAMUSCULAR | Status: DC | PRN
  Administered 2020-02-04 – 2020-02-07 (×6): 10 mg via INTRAVENOUS
  Filled 2020-02-03 (×6): qty 1

## 2020-02-03 MED ORDER — METOPROLOL TARTRATE 5 MG/5ML IV SOLN
5.0000 mg | Freq: Four times a day (QID) | INTRAVENOUS | Status: DC | PRN
  Administered 2020-02-03 – 2020-02-07 (×8): 5 mg via INTRAVENOUS
  Filled 2020-02-03 (×8): qty 5
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
## 2020-02-04 ENCOUNTER — Inpatient Hospital Stay (HOSPITAL_COMMUNITY): Payer: No Typology Code available for payment source

## 2020-02-04 LAB — BASIC METABOLIC PANEL
Anion gap: 10 (ref 5–15)
BUN: 32 mg/dL — ABNORMAL HIGH (ref 6–20)
CO2: 20 mmol/L — ABNORMAL LOW (ref 22–32)
Calcium: 8.2 mg/dL — ABNORMAL LOW (ref 8.9–10.3)
Chloride: 120 mmol/L — ABNORMAL HIGH (ref 98–111)
Creatinine, Ser: 1.46 mg/dL — ABNORMAL HIGH (ref 0.61–1.24)
GFR, Estimated: >60 mL/min (ref >60)
Glucose, Bld: 120 mg/dL — ABNORMAL HIGH (ref 70–99)
Potassium: 3.3 mmol/L — ABNORMAL LOW (ref 3.5–5.1)
Sodium: 150 mmol/L — ABNORMAL HIGH (ref 135–145)

## 2020-02-04 LAB — GLUCOSE, CAPILLARY
Glucose-Capillary: 128 mg/dL — ABNORMAL HIGH (ref 70–99)
Glucose-Capillary: 114 mg/dL — ABNORMAL HIGH (ref 70–99)
Glucose-Capillary: 140 mg/dL — ABNORMAL HIGH (ref 70–99)
Glucose-Capillary: 107 mg/dL — ABNORMAL HIGH (ref 70–99)
Glucose-Capillary: 91 mg/dL (ref 70–99)
Glucose-Capillary: 97 mg/dL (ref 70–99)

## 2020-02-04 LAB — CBC
HCT: 23.2 % — ABNORMAL LOW (ref 39.0–52.0)
Hemoglobin: 7.5 g/dL — ABNORMAL LOW (ref 13.0–17.0)
MCH: 28.7 pg (ref 26.0–34.0)
MCHC: 32.3 g/dL (ref 30.0–36.0)
MCV: 88.9 fL (ref 80.0–100.0)
Platelets: 328 10*3/uL (ref 150–400)
RBC: 2.61 MIL/uL — ABNORMAL LOW (ref 4.22–5.81)
RDW: 14.7 % (ref 11.5–15.5)
WBC: 20.6 10*3/uL — ABNORMAL HIGH (ref 4.0–10.5)
nRBC: 0.4 % — ABNORMAL HIGH (ref 0.0–0.2)

## 2020-02-04 LAB — AMMONIA: Ammonia: 41 umol/L — ABNORMAL HIGH (ref 9–35)

## 2020-02-04 IMAGING — DX DG CHEST 1V PORT
1 series · 1 of 1 positions shown · non-contrast
Comparison: [DATE] and prior radiographs

CLINICAL DATA: Fever

EXAM:
PORTABLE CHEST 1 VIEW

[chest]
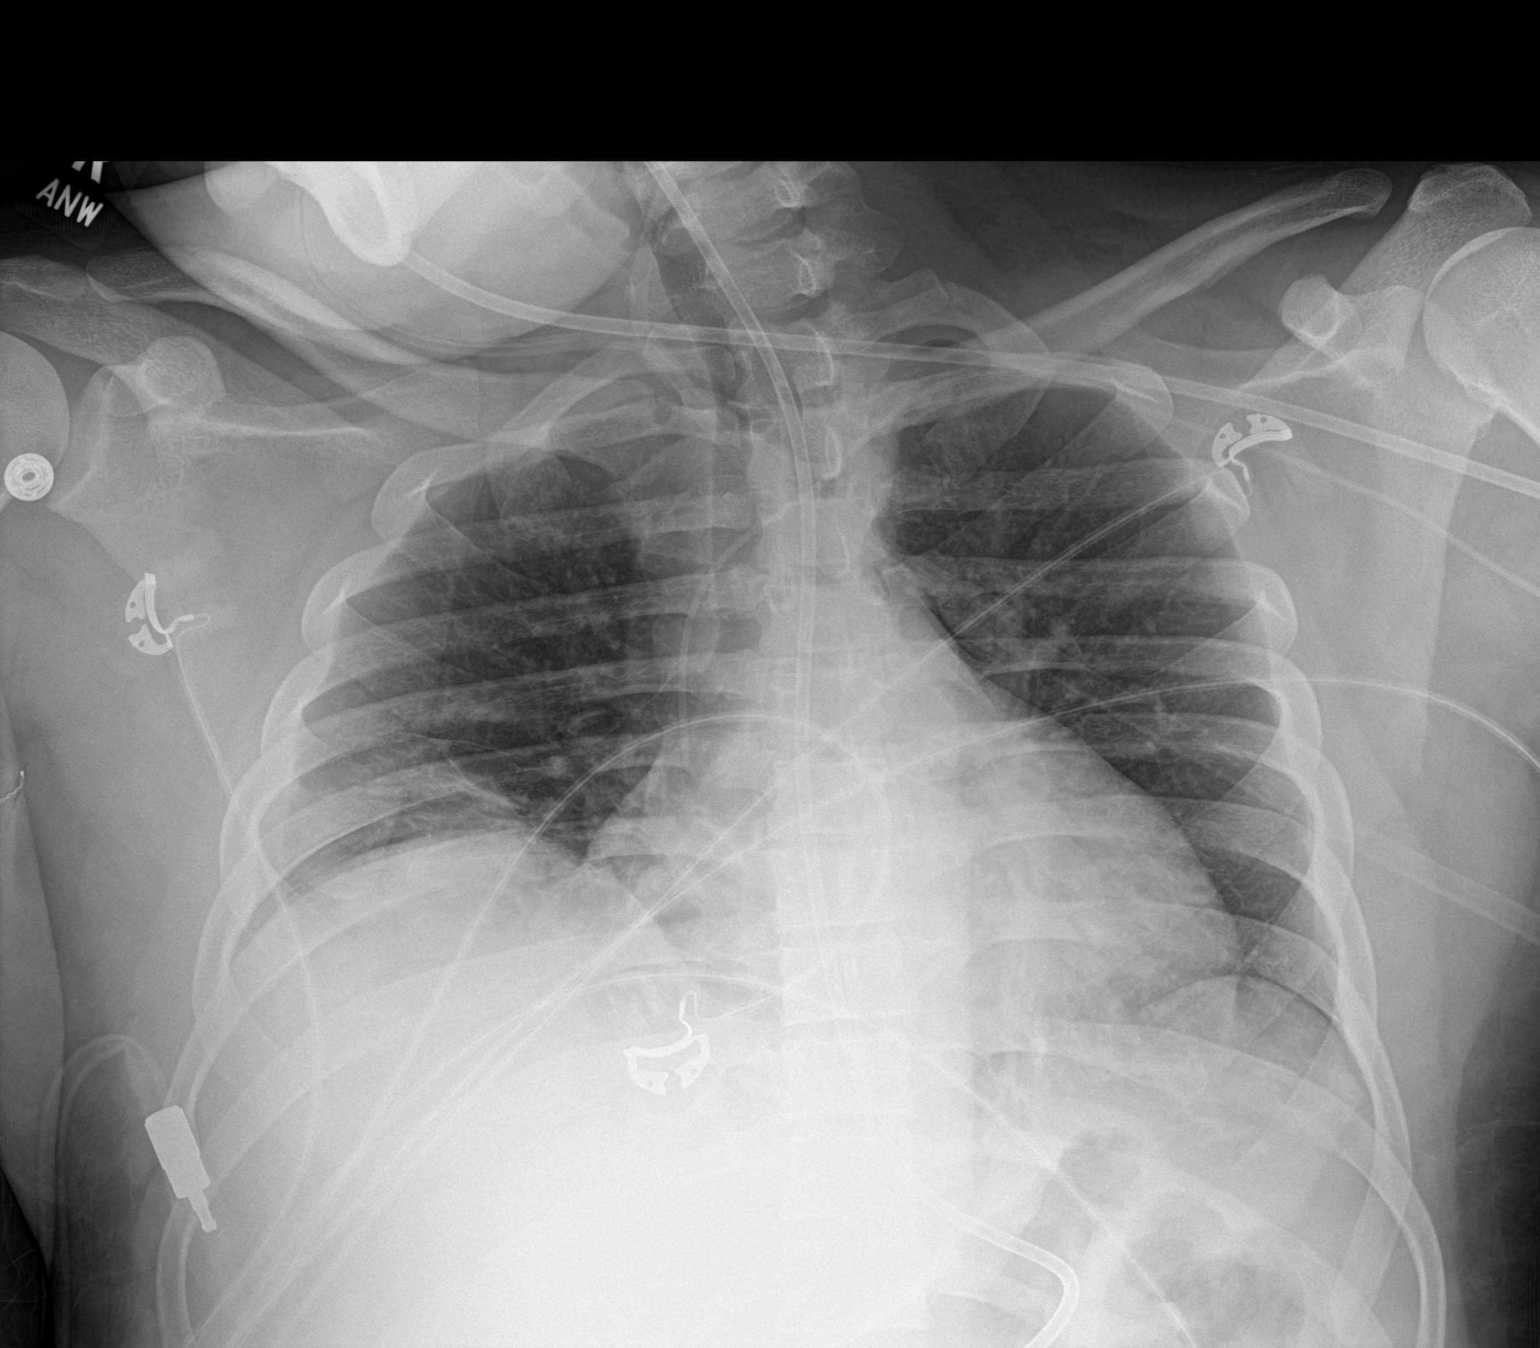

[1 of 1 positions shown; findings below may reference images not displayed]

FINDINGS: This is a low volume study.

Bibasilar opacities are again identified.

A small bore feeding tube and LEFT-sided PICC line are present.

No pleural effusion or pneumothorax noted.

Cardiomediastinal silhouette is unchanged.
IMPRESSION: Unchanged appearance of the chest with bibasilar opacities again
noted and may represent atelectasis and/or airspace disease.

## 2020-02-04 MED ORDER — POTASSIUM CHLORIDE 20 MEQ PO PACK
40.0000 meq | PACK | Freq: Once | ORAL | Status: AC
  Administered 2020-02-04: 40 meq
  Filled 2020-02-04: qty 2

## 2020-02-04 MED ORDER — FREE WATER
200.0000 mL | Freq: Four times a day (QID) | Status: DC
  Administered 2020-02-04 – 2020-02-06 (×10): 200 mL

## 2020-02-04 NOTE — Progress Notes (Signed)
Pharmacy Antibiotic Note  Christopher Marks is a 29 y.o. male admitted on 01/26/2020 with aspiration pna.  Pharmacy has been consulted for Zosyn dosing. Currently on D#7 of Zosyn and patient has persistent fever despite Tylenol.   SCr 1.19 > 1.46, Tm 101.4, WBC down 20.6.  Plan: Continue Zosyn EID 3.375g IV Q8H Monitor clinical progress, renal function F/u de-escalation plan/LOT   Height: 5\' 7"  (170.2 cm) Weight: 97.1 kg (214 lb 1.1 oz) (Bed scale) IBW/kg (Calculated) : 66.1  Temp (24hrs), Avg:101.2 F (38.4 C), Min:100.5 F (38.1 C), Max:101.9 F (38.8 C)  Recent Labs  Lab 01/31/20 0409 01/31/20 0409 01/31/20 1653 02/01/20 0517 02/02/20 0517 02/03/20 0500 02/04/20 0500  WBC 18.1*   < > 20.4* 19.6* 22.4* 22.0* 20.6*  CREATININE 1.79*  --   --  1.35* 1.18 1.19 1.46*   < > = values in this interval not displayed.    Estimated Creatinine Clearance: 84.4 mL/min (A) (by C-G formula based on SCr of 1.46 mg/dL (H)).    No Known Allergies  10/17 zosyn >>  10/17 Resp cx >> negF   10/17 BCx 2>> negF 10/17 UCx >> neg F 10/21 BCx >> NGTD   Annmargaret Decaprio D. 11/21, PharmD, BCPS, BCCCP 02/04/2020, 10:57 AM

## 2020-02-04 NOTE — Progress Notes (Signed)
Inpatient Rehab Admissions Coordinator:  Noted pt participated in PT/OT on 10/22 and demonstrated some improvement.  However, pt remains on Precedex.  Pt also has a fever (101.4).  Will continue to follow pt from a distance.   Wolfgang Phoenix, MS, CCC-SLP Admissions Coordinator [phone]
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
## 2020-02-05 LAB — URINE CULTURE: Culture: NO GROWTH

## 2020-02-05 LAB — GLUCOSE, CAPILLARY
Glucose-Capillary: 111 mg/dL — ABNORMAL HIGH (ref 70–99)
Glucose-Capillary: 111 mg/dL — ABNORMAL HIGH (ref 70–99)
Glucose-Capillary: 120 mg/dL — ABNORMAL HIGH (ref 70–99)
Glucose-Capillary: 120 mg/dL — ABNORMAL HIGH (ref 70–99)
Glucose-Capillary: 144 mg/dL — ABNORMAL HIGH (ref 70–99)
Glucose-Capillary: 89 mg/dL (ref 70–99)

## 2020-02-05 LAB — CBC
HCT: 24.3 % — ABNORMAL LOW (ref 39.0–52.0)
Hemoglobin: 7.8 g/dL — ABNORMAL LOW (ref 13.0–17.0)
MCH: 28.4 pg (ref 26.0–34.0)
MCHC: 32.1 g/dL (ref 30.0–36.0)
MCV: 88.4 fL (ref 80.0–100.0)
Platelets: 450 10*3/uL — ABNORMAL HIGH (ref 150–400)
RBC: 2.75 MIL/uL — ABNORMAL LOW (ref 4.22–5.81)
RDW: 15 % (ref 11.5–15.5)
WBC: 21 10*3/uL — ABNORMAL HIGH (ref 4.0–10.5)
nRBC: 0.3 % — ABNORMAL HIGH (ref 0.0–0.2)

## 2020-02-05 LAB — BASIC METABOLIC PANEL
Anion gap: 10 (ref 5–15)
BUN: 26 mg/dL — ABNORMAL HIGH (ref 6–20)
CO2: 21 mmol/L — ABNORMAL LOW (ref 22–32)
Calcium: 8.4 mg/dL — ABNORMAL LOW (ref 8.9–10.3)
Chloride: 118 mmol/L — ABNORMAL HIGH (ref 98–111)
Creatinine, Ser: 1.28 mg/dL — ABNORMAL HIGH (ref 0.61–1.24)
GFR, Estimated: >60 mL/min (ref >60)
Glucose, Bld: 136 mg/dL — ABNORMAL HIGH (ref 70–99)
Potassium: 4 mmol/L (ref 3.5–5.1)
Sodium: 149 mmol/L — ABNORMAL HIGH (ref 135–145)

## 2020-02-05 LAB — AMMONIA: Ammonia: 46 umol/L — ABNORMAL HIGH (ref 9–35)

## 2020-02-05 MED ORDER — VANCOMYCIN HCL IN DEXTROSE 1-5 GM/200ML-% IV SOLN
1000.0000 mg | Freq: Three times a day (TID) | INTRAVENOUS | Status: DC
  Administered 2020-02-05 – 2020-02-06 (×4): 1000 mg via INTRAVENOUS
  Filled 2020-02-05 (×4): qty 200
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
## 2020-02-05 NOTE — Plan of Care (Signed)
  Problem: Nutrition: Goal: Adequate nutrition will be maintained Outcome: Progressing   

## 2020-02-05 NOTE — Progress Notes (Signed)
Pharmacy Antibiotic Note  Christopher Marks is a 29 y.o. male admitted on 01/26/2020 with aspiration pna and has been on Zosyn for 8 days. Patient has persistent fever despite Tylenol.  Pharmacy consulted to add vancomycin while awaiting cultures.  AKI improving - SCr down to 1.28, CrCL 96 ml/min, Tm 102.3, WBC up 21.  Plan: Vanc 1gm IV Q8H for trough 15-20 mcg/mL, no load with AKI Zosyn EID 3.375gm IV Q8H Monitor renal fxn, clinical progress, vanc trough as indicated CMP in AM with Tylenol use  Height: 5\' 7"  (170.2 cm) Weight: 97.1 kg (214 lb 1.1 oz) (Bed scale) IBW/kg (Calculated) : 66.1  Temp (24hrs), Avg:100.7 F (38.2 C), Min:99.2 F (37.3 C), Max:102.3 F (39.1 C)  Recent Labs  Lab 02/01/20 0517 02/02/20 0517 02/03/20 0500 02/04/20 0500 02/05/20 0530  WBC 19.6* 22.4* 22.0* 20.6* 21.0*  CREATININE 1.35* 1.18 1.19 1.46* 1.28*    Estimated Creatinine Clearance: 96.3 mL/min (A) (by C-G formula based on SCr of 1.28 mg/dL (H)).    No Known Allergies  Zosyn 10/17 >> Vanc 10/24 >>   10/14 MRSA PCR - negative 10/17 Resp cx >> negF   10/17 BCx 2>> negF 10/17 UCx >> neg F 10/21 BCx >> NGTD  10/23 UCx -  10/24 TA -   Clifton Kovacic D. 11/24, PharmD, BCPS, BCCCP 02/05/2020, 11:41 AM

## 2020-02-06 ENCOUNTER — Inpatient Hospital Stay (HOSPITAL_COMMUNITY): Payer: No Typology Code available for payment source

## 2020-02-06 DIAGNOSIS — I639 Cerebral infarction, unspecified: Secondary | ICD-10-CM

## 2020-02-06 DIAGNOSIS — M7989 Other specified soft tissue disorders: Secondary | ICD-10-CM

## 2020-02-06 DIAGNOSIS — I6389 Other cerebral infarction: Secondary | ICD-10-CM

## 2020-02-06 LAB — COMPREHENSIVE METABOLIC PANEL
ALT: 211 U/L — ABNORMAL HIGH (ref 0–44)
AST: 98 U/L — ABNORMAL HIGH (ref 15–41)
Albumin: 2.1 g/dL — ABNORMAL LOW (ref 3.5–5.0)
Alkaline Phosphatase: 113 U/L (ref 38–126)
Anion gap: 8 (ref 5–15)
BUN: 26 mg/dL — ABNORMAL HIGH (ref 6–20)
CO2: 20 mmol/L — ABNORMAL LOW (ref 22–32)
Calcium: 8.5 mg/dL — ABNORMAL LOW (ref 8.9–10.3)
Chloride: 117 mmol/L — ABNORMAL HIGH (ref 98–111)
Creatinine, Ser: 1.26 mg/dL — ABNORMAL HIGH (ref 0.61–1.24)
GFR, Estimated: >60 mL/min (ref >60)
Glucose, Bld: 120 mg/dL — ABNORMAL HIGH (ref 70–99)
Potassium: 4.1 mmol/L (ref 3.5–5.1)
Sodium: 145 mmol/L (ref 135–145)
Total Bilirubin: 3 mg/dL — ABNORMAL HIGH (ref 0.3–1.2)
Total Protein: 6.4 g/dL — ABNORMAL LOW (ref 6.5–8.1)

## 2020-02-06 LAB — HEPARIN LEVEL (UNFRACTIONATED): Heparin Unfractionated: <0.1 [IU]/mL — ABNORMAL LOW (ref 0.30–0.70)

## 2020-02-06 LAB — LIPID PANEL
Cholesterol: 95 mg/dL (ref 0–200)
HDL: <10 mg/dL — ABNORMAL LOW (ref >40)
Triglycerides: 243 mg/dL — ABNORMAL HIGH (ref <150)
VLDL: 49 mg/dL — ABNORMAL HIGH (ref 0–40)

## 2020-02-06 LAB — CBC
HCT: 23.2 % — ABNORMAL LOW (ref 39.0–52.0)
HCT: 24.3 % — ABNORMAL LOW (ref 39.0–52.0)
Hemoglobin: 7.2 g/dL — ABNORMAL LOW (ref 13.0–17.0)
Hemoglobin: 7.7 g/dL — ABNORMAL LOW (ref 13.0–17.0)
MCH: 27.7 pg (ref 26.0–34.0)
MCH: 27.8 pg (ref 26.0–34.0)
MCHC: 31 g/dL (ref 30.0–36.0)
MCHC: 31.7 g/dL (ref 30.0–36.0)
MCV: 89.2 fL (ref 80.0–100.0)
MCV: 87.7 fL (ref 80.0–100.0)
Platelets: 482 10*3/uL — ABNORMAL HIGH (ref 150–400)
Platelets: 624 10*3/uL — ABNORMAL HIGH (ref 150–400)
RBC: 2.6 MIL/uL — ABNORMAL LOW (ref 4.22–5.81)
RBC: 2.77 MIL/uL — ABNORMAL LOW (ref 4.22–5.81)
RDW: 15.2 % (ref 11.5–15.5)
RDW: 15 % (ref 11.5–15.5)
WBC: 19.7 10*3/uL — ABNORMAL HIGH (ref 4.0–10.5)
WBC: 22.1 10*3/uL — ABNORMAL HIGH (ref 4.0–10.5)
nRBC: 0.1 % (ref 0.0–0.2)
nRBC: 0.1 % (ref 0.0–0.2)

## 2020-02-06 LAB — GLUCOSE, CAPILLARY
Glucose-Capillary: 109 mg/dL — ABNORMAL HIGH (ref 70–99)
Glucose-Capillary: 110 mg/dL — ABNORMAL HIGH (ref 70–99)
Glucose-Capillary: 117 mg/dL — ABNORMAL HIGH (ref 70–99)
Glucose-Capillary: 142 mg/dL — ABNORMAL HIGH (ref 70–99)
Glucose-Capillary: 99 mg/dL (ref 70–99)

## 2020-02-06 LAB — ECHOCARDIOGRAM COMPLETE
Area-P 1/2: 5.42 cm2
S' Lateral: 3.35 cm

## 2020-02-06 LAB — AMMONIA: Ammonia: 15 umol/L (ref 9–35)

## 2020-02-06 IMAGING — CT CT ABD-PELV W/ CM
2 of 5 series · 10 of 36 positions shown, 12 images · IV contrast (Omni 300)
Comparison: CT [DATE]

CLINICAL DATA: New fever, possible abscess

EXAM:
CT CHEST, ABDOMEN, AND PELVIS WITH CONTRAST
TECHNIQUE: Multidetector CT imaging of the chest, abdomen and pelvis was
performed following the standard protocol during bolus
administration of intravenous contrast.
CONTRAST:  100mL OMNIPAQUE IOHEXOL 350 MG/ML SOLN

[Series 7: cap with 5mm st · axial · 0.89mm/px · z∈[-867,-292]mm · 7 of 141 slices shown, 9 images]
[im 13/141  mediastinal]
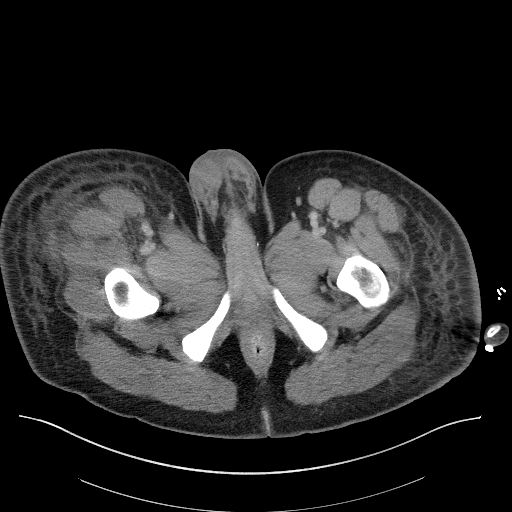
[im 13/141  lung]
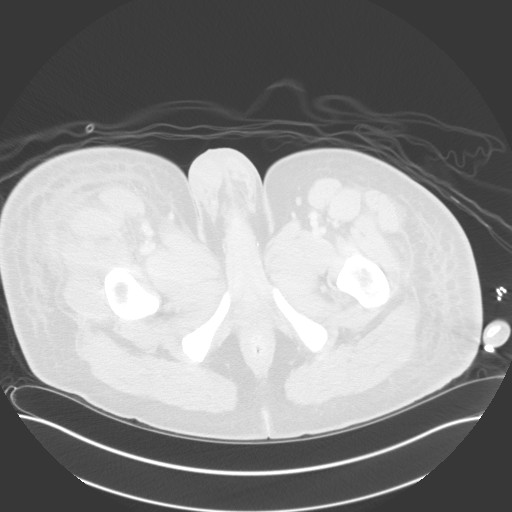
[im 39/141  lung]
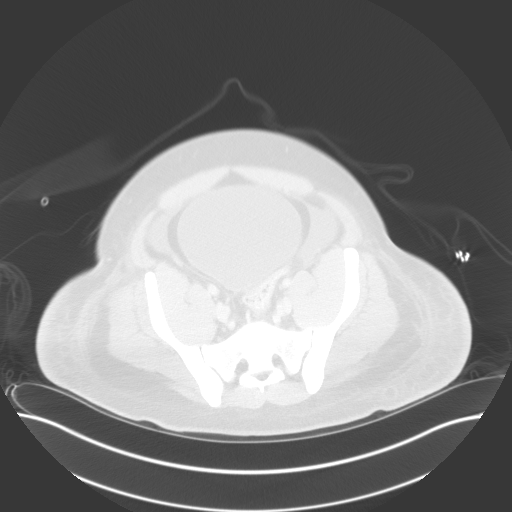
[im 51/141  lung]
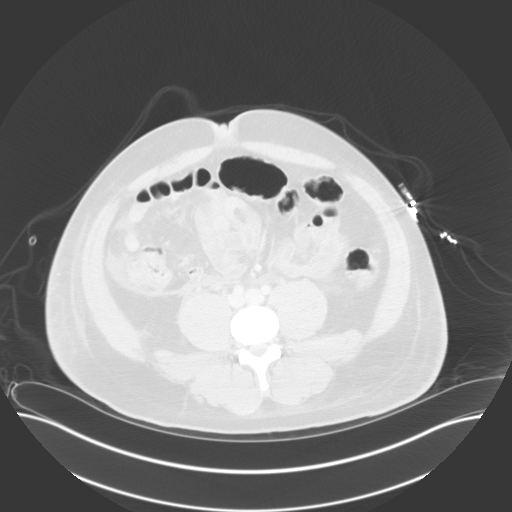
[im 77/141  lung]
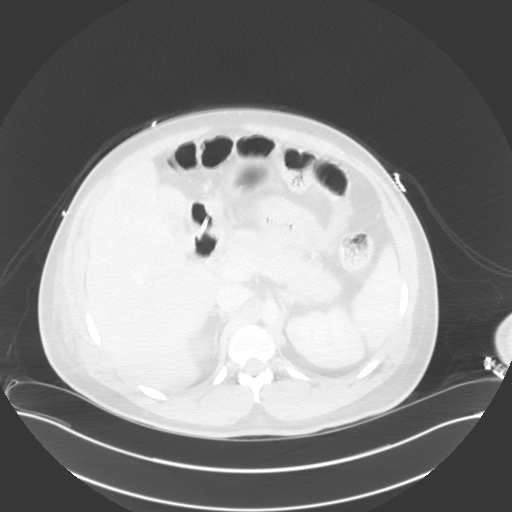
[im 90/141  mediastinal]
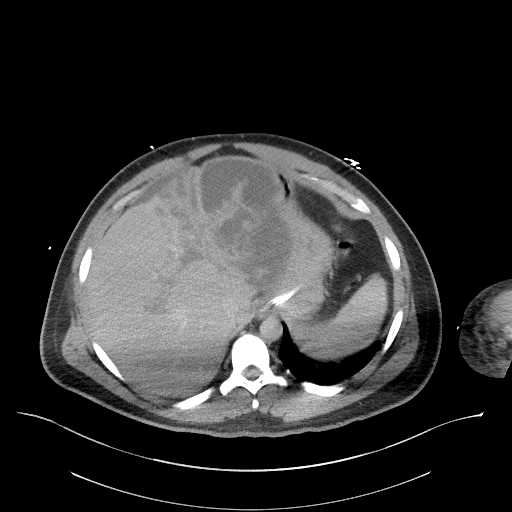
[im 90/141  lung]
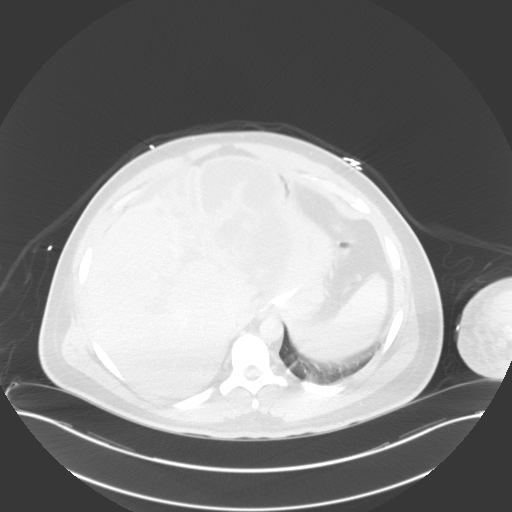
[im 102/141  lung]
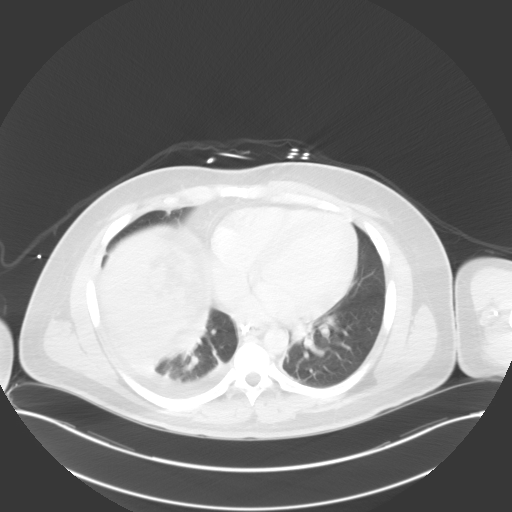
[im 128/141  lung]
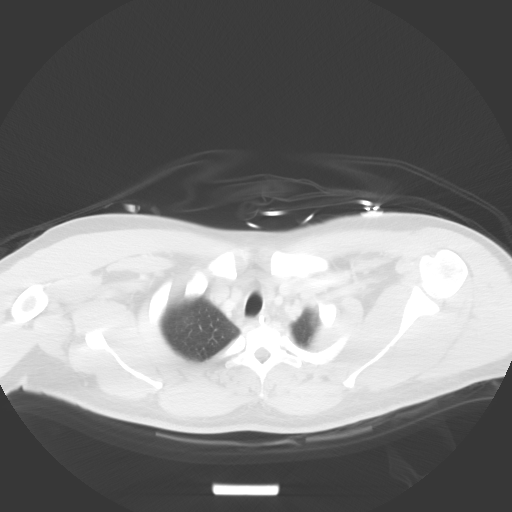

[Series 9: cap with 3mm st cor · coronal · 0.68mm/px · 3 of 151 slices shown]
[im 31/151  lung]
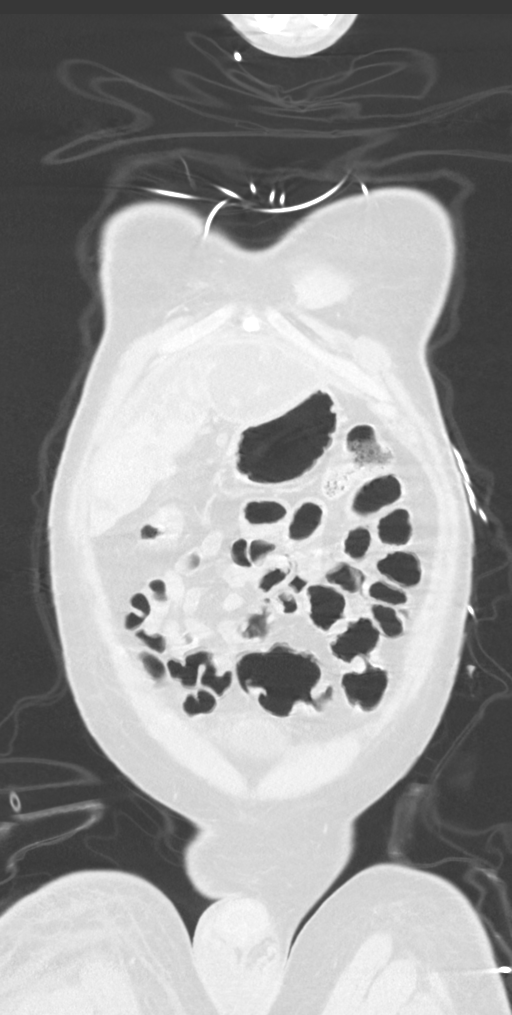
[im 61/151  lung]
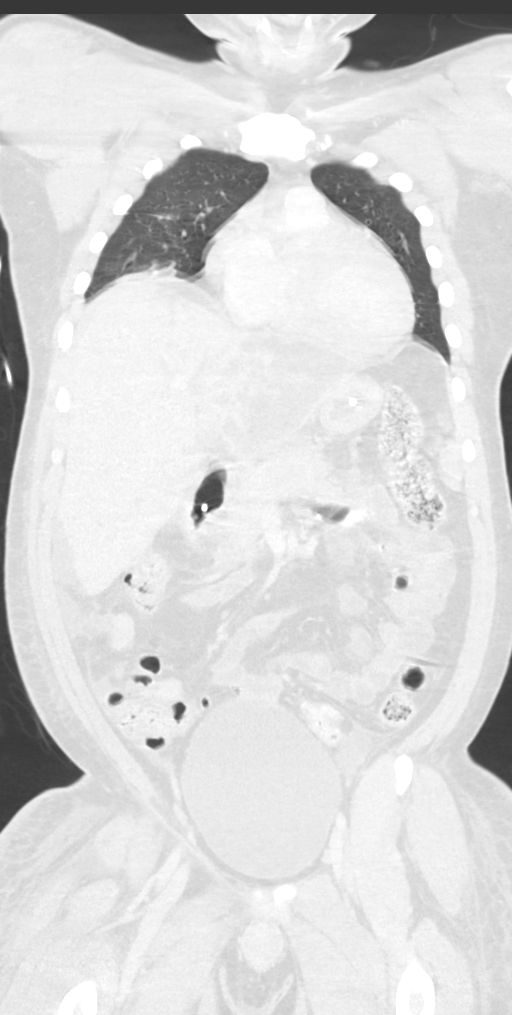
[im 91/151  lung]
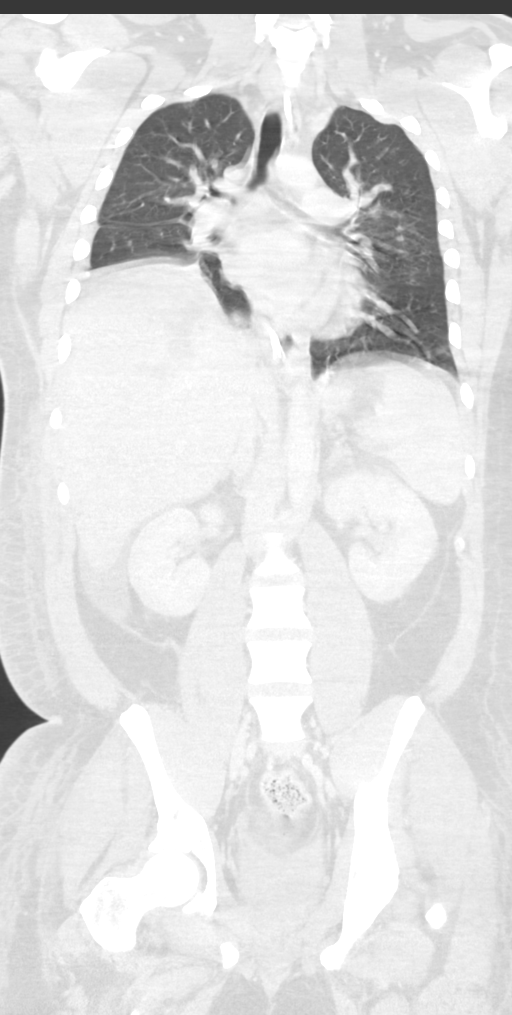

[10 of 36 positions shown; findings below may reference images not displayed]

FINDINGS: CT CHEST FINDINGS

Cardiovascular: Normal heart size. No pericardial effusion. The
aortic root is suboptimally assessed given cardiac pulsation
artifact. The aorta is normal caliber. No gross acute aortic
abnormality. No periaortic stranding or hemorrhage. Normal 3 vessel
branching of the aortic arch. Proximal great vessels are
unremarkable. Central pulmonary artery caliber. Motion artifact and
non tailored technique significantly limit evaluation for pulmonary
artery emboli. Left upper extremity PICC terminates at the superior
cavoatrial junction. Mild soft tissue stranding and skin thickening
near the IV access site along the medial left upper arm.

Mediastinum/Nodes: No mediastinal fluid or gas. Normal thyroid gland
and thoracic inlet. No acute abnormality of the trachea or
esophagus. Transesophageal tube remains in place. No worrisome
mediastinal, hilar or axillary adenopathy.

Lungs/Pleura: Small right pleural effusion. Some adjacent passive
atelectatic changes in subsegmental atelectasis in the right lower
lobe. More consolidative opacity in the right lower lobe, residual
underlying consolidation/aspiration is difficult to exclude. No left
effusion. No pneumothorax.

Musculoskeletal: No acute or worrisome osseous or soft tissue
abnormality of the chest wall. Soft tissue stranding along the
medial left upper arm near the IV access site, as detailed above.
This is increasing from comparison CT.

CT ABDOMEN PELVIS FINDINGS

Hepatobiliary: Corresponding well to regions of hypoenhancement on
the comparison CT from [DATE] is developing necrosis of much of
the left lobe liver. Sparing of the hepatic capsule is noted.
Superinfection/developing posttraumatic abscess cannot be fully
excluded on imaging alone. Additionally, there has been evolution of
laceration predominantly extending through segments 4 and 8 of the
liver anterior lesser extent segment 6 posteriorly.

Gallbladder is nondistended but does contain some intermediate
attenuation material which could reflect biliary sludge or vicarious
extravasation of contrast. Some trace pericholecystic fluid is
nonspecific given adjacent perihepatic fluid as well.

Pancreas: No pancreatic contusive changes or ductal disruption. No
pancreatic ductal dilatation or surrounding inflammatory changes.

Spleen: Region of previously seen splenic laceration is less well
visualized due to motion artifact. No new splenic abnormality.
Spleen is normal size.

Adrenals/Urinary Tract: No adrenal hemorrhage or suspicious adrenal
lesion. Wedge-shaped region of hypoattenuation interpolar to lower
pole left kidney, likely reflecting a developing area of renal
infarct. No other concerning renal lesion. No perinephric stranding
or hemorrhage. No urolithiasis or hydronephrosis. Bladder distension
without evidence of direct bladder injury or other acute bladder
abnormality.

Stomach/Bowel: Transesophageal tube tip terminates in the 1st to 2nd
portion of the duodenum. Side port within the distal gastric body.
Distal thoracic esophagus, stomach and duodenum are otherwise
unremarkable. No small bowel thickening or dilatation. A normal
appendix is visualized. No colonic dilatation or wall thickening.

Vascular/Lymphatic: Apparent devascularization of the left lobe
liver and hepatic last a changes. The clear site of vascular
disruption is not evident. Similarly, no visible vascular
abnormality is seen of the renal arteries or veins within the
limitations of this motion degraded, non tailored exam. No other
acute or suspicious osseous abnormality. No suspicious or enlarged
lymph nodes in the included lymphatic chains.

Reproductive: The prostate and seminal vesicles are unremarkable.

Other: Small to moderate volume low to intermediate attenuation free
fluid layering in the abdomen and pelvis measuring between 15-20
Hounsfield units with a predominance towards the right lobe liver
tip and in the lower pericolic gutters. Diffuse body wall edema
noted as well predominantly along the flanks, right abdominal wall
and bilateral hips.

Musculoskeletal: No acute lumbosacral osseous abnormality. Portions
of the bony pelvis are intact and congruent. Proximal femora are
intact with femoral heads normally located. Asymmetric stranding and
thickening about the musculature of the anterior compartment right
thigh.
IMPRESSION: 1. Imaging quality is significantly degraded by patient motion
artifact.
2. Persistent consolidation in the right lower lobe, could reflect
atelectasis versus residual aspiration and/or consolidation. New
small right pleural effusion as well with some adjacent passive
atelectatic changes.
3. Multi directional laceration with interval evolution extending
through segments 4 and 8 of the liver as well as segment 6
posteriorly. More diffuse hypoattenuation corresponding well to
regions of hypoenhancement throughout the left lobe liver likely
reflect developing hepatic necrosis involving much of the left lobe
liver. Superinfection cannot be fully excluded on imaging alone.
4. Wedge-shaped region of hypoattenuation in the interpolar to lower
pole left kidney, likely reflecting a developing area of renal
infarct.
5. Suboptimal visualization of a previously seen splenic laceration.
6. Small to moderate volume low to intermediate attenuation free
fluid layering in the abdomen and pelvis measuring between 15-20
Hounsfield units with a predominance towards the right lobe liver
tip and in the lower pericolic gutters. Findings are nonspecific but
could reflect a combination of simple fluid and blood products.
7. Nonspecific though increasing stranding and thickening about the
musculature of the anterior compartment right thigh, correlate with
exam findings.
8. Transesophageal tube tip terminates in the 1st to 2nd portion of
the duodenum. Side port within the distal gastric body.
9. Mild soft tissue stranding and skin thickening near the IV access
site along the medial left upper arm. Correlate with visual
inspection.

## 2020-02-06 IMAGING — CT CT HEAD W/O CM
4 series · 16 of 47 positions shown, 18 images · non-contrast
Comparison: Head CT [DATE]

CLINICAL DATA: Mental status change, unknown cause; decreased
movement on right side.

EXAM:
CT HEAD WITHOUT CONTRAST
TECHNIQUE: Contiguous axial images were obtained from the base of the skull
through the vertex without intravenous contrast.

[Series 3: head bone · axial · 0.46mm/px · z∈[-94,-58]mm · 3 of 90 slices shown]
[im 9/90  bone]
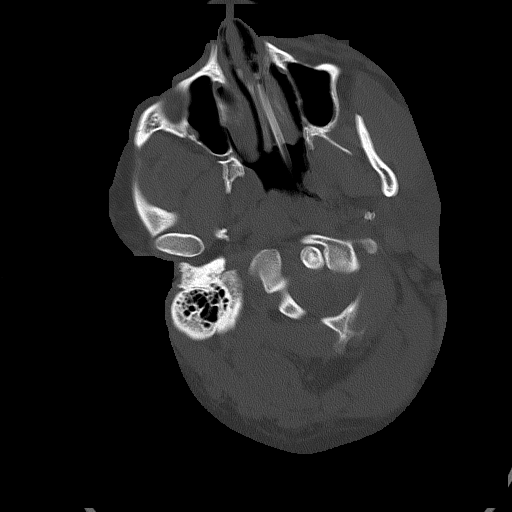
[im 18/90  bone]
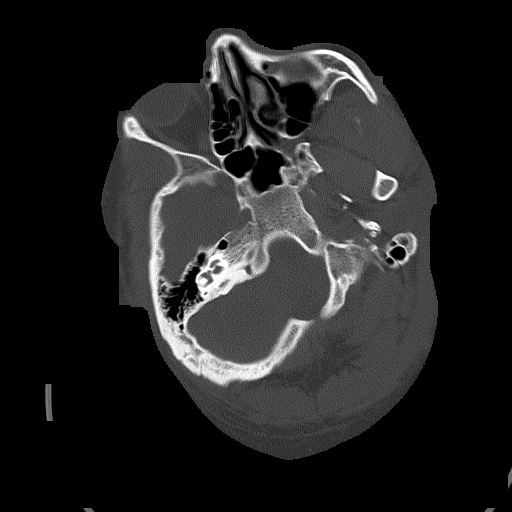
[im 27/90  bone]
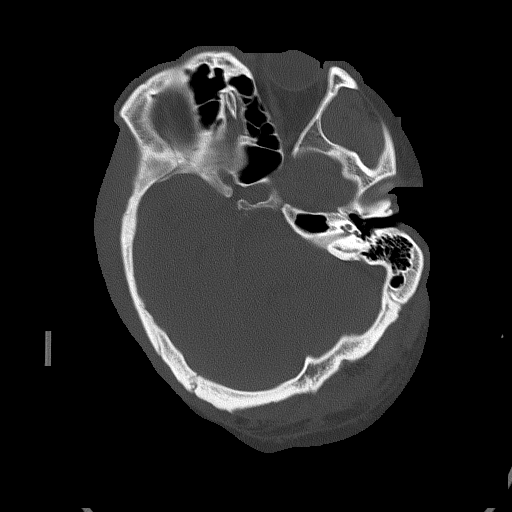

[Series 4: head without · axial · non-contrast · 0.45mm/px · z∈[-90,+40]mm · 7 of 36 slices shown, 9 images]
[im 5/36  brain]
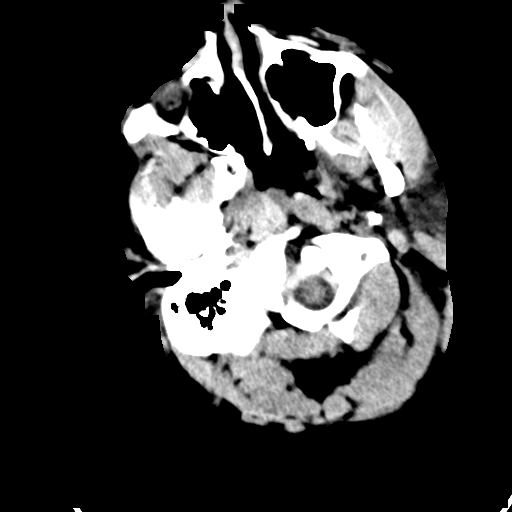
[im 5/36  bone]
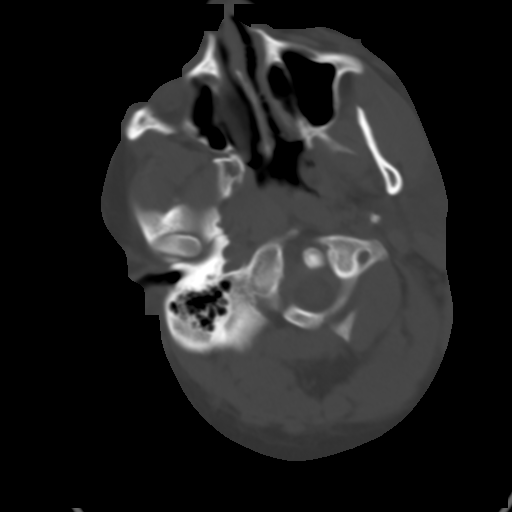
[im 9/36  brain]
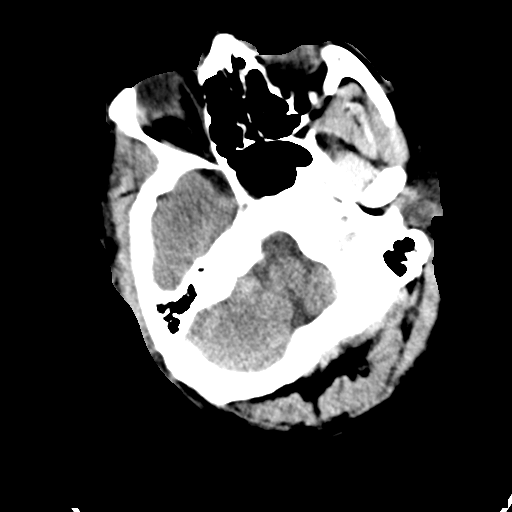
[im 14/36  brain]
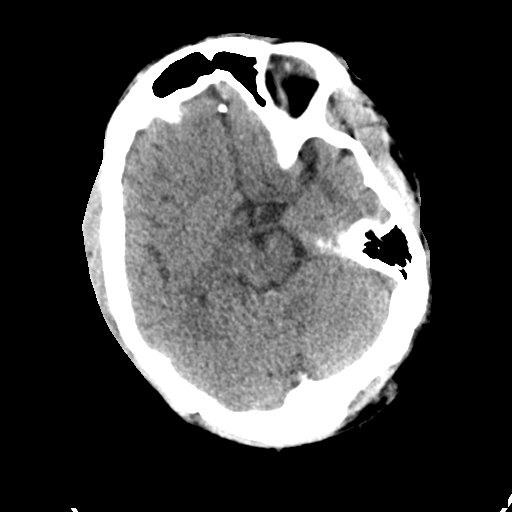
[im 18/36  brain]
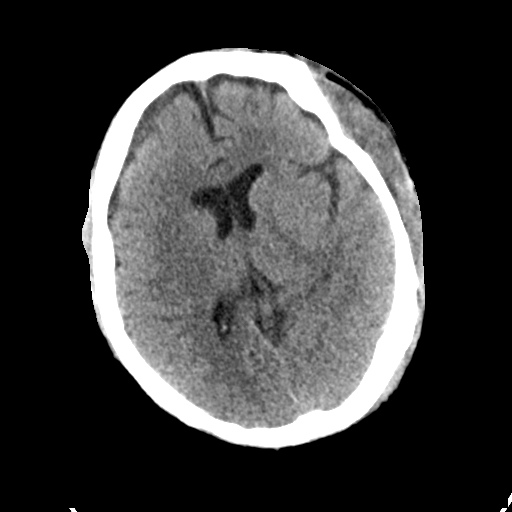
[im 22/36  brain]
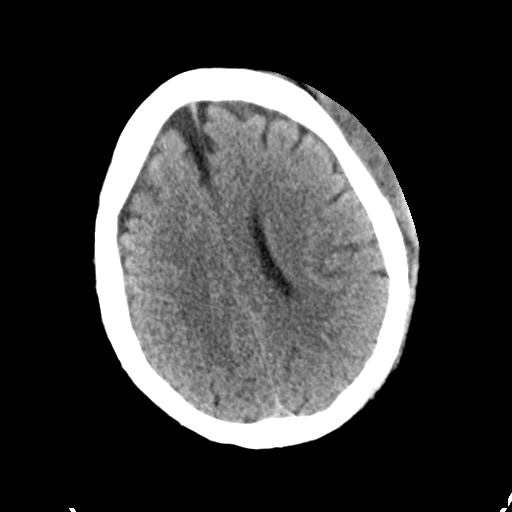
[im 22/36  bone]
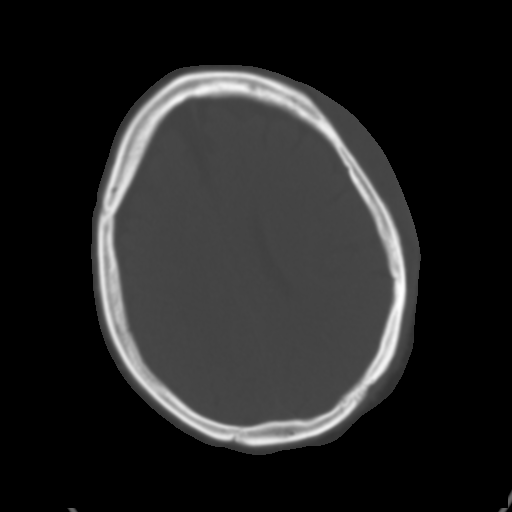
[im 27/36  brain]
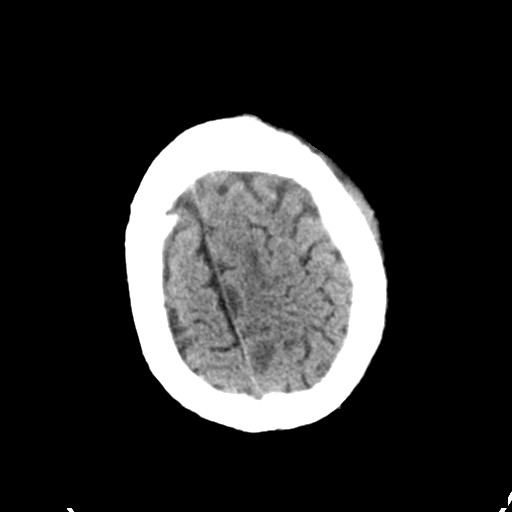
[im 31/36  brain]
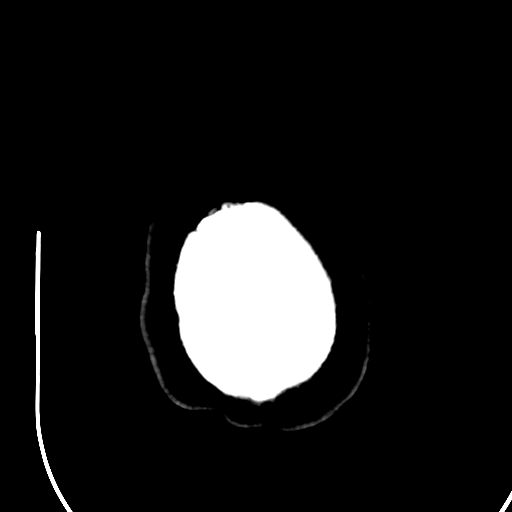

[Series 5: head without cor · coronal · non-contrast · 0.38mm/px · 3 of 81 slices shown]
[im 27/81  brain]
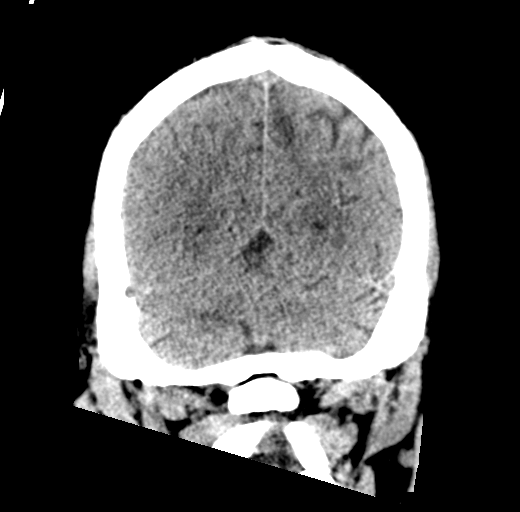
[im 36/81  brain]
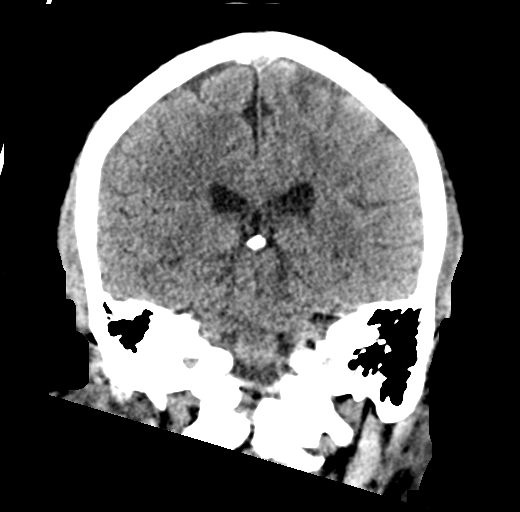
[im 45/81  brain]
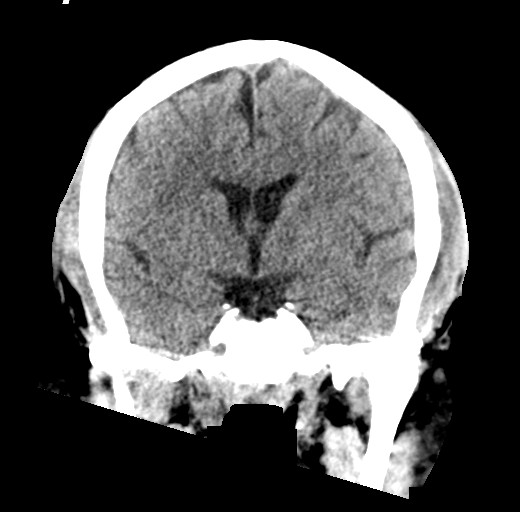

[Series 6: head without sag · sagittal · non-contrast · 0.36mm/px · 3 of 67 slices shown]
[im 31/67  brain]
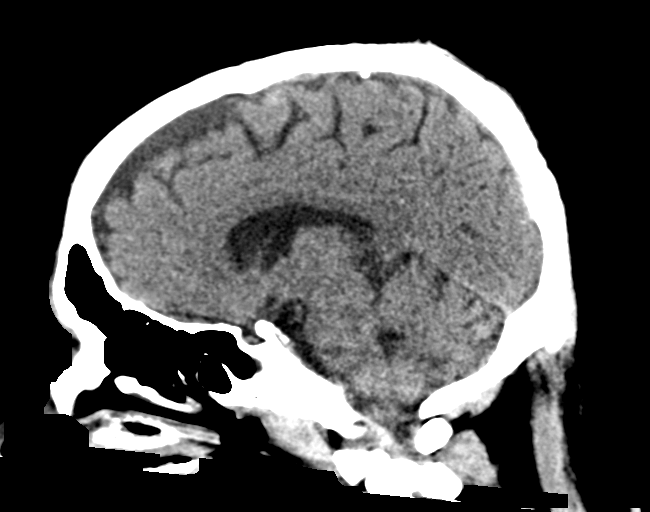
[im 34/67  brain]
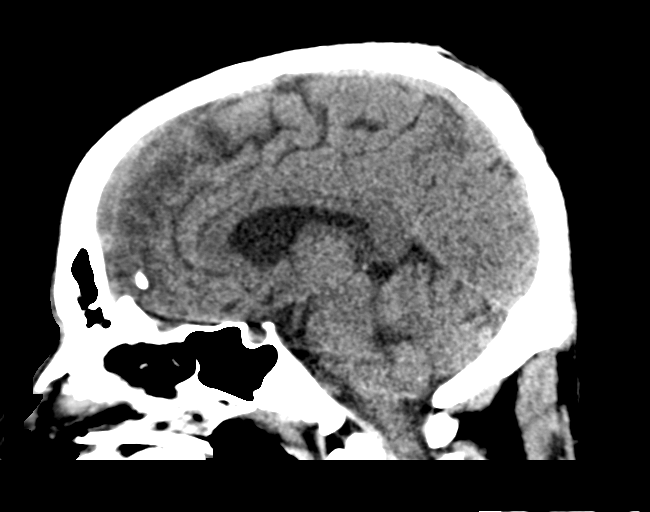
[im 37/67  brain]
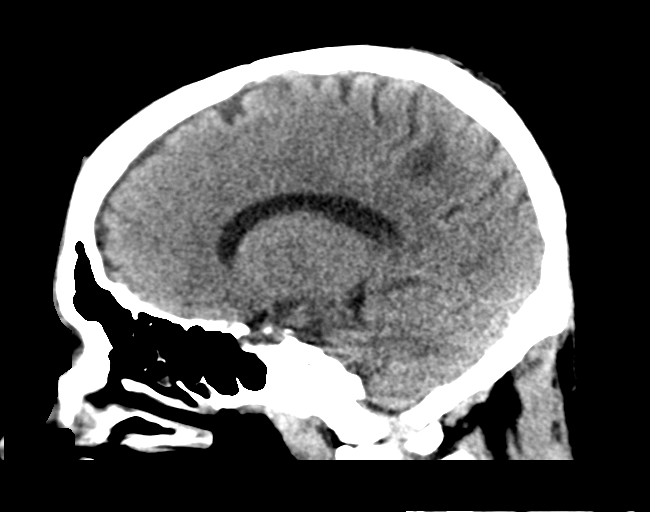

[16 of 47 positions shown; findings below may reference images not displayed]

FINDINGS: Brain:

New from the prior head CT of [DATE], there is patchy
cortical/subcortical hypodensity within the high paramedian left
frontal lobe and within the left parietal lobe. Findings are
consistent with acute/early subacute infarcts.

There is no acute intracranial hemorrhage.

No extra-axial fluid collection.

No evidence of intracranial mass.

No midline shift.

Vascular: No hyperdense vessel.

Skull: Normal. Negative for fracture or focal lesion.

Sinuses/Orbits: Visualized orbits show no acute finding. Small
amount of frothy secretions within the left sphenoid sinus. No
significant mastoid effusion.

Other: Redemonstrated deformities of the bilateral nasal bones. Left
scalp soft tissue swelling/hematoma. Partially visualized support
tubes.

These results will be called to the ordering clinician or
representative by the Radiologist Assistant, and communication
documented in the PACS or [REDACTED].
IMPRESSION: Patchy acute/early subacute cortical and subcortical infarcts within
the left frontal and parietal lobes as described. No evidence of
hemorrhagic conversion. No significant mass effect.

Left scalp soft tissue swelling/hematoma.

Redemonstrated age-indeterminate fracture deformities of the
bilateral nasal bones.

## 2020-02-06 IMAGING — MR MR HEAD W/O CM
7 of 8 series · 37 of 48 positions shown · non-contrast
Comparison: None.

CLINICAL DATA: Right facial droop

EXAM:
MRI HEAD WITHOUT CONTRAST
MRA HEAD WITHOUT CONTRAST
TECHNIQUE: Multiplanar, multiecho pulse sequences of the brain and surrounding
structures were obtained without intravenous contrast. Angiographic
images of the head were obtained using MRA technique without
contrast.

[Series 3: DWI · axial · 3.0mm · 1.09mm/px · z∈[-21,+115]mm · 10 of 94 slices shown (1 of 4)]
[im 1/94]
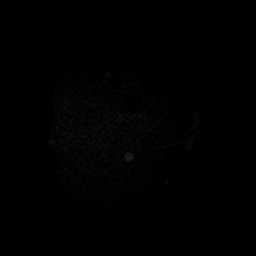
[im 11/94]
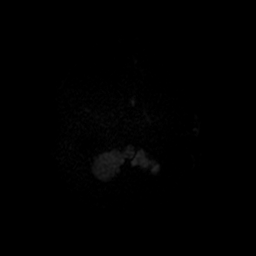
[im 21/94]
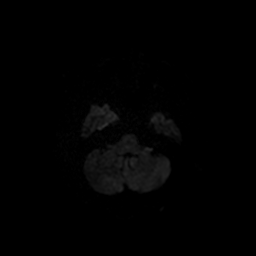
[im 32/94]
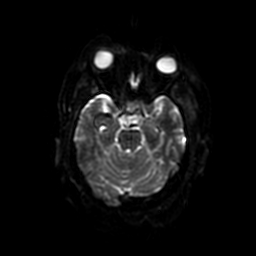
[im 42/94]
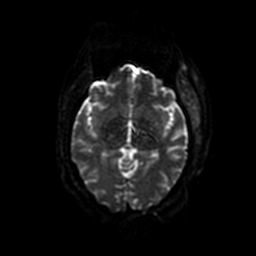
[im 52/94]
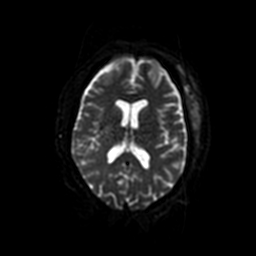
[im 63/94]
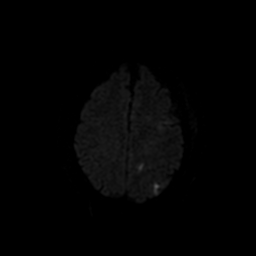
[im 73/94]
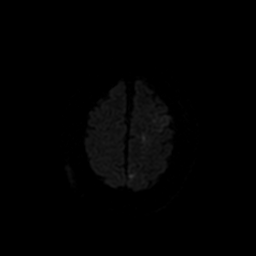
[im 83/94]
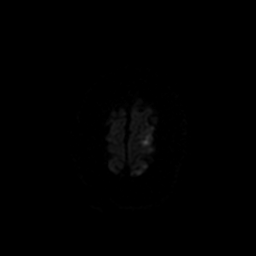
[im 94/94]
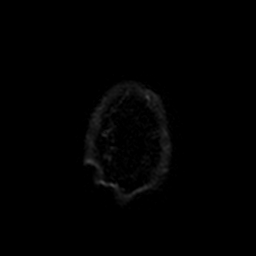

[Series 4: DWI · coronal · 5.0mm · 1.09mm/px · 7 of 70 slices shown (2 of 4)]
[im 1/70]
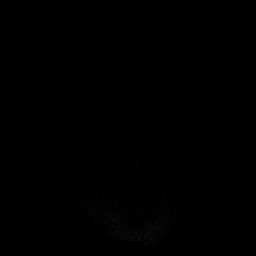
[im 12/70]
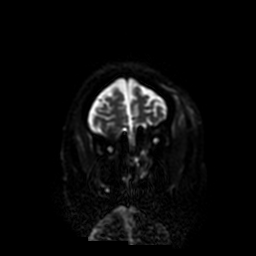
[im 24/70]
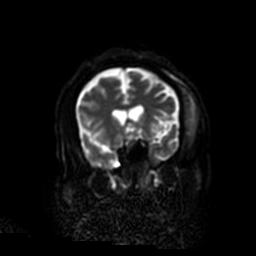
[im 35/70]
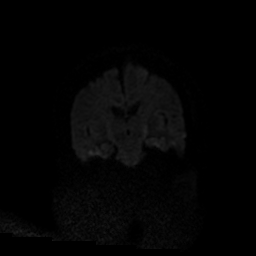
[im 47/70]
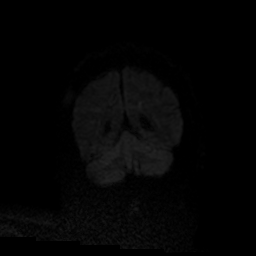
[im 58/70]
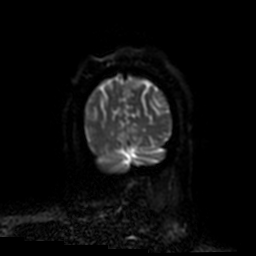
[im 70/70]
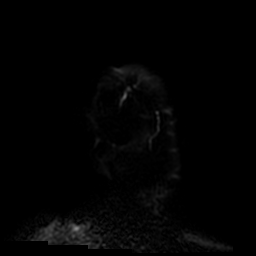

[Series 5: (id) mt fs · axial · 1.4mm · 0.43mm/px · z∈[-48,+44]mm · 9 of 136 slices shown]
[im 1/136]
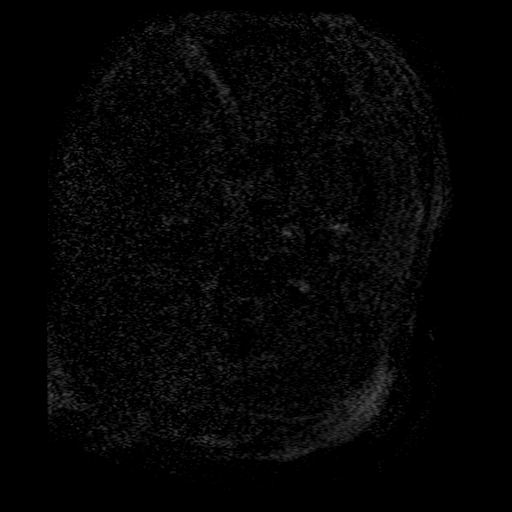
[im 11/136]
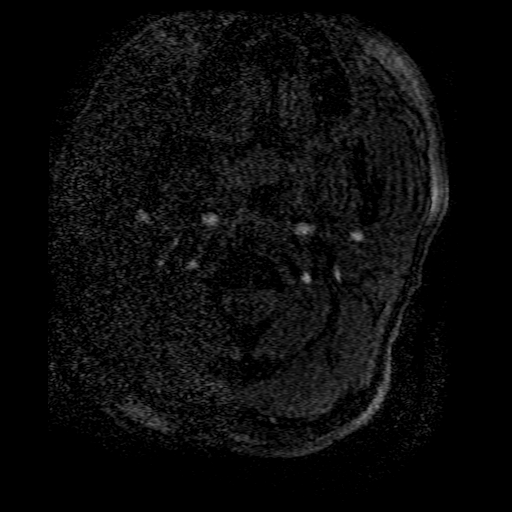
[im 21/136]
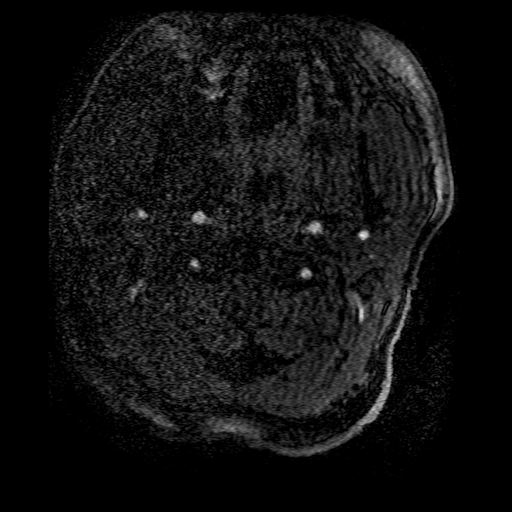
[im 42/136]
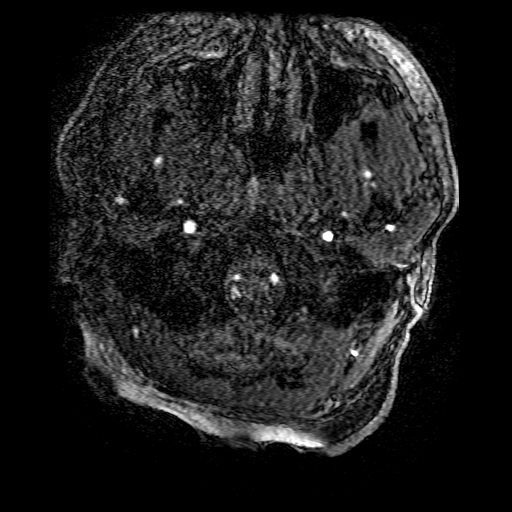
[im 63/136]
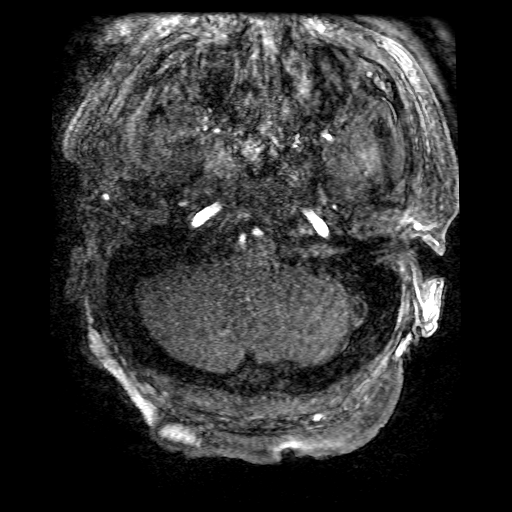
[im 73/136]
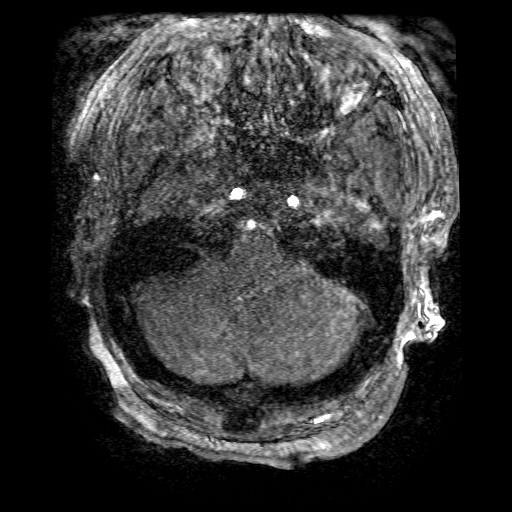
[im 94/136]
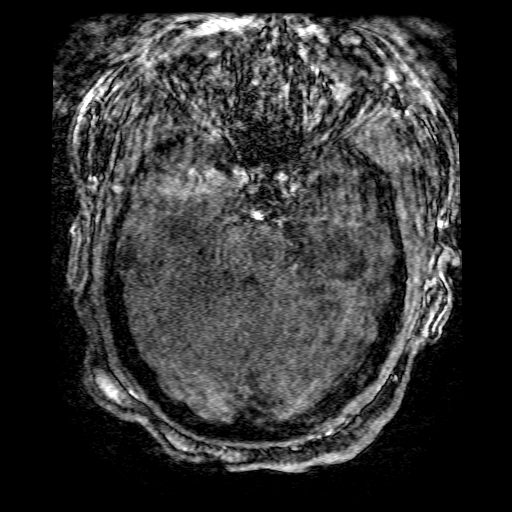
[im 115/136]
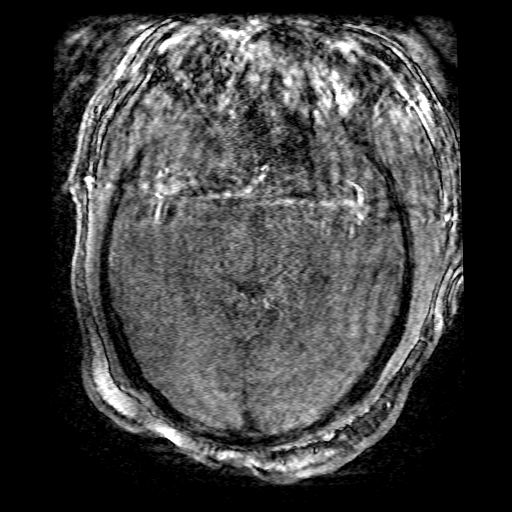
[im 136/136]
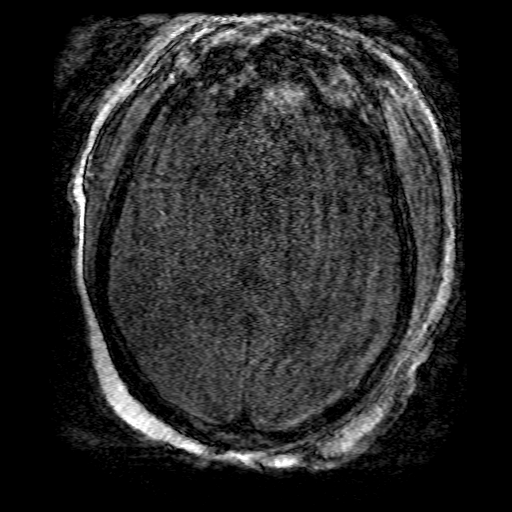

[Series 300: DWI · axial · 3.0mm · 1.09mm/px · z∈[-21,+115]mm · 5 of 47 slices shown (3 of 4)]
[im 1/47]
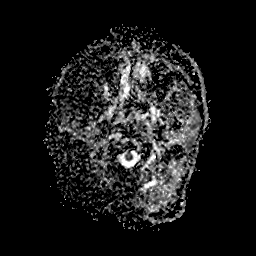
[im 12/47]
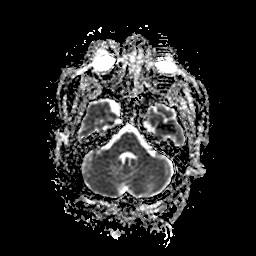
[im 24/47]
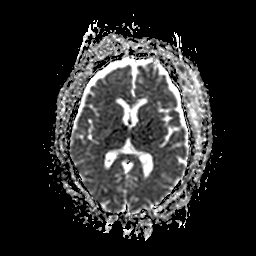
[im 35/47]
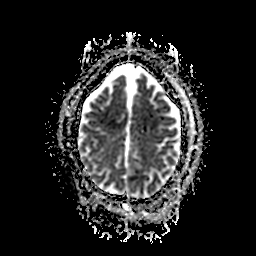
[im 47/47]
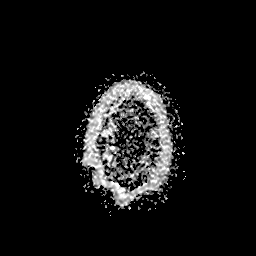

[Series 400: DWI · coronal · 5.0mm · 1.09mm/px · 4 of 35 slices shown (4 of 4)]
[im 1/35]
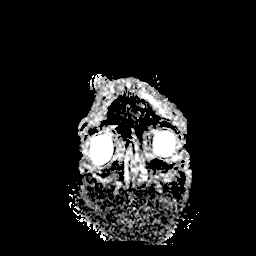
[im 12/35]
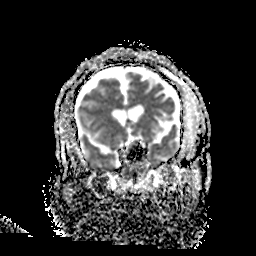
[im 23/35]
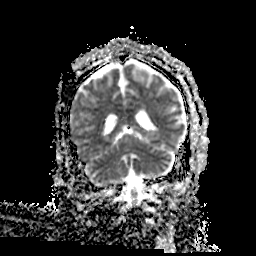
[im 35/35]
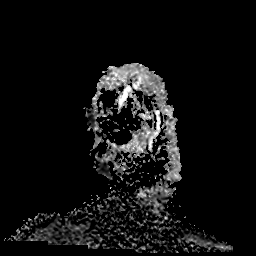

[Series 500: col:(id) mt fs · axial · 1.4mm · 0.43mm/px · 1 of 1 slices shown]
[im 1/1]
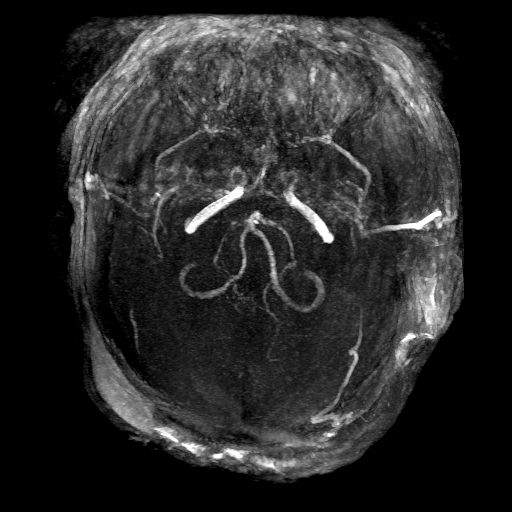

[Series 505: projection images · axial · 1.4mm · 0.27mm/px · 1 of 6 slices shown]
[im 1/6]
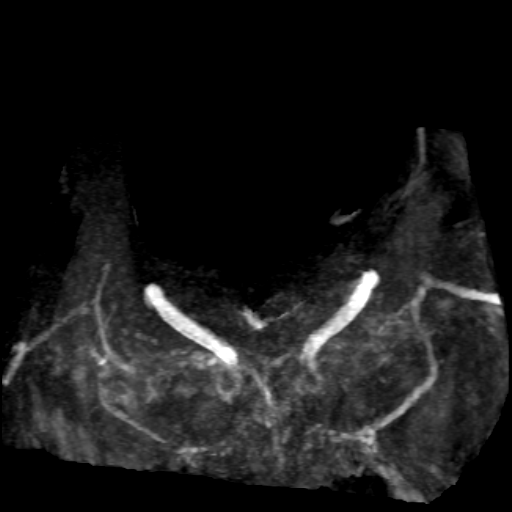

[37 of 48 positions shown; findings below may reference images not displayed]

FINDINGS: MRI HEAD

Only diffusion-weighted imaging was performed as patient could not
tolerate remainder. There are cortical and white matter foci of
mildly reduced diffusion in the left frontoparietal lobes.
Ventricles are normal in size. There is no mass effect.

MRA HEAD

Significant motion degradation. Intracranial internal carotid
arteries appear to be patent. Middle, anterior, and posterior
cerebral arteries are poorly evaluated. Intracranial vertebral and
basilar arteries appear to be patent.
IMPRESSION: Partial, single sequence study demonstrates multiple small acute
infarcts of the left frontoparietal lobes.

Significantly motion degraded vascular imaging. Intracranial left
ICA appears patent. Left middle and anterior cerebral arteries are
poorly evaluated.

## 2020-02-06 IMAGING — CT CT ANGIO NECK
2 of 7 series · 8 of 33 positions shown · IV contrast (OMNI 350)
Comparison: Head CT and MRI of the brain [DATE].

CLINICAL DATA: Stroke suspected.

EXAM:
CT ANGIOGRAPHY HEAD AND NECK
TECHNIQUE: Multidetector CT imaging of the head and neck was performed using
the standard protocol during bolus administration of intravenous
contrast. Multiplanar CT image reconstructions and MIPs were
obtained to evaluate the vascular anatomy. Carotid stenosis
measurements (when applicable) are obtained utilizing NASCET
criteria, using the distal internal carotid diameter as the
denominator.
CONTRAST:  <See Chart> OMNIPAQUE IOHEXOL 350 MG/ML SOLN

[Series 3: cta neck · axial · 0.47mm/px · z∈[-259,-141]mm · 2 of 178 slices shown]
[im 60/178  soft-tissue]
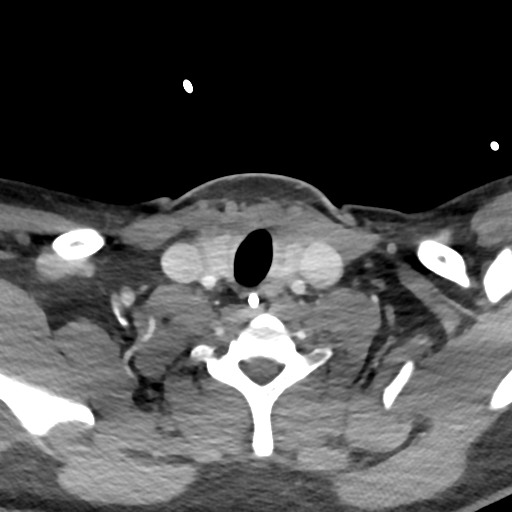
[im 119/178  soft-tissue]
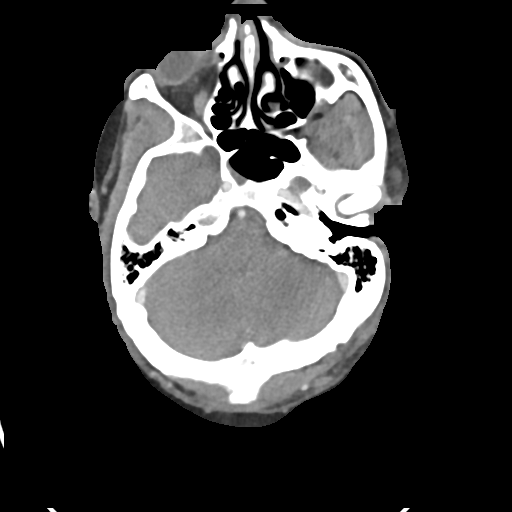

[Series 5: cta neck axial · axial · 0.39mm/px · z∈[-327,-77]mm · 6 of 351 slices shown]
[im 51/351  soft-tissue]
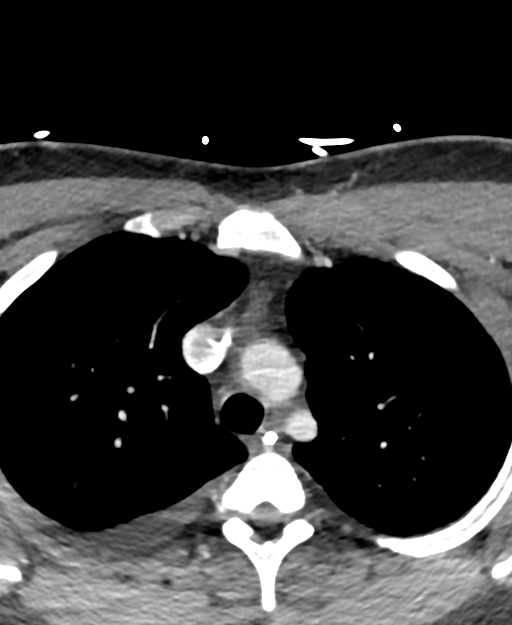
[im 101/351  bone]
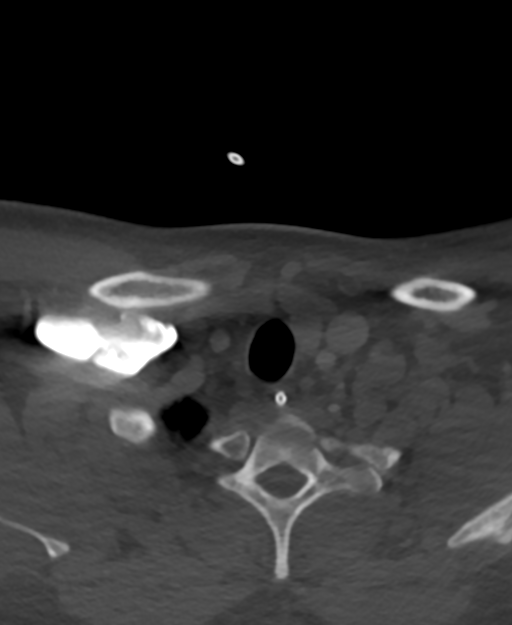
[im 151/351  soft-tissue]
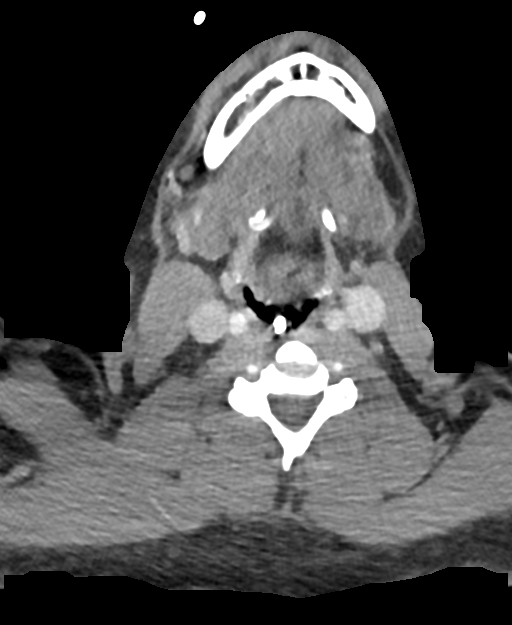
[im 201/351  bone]
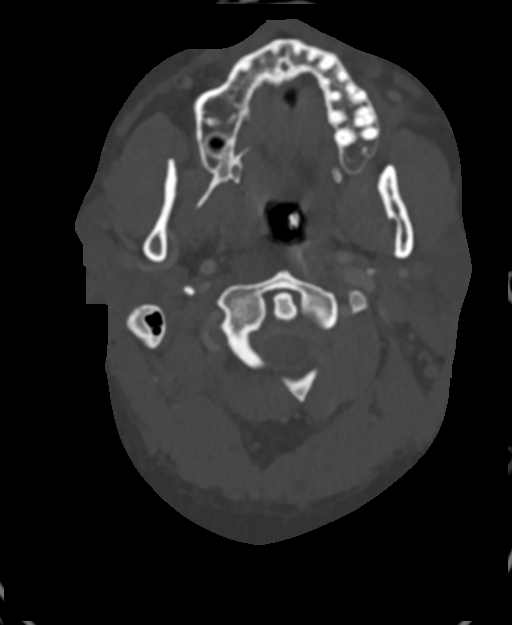
[im 251/351  soft-tissue]
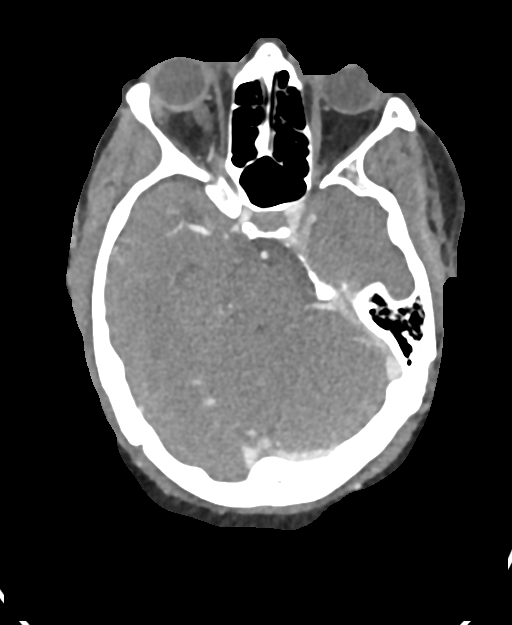
[im 301/351  bone]
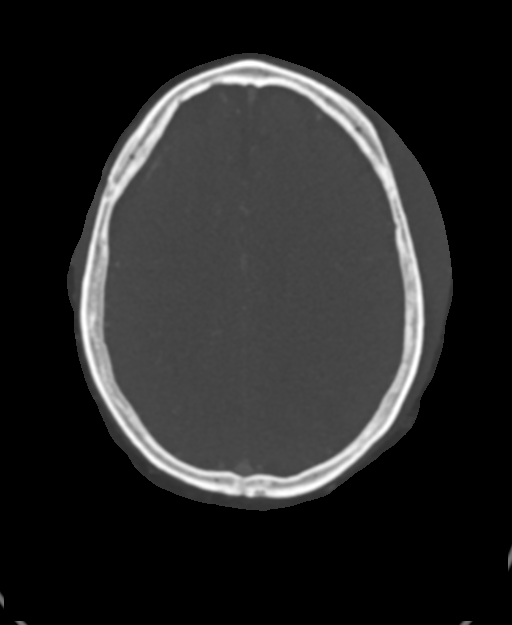

[8 of 33 positions shown; findings below may reference images not displayed]

FINDINGS: The study is limited by motion.

CTA NECK FINDINGS

Aortic arch: Standard branching. Imaged portion shows no evidence of
aneurysm or dissection. No significant stenosis of the major arch
vessel origins.

Right carotid system: No evidence of dissection, stenosis (50% or
greater) or occlusion.

Left carotid system: No evidence of dissection, stenosis (50% or
greater) or occlusion.

Vertebral arteries: Codominant. No evidence of dissection, stenosis
(50% or greater) or occlusion.

Skeleton: Negative.

Other neck: Negative.

Upper chest: Right pleural effusion and right-sided consolidation.
Please refer to dedicated chest CT for further details.

Review of the MIP images confirms the above findings

CTA HEAD FINDINGS

Anterior circulation: No significant stenosis, proximal occlusion,
aneurysm, or vascular malformation.

Posterior circulation: No significant stenosis, proximal occlusion,
aneurysm, or vascular malformation.

Venous sinuses: As permitted by contrast timing, patent.

Review of the MIP images confirms the above findings
IMPRESSION: 1. Examination limited by motion.
2. No large vessel occlusion, hemodynamically significant stenosis,
or evidence of dissection.
3. Right pleural effusion and right-sided consolidation. Please
refer to dedicated chest CT for further details.

## 2020-02-06 IMAGING — CT CT ANGIO HEAD
2 of 7 series · 8 of 33 positions shown · IV contrast (OMNI 350)
Comparison: Head CT and MRI of the brain [DATE].

CLINICAL DATA: Stroke suspected.

EXAM:
CT ANGIOGRAPHY HEAD AND NECK
TECHNIQUE: Multidetector CT imaging of the head and neck was performed using
the standard protocol during bolus administration of intravenous
contrast. Multiplanar CT image reconstructions and MIPs were
obtained to evaluate the vascular anatomy. Carotid stenosis
measurements (when applicable) are obtained utilizing NASCET
criteria, using the distal internal carotid diameter as the
denominator.
CONTRAST:  <See Chart> OMNIPAQUE IOHEXOL 350 MG/ML SOLN

[Series 3: cta neck · axial · 0.47mm/px · z∈[-259,-141]mm · 2 of 178 slices shown]
[im 60/178  soft-tissue]
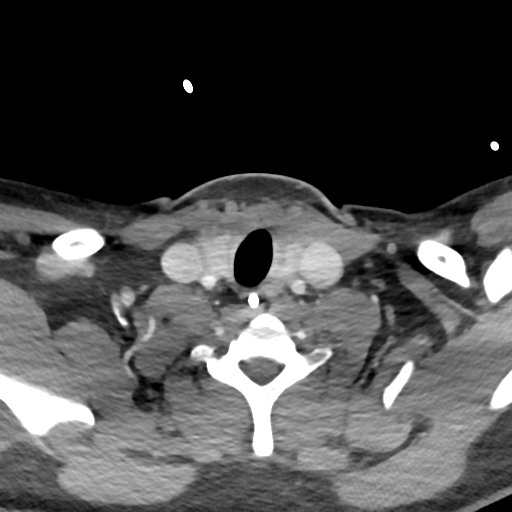
[im 119/178  soft-tissue]
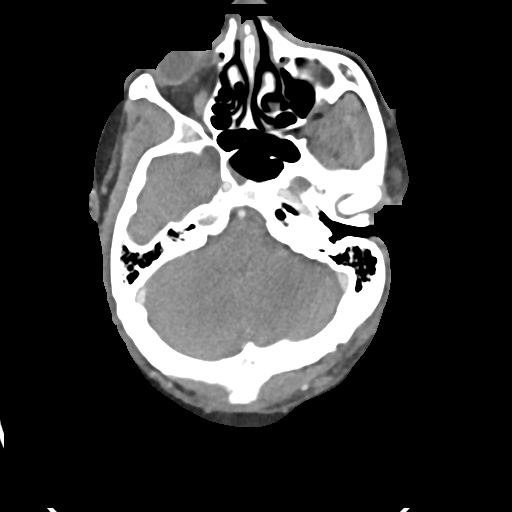

[Series 5: cta neck axial · axial · 0.39mm/px · z∈[-327,-77]mm · 6 of 351 slices shown]
[im 51/351  soft-tissue]
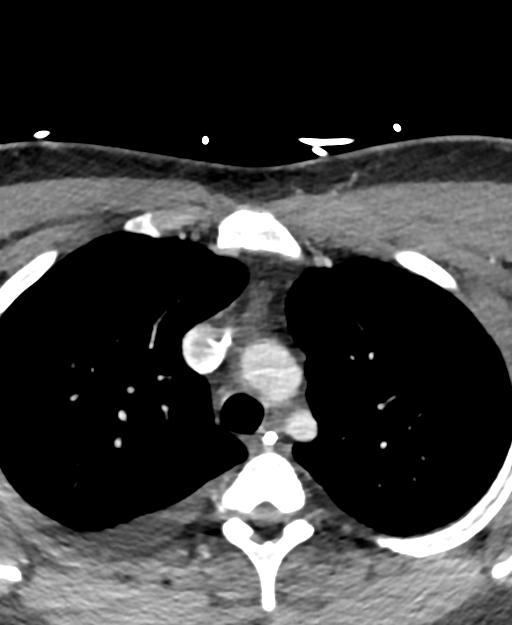
[im 101/351  bone]
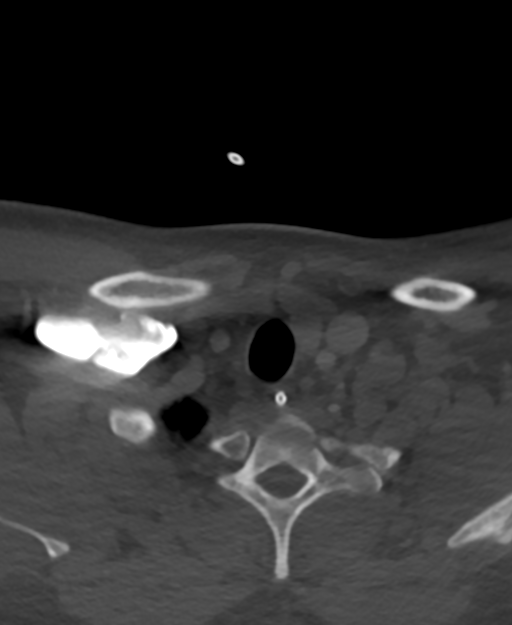
[im 151/351  soft-tissue]
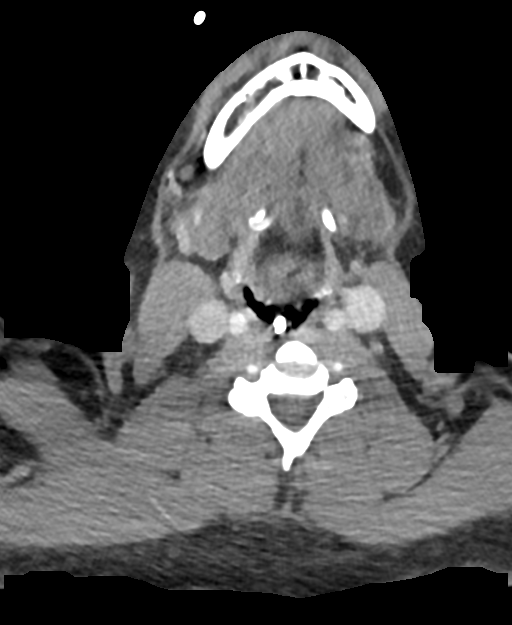
[im 201/351  bone]
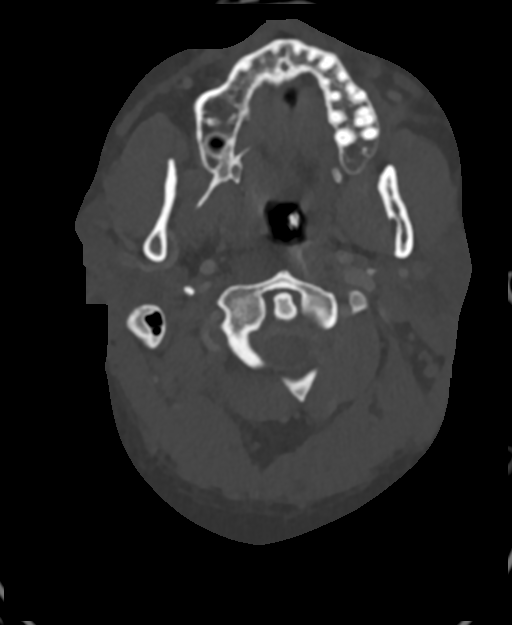
[im 251/351  soft-tissue]
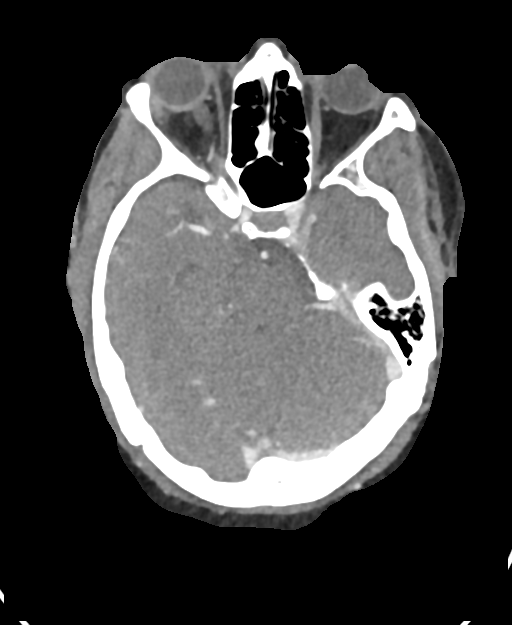
[im 301/351  bone]
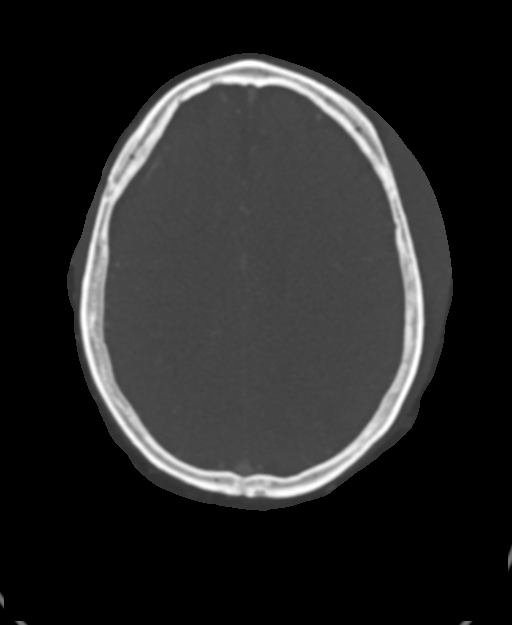

[8 of 33 positions shown; findings below may reference images not displayed]

FINDINGS: The study is limited by motion.

CTA NECK FINDINGS

Aortic arch: Standard branching. Imaged portion shows no evidence of
aneurysm or dissection. No significant stenosis of the major arch
vessel origins.

Right carotid system: No evidence of dissection, stenosis (50% or
greater) or occlusion.

Left carotid system: No evidence of dissection, stenosis (50% or
greater) or occlusion.

Vertebral arteries: Codominant. No evidence of dissection, stenosis
(50% or greater) or occlusion.

Skeleton: Negative.

Other neck: Negative.

Upper chest: Right pleural effusion and right-sided consolidation.
Please refer to dedicated chest CT for further details.

Review of the MIP images confirms the above findings

CTA HEAD FINDINGS

Anterior circulation: No significant stenosis, proximal occlusion,
aneurysm, or vascular malformation.

Posterior circulation: No significant stenosis, proximal occlusion,
aneurysm, or vascular malformation.

Venous sinuses: As permitted by contrast timing, patent.

Review of the MIP images confirms the above findings
IMPRESSION: 1. Examination limited by motion.
2. No large vessel occlusion, hemodynamically significant stenosis,
or evidence of dissection.
3. Right pleural effusion and right-sided consolidation. Please
refer to dedicated chest CT for further details.

## 2020-02-06 IMAGING — CT CT CHEST W/ CM
2 of 5 series · 10 of 36 positions shown, 12 images · IV contrast (omnipaque)
Comparison: CT [DATE]

CLINICAL DATA: New fever, possible abscess

EXAM:
CT CHEST, ABDOMEN, AND PELVIS WITH CONTRAST
TECHNIQUE: Multidetector CT imaging of the chest, abdomen and pelvis was
performed following the standard protocol during bolus
administration of intravenous contrast.
CONTRAST:  100mL OMNIPAQUE IOHEXOL 350 MG/ML SOLN

[Series 7: cap with 5mm st · axial · 0.89mm/px · z∈[-867,-292]mm · 7 of 141 slices shown, 9 images]
[im 13/141  mediastinal]
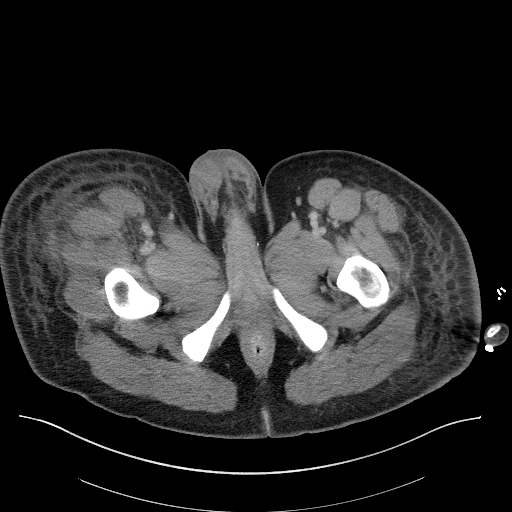
[im 13/141  lung]
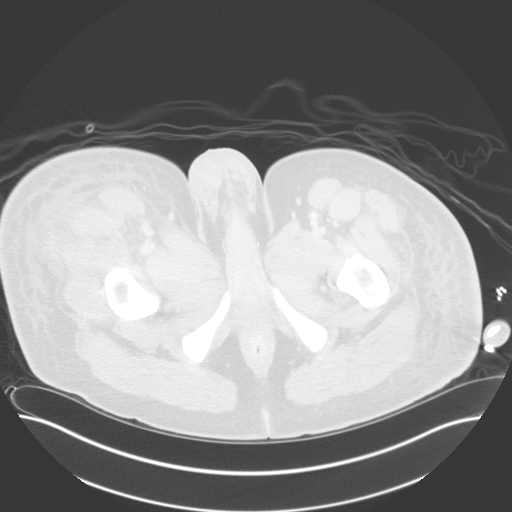
[im 39/141  lung]
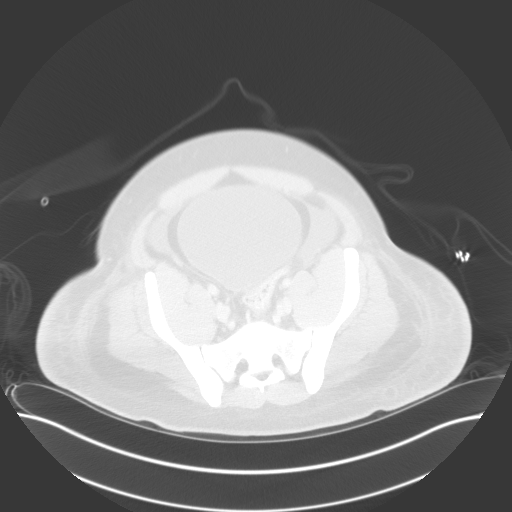
[im 51/141  lung]
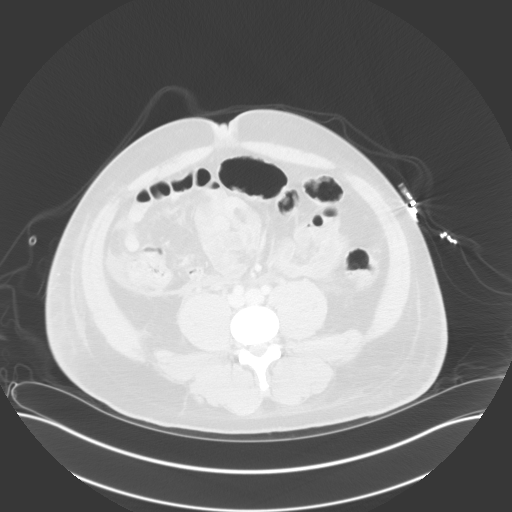
[im 77/141  lung]
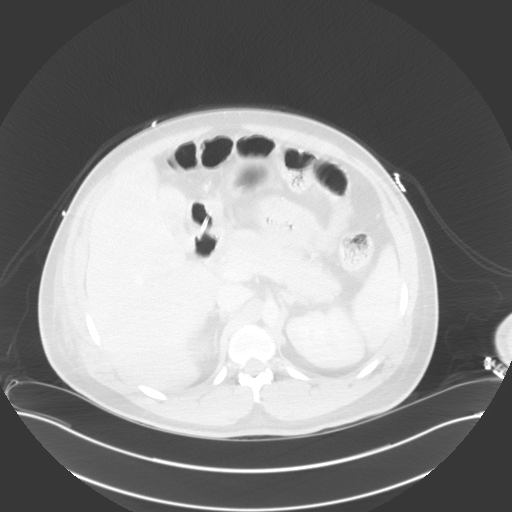
[im 90/141  mediastinal]
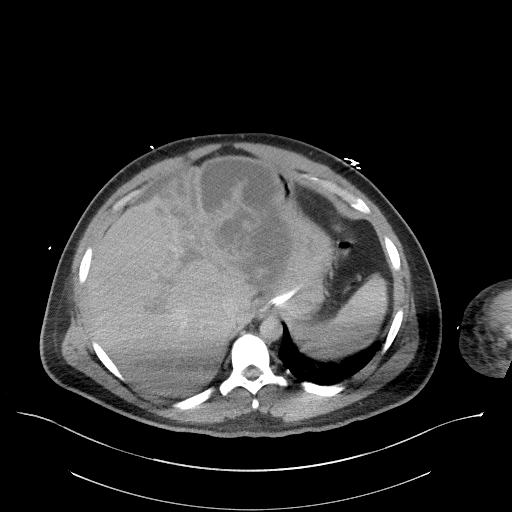
[im 90/141  lung]
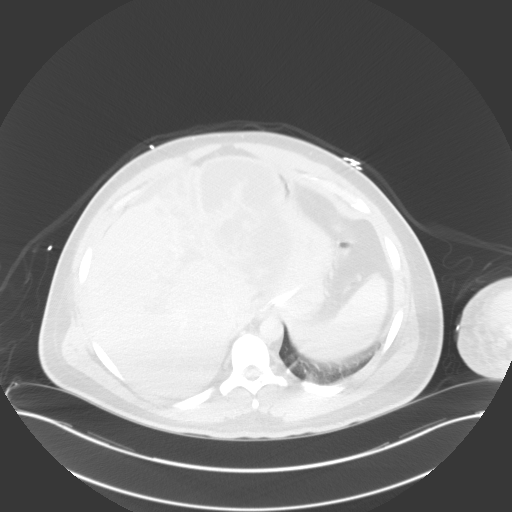
[im 102/141  lung]
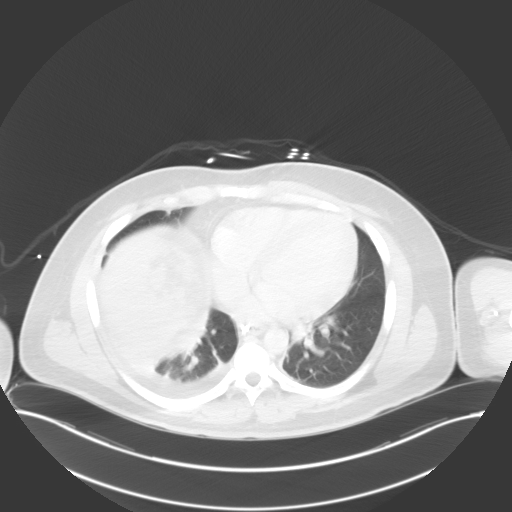
[im 128/141  lung]
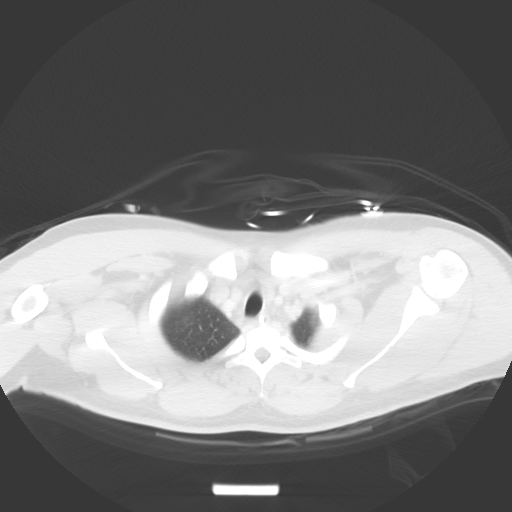

[Series 9: cap with 3mm st cor · coronal · 0.68mm/px · 3 of 151 slices shown]
[im 31/151  lung]
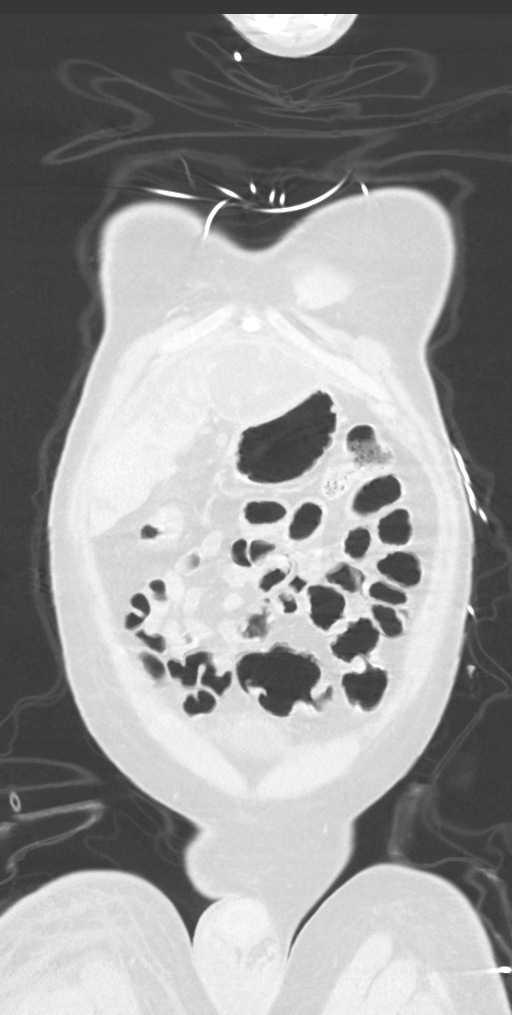
[im 61/151  lung]
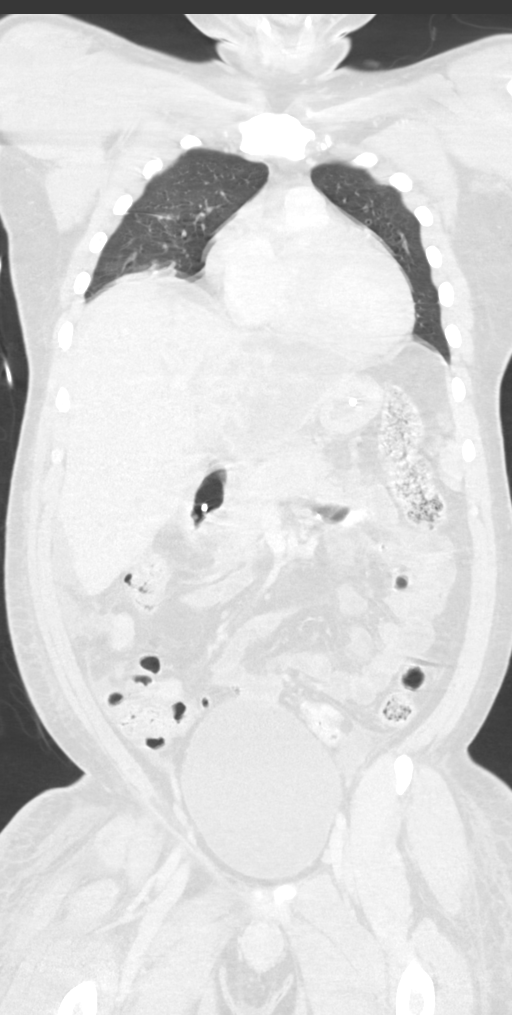
[im 91/151  lung]
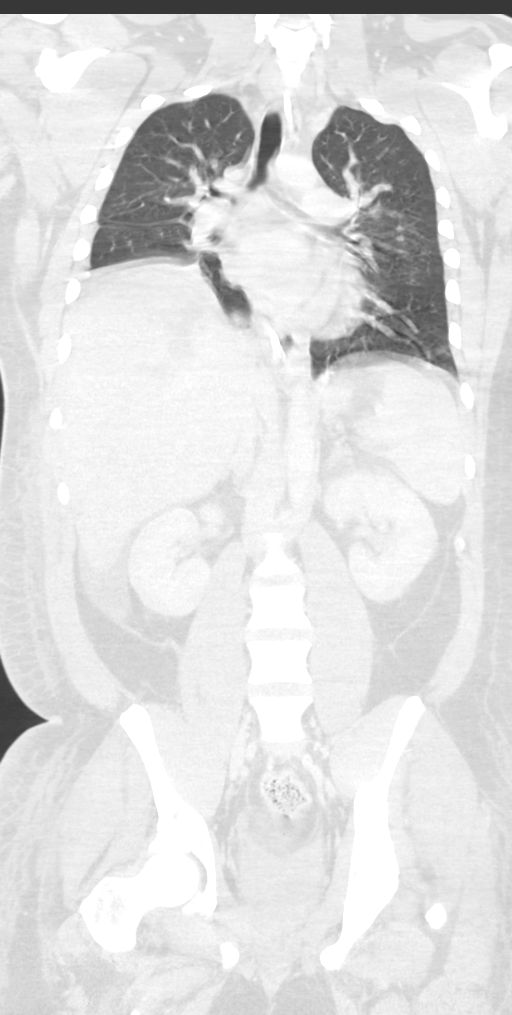

[10 of 36 positions shown; findings below may reference images not displayed]

FINDINGS: CT CHEST FINDINGS

Cardiovascular: Normal heart size. No pericardial effusion. The
aortic root is suboptimally assessed given cardiac pulsation
artifact. The aorta is normal caliber. No gross acute aortic
abnormality. No periaortic stranding or hemorrhage. Normal 3 vessel
branching of the aortic arch. Proximal great vessels are
unremarkable. Central pulmonary artery caliber. Motion artifact and
non tailored technique significantly limit evaluation for pulmonary
artery emboli. Left upper extremity PICC terminates at the superior
cavoatrial junction. Mild soft tissue stranding and skin thickening
near the IV access site along the medial left upper arm.

Mediastinum/Nodes: No mediastinal fluid or gas. Normal thyroid gland
and thoracic inlet. No acute abnormality of the trachea or
esophagus. Transesophageal tube remains in place. No worrisome
mediastinal, hilar or axillary adenopathy.

Lungs/Pleura: Small right pleural effusion. Some adjacent passive
atelectatic changes in subsegmental atelectasis in the right lower
lobe. More consolidative opacity in the right lower lobe, residual
underlying consolidation/aspiration is difficult to exclude. No left
effusion. No pneumothorax.

Musculoskeletal: No acute or worrisome osseous or soft tissue
abnormality of the chest wall. Soft tissue stranding along the
medial left upper arm near the IV access site, as detailed above.
This is increasing from comparison CT.

CT ABDOMEN PELVIS FINDINGS

Hepatobiliary: Corresponding well to regions of hypoenhancement on
the comparison CT from [DATE] is developing necrosis of much of
the left lobe liver. Sparing of the hepatic capsule is noted.
Superinfection/developing posttraumatic abscess cannot be fully
excluded on imaging alone. Additionally, there has been evolution of
laceration predominantly extending through segments 4 and 8 of the
liver anterior lesser extent segment 6 posteriorly.

Gallbladder is nondistended but does contain some intermediate
attenuation material which could reflect biliary sludge or vicarious
extravasation of contrast. Some trace pericholecystic fluid is
nonspecific given adjacent perihepatic fluid as well.

Pancreas: No pancreatic contusive changes or ductal disruption. No
pancreatic ductal dilatation or surrounding inflammatory changes.

Spleen: Region of previously seen splenic laceration is less well
visualized due to motion artifact. No new splenic abnormality.
Spleen is normal size.

Adrenals/Urinary Tract: No adrenal hemorrhage or suspicious adrenal
lesion. Wedge-shaped region of hypoattenuation interpolar to lower
pole left kidney, likely reflecting a developing area of renal
infarct. No other concerning renal lesion. No perinephric stranding
or hemorrhage. No urolithiasis or hydronephrosis. Bladder distension
without evidence of direct bladder injury or other acute bladder
abnormality.

Stomach/Bowel: Transesophageal tube tip terminates in the 1st to 2nd
portion of the duodenum. Side port within the distal gastric body.
Distal thoracic esophagus, stomach and duodenum are otherwise
unremarkable. No small bowel thickening or dilatation. A normal
appendix is visualized. No colonic dilatation or wall thickening.

Vascular/Lymphatic: Apparent devascularization of the left lobe
liver and hepatic last a changes. The clear site of vascular
disruption is not evident. Similarly, no visible vascular
abnormality is seen of the renal arteries or veins within the
limitations of this motion degraded, non tailored exam. No other
acute or suspicious osseous abnormality. No suspicious or enlarged
lymph nodes in the included lymphatic chains.

Reproductive: The prostate and seminal vesicles are unremarkable.

Other: Small to moderate volume low to intermediate attenuation free
fluid layering in the abdomen and pelvis measuring between 15-20
Hounsfield units with a predominance towards the right lobe liver
tip and in the lower pericolic gutters. Diffuse body wall edema
noted as well predominantly along the flanks, right abdominal wall
and bilateral hips.

Musculoskeletal: No acute lumbosacral osseous abnormality. Portions
of the bony pelvis are intact and congruent. Proximal femora are
intact with femoral heads normally located. Asymmetric stranding and
thickening about the musculature of the anterior compartment right
thigh.
IMPRESSION: 1. Imaging quality is significantly degraded by patient motion
artifact.
2. Persistent consolidation in the right lower lobe, could reflect
atelectasis versus residual aspiration and/or consolidation. New
small right pleural effusion as well with some adjacent passive
atelectatic changes.
3. Multi directional laceration with interval evolution extending
through segments 4 and 8 of the liver as well as segment 6
posteriorly. More diffuse hypoattenuation corresponding well to
regions of hypoenhancement throughout the left lobe liver likely
reflect developing hepatic necrosis involving much of the left lobe
liver. Superinfection cannot be fully excluded on imaging alone.
4. Wedge-shaped region of hypoattenuation in the interpolar to lower
pole left kidney, likely reflecting a developing area of renal
infarct.
5. Suboptimal visualization of a previously seen splenic laceration.
6. Small to moderate volume low to intermediate attenuation free
fluid layering in the abdomen and pelvis measuring between 15-20
Hounsfield units with a predominance towards the right lobe liver
tip and in the lower pericolic gutters. Findings are nonspecific but
could reflect a combination of simple fluid and blood products.
7. Nonspecific though increasing stranding and thickening about the
musculature of the anterior compartment right thigh, correlate with
exam findings.
8. Transesophageal tube tip terminates in the 1st to 2nd portion of
the duodenum. Side port within the distal gastric body.
9. Mild soft tissue stranding and skin thickening near the IV access
site along the medial left upper arm. Correlate with visual
inspection.

## 2020-02-06 MED ORDER — ACETAMINOPHEN 650 MG RE SUPP
650.0000 mg | RECTAL | Status: DC | PRN
  Administered 2020-02-07 – 2020-02-10 (×2): 650 mg via RECTAL
  Filled 2020-02-06 (×3): qty 1

## 2020-02-06 MED ORDER — IOHEXOL 350 MG/ML SOLN
100.0000 mL | Freq: Once | INTRAVENOUS | Status: AC | PRN
  Administered 2020-02-06: 100 mL via INTRAVENOUS

## 2020-02-06 MED ORDER — DEXMEDETOMIDINE HCL IN NACL 400 MCG/100ML IV SOLN
0.4000 ug/kg/h | INTRAVENOUS | Status: AC
  Administered 2020-02-06: 0.8 ug/kg/h via INTRAVENOUS
  Filled 2020-02-06 (×3): qty 100

## 2020-02-06 MED ORDER — HEPARIN (PORCINE) 25000 UT/250ML-% IV SOLN
1950.0000 [IU]/h | INTRAVENOUS | Status: DC
  Administered 2020-02-06: 750 [IU]/h via INTRAVENOUS
  Administered 2020-02-07 (×2): 1100 [IU]/h via INTRAVENOUS
  Administered 2020-02-09: 1900 [IU]/h via INTRAVENOUS
  Administered 2020-02-09: 2050 [IU]/h via INTRAVENOUS
  Administered 2020-02-10 – 2020-02-11 (×2): 1950 [IU]/h via INTRAVENOUS
  Filled 2020-02-06 (×8): qty 250

## 2020-02-06 MED ORDER — LORAZEPAM 1 MG PO TABS: 1.0000 mg | ORAL_TABLET | ORAL | Status: DC | PRN

## 2020-02-06 MED ORDER — LORAZEPAM 2 MG/ML IJ SOLN
1.0000 mg | INTRAMUSCULAR | Status: DC | PRN
  Administered 2020-02-06 – 2020-02-07 (×5): 2 mg via INTRAVENOUS
  Administered 2020-02-07: 4 mg via INTRAVENOUS
  Administered 2020-02-07: 2 mg via INTRAVENOUS
  Filled 2020-02-06 (×4): qty 1
  Filled 2020-02-06: qty 2
  Filled 2020-02-06: qty 1
  Filled 2020-02-06: qty 2

## 2020-02-06 NOTE — Progress Notes (Signed)
Patient has decreased movement on right upper and lower extremity.I expressed my concerns to Dr. Bedelia Person.CT Head ordered. Ripley Fraise D

## 2020-02-06 NOTE — Progress Notes (Signed)
CT results given to Dr. Bedelia Person. Will continue to monitor. Christopher Marks

## 2020-02-06 NOTE — TOC Progression Note (Signed)
Transition of Care [facility]) - Progression Note    Patient Details  Name: Christopher Marks MRN: [id number] Date of Birth: 1990/05/23  Transition of Care [facility]) CM/SW Contact  Glennon Mac, RN Phone Number: 02/06/2020, 4:28 PM  Clinical Narrative:  Prepared and faxed excuse for upcoming court date on 02-17-2020 in [facility], per pt's grandmother's request.  Fax # [phone]    Expected Discharge Plan: IP Rehab Facility Barriers to Discharge: Continued Medical Work up  Expected Discharge Plan and Services Expected Discharge Plan: IP Rehab Facility   Discharge Planning Services: CM Consult   Living arrangements for the past 2 months: Single Family Home                                       Social Determinants of Health (SDOH) Interventions    Readmission Risk Interventions No flowsheet data found.  Quintella Baton, RN, BSN  Trauma/Neuro ICU Case Manager [phone]

## 2020-02-06 NOTE — Progress Notes (Signed)
ANTICOAGULATION CONSULT NOTE - Initial Consult  Pharmacy Consult for IV heparin Indication: UE DVT, stroke  No Known Allergies  Patient Measurements: Height: 5\' 7"  (170.2 cm) Weight: 97.1 kg (214 lb 1.1 oz) (Bed scale) IBW/kg (Calculated) : 66.1 Heparin Dosing Weight: 77 kg  Vital Signs: Temp: 98.2 F (36.8 C) (10/25 0800) Temp Source: Axillary (10/25 0800) BP: 156/67 (10/25 1400) Pulse Rate: 103 (10/25 1400)  Labs: Recent Labs    02/04/20 0500 02/04/20 0500 02/05/20 0530 02/06/20 0618 02/06/20 1009  HGB 7.5*   < > 7.8* 7.2*  --   HCT 23.2*  --  24.3* 23.2*  --   PLT 328  --  450* 482*  --   CREATININE 1.46*  --  1.28*  --  1.26*   < > = values in this interval not displayed.    Estimated Creatinine Clearance: 97.8 mL/min (A) (by C-G formula based on SCr of 1.26 mg/dL (H)).   Medical History: History reviewed. No pertinent past medical history.  Medications:  Infusions:  . sodium chloride Stopped (02/01/20 1518)  . dextrose 5 % and 0.45% NaCl 50 mL/hr at 02/06/20 1400  . feeding supplement (VITAL 1.5 CAL) 1,000 mL (02/05/20 1925)  . heparin    . piperacillin-tazobactam (ZOSYN)  IV 3.375 g (02/06/20 1528)  . vancomycin 1,000 mg (02/06/20 1339)    Assessment: 29 yo male with new stroke, UE DVT and anemia.  Pharmacy asked to cautiously start IV heparin.     Goal of Therapy:  Heparin level 0.3-0.5 stroke dosing Monitor platelets by anticoagulation protocol: Yes   Plan:  Start IV heparin at 750 units/hr, no bolus. Check heparin level 6 hrs after gtt starts. Daily heparin level and CBC.  34, Reece Leader, BCCP Clinical Pharmacist  02/06/2020 3:47 PM   [facility] pharmacy phone numbers are listed on [email or web address]

## 2020-02-06 NOTE — Progress Notes (Signed)
Spoke with Dr. Bedelia Person regarding ability to obtain 1 PIV in right arm.  Also updated that it is against policy to place midline or PICC in flaccid arm as well as arm with DVT.  Dr. Bedelia Person aware that PICC remains in as it has medication infusing at this time.  Dr. Bedelia Person requested that PICC remain in.  She will update bedside nurse.

## 2020-02-06 NOTE — Progress Notes (Signed)
Bilateral lower extremity and upper extremity venous duplex completed. Refer to "CV Proc" under chart review to view preliminary results.  02/06/2020 2:57 PM Eula Fried., MHA, RVT, RDCS, RDMS

## 2020-02-06 NOTE — Progress Notes (Signed)
SLP Cancellation Note  Patient Details Name: Christopher Marks MRN: [id number] DOB: 1990-07-12   Cancelled treatment:       Reason Eval/Treat Not Completed: Patient at procedure or test/unavailable (off the floor for CT). Will f/u as able for ongoing swallowing tx, noting that pt was started on a PO diet this morning. Per RN, mentation is improved and he was swallowing thin liquids well this morning.    Mahala Menghini., M.A. CCC-SLP Acute Rehabilitation Services Pager [phone] Office [phone]  02/06/2020, 8:59 AM

## 2020-02-06 NOTE — Progress Notes (Addendum)
[place] PIV inserted in right medial forearm at request of Dr. Bedelia Person despite superficial clot and flaccid arm for IV access at this time.  RN to d/c the upper arm PIV.  Arm circumference is 35 cm measuring at point of insertion.  Maxine Glenn, RN will consult VAST when she is ready for PICC to be d/c'd as patient currently has antibiotics infusing.

## 2020-02-06 NOTE — Plan of Care (Signed)
  Problem: Nutrition: Goal: Adequate nutrition will be maintained Outcome: Progressing   

## 2020-02-06 NOTE — Progress Notes (Signed)
  Echocardiogram 2D Echocardiogram has been performed.  Pieter Partridge 02/06/2020, 10:56 AM

## 2020-02-06 NOTE — Progress Notes (Signed)
Attempted bilateral upper and lower extremity venous duplex, however patient is not in his room. Will attempt again as schedule permits.  02/06/2020 11:32 AM Eula Fried., MHA, RVT, RDCS, RDMS

## 2020-02-06 NOTE — Progress Notes (Signed)
Patient removed cortrak while vomiting, MD made aware.  Orders given for RE tylenol for fever and to hold all other per tube meds until morning.
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
## 2020-02-07 DIAGNOSIS — I6310 Cerebral infarction due to embolism of unspecified precerebral artery: Secondary | ICD-10-CM

## 2020-02-07 LAB — BASIC METABOLIC PANEL
Anion gap: 10 (ref 5–15)
BUN: 26 mg/dL — ABNORMAL HIGH (ref 6–20)
CO2: 18 mmol/L — ABNORMAL LOW (ref 22–32)
Calcium: 8.2 mg/dL — ABNORMAL LOW (ref 8.9–10.3)
Chloride: 115 mmol/L — ABNORMAL HIGH (ref 98–111)
Creatinine, Ser: 1.16 mg/dL (ref 0.61–1.24)
GFR, Estimated: >60 mL/min (ref >60)
Glucose, Bld: 105 mg/dL — ABNORMAL HIGH (ref 70–99)
Potassium: 4.6 mmol/L (ref 3.5–5.1)
Sodium: 143 mmol/L (ref 135–145)

## 2020-02-07 LAB — GLUCOSE, CAPILLARY
Glucose-Capillary: 119 mg/dL — ABNORMAL HIGH (ref 70–99)
Glucose-Capillary: 86 mg/dL (ref 70–99)
Glucose-Capillary: 91 mg/dL (ref 70–99)
Glucose-Capillary: 101 mg/dL — ABNORMAL HIGH (ref 70–99)
Glucose-Capillary: 100 mg/dL — ABNORMAL HIGH (ref 70–99)
Glucose-Capillary: 101 mg/dL — ABNORMAL HIGH (ref 70–99)

## 2020-02-07 LAB — CULTURE, BLOOD (ROUTINE X 2)
Culture: NO GROWTH
Culture: NO GROWTH
Special Requests: ADEQUATE

## 2020-02-07 LAB — CBC
HCT: 23.9 % — ABNORMAL LOW (ref 39.0–52.0)
Hemoglobin: 7.5 g/dL — ABNORMAL LOW (ref 13.0–17.0)
MCH: 27.7 pg (ref 26.0–34.0)
MCHC: 31.4 g/dL (ref 30.0–36.0)
MCV: 88.2 fL (ref 80.0–100.0)
Platelets: 616 10*3/uL — ABNORMAL HIGH (ref 150–400)
RBC: 2.71 MIL/uL — ABNORMAL LOW (ref 4.22–5.81)
RDW: 15.1 % (ref 11.5–15.5)
WBC: 21.4 10*3/uL — ABNORMAL HIGH (ref 4.0–10.5)
nRBC: 0.1 % (ref 0.0–0.2)

## 2020-02-07 LAB — HEPARIN LEVEL (UNFRACTIONATED)
Heparin Unfractionated: <0.1 [IU]/mL — ABNORMAL LOW (ref 0.30–0.70)
Heparin Unfractionated: <0.1 IU/mL — ABNORMAL LOW (ref 0.30–0.70)
Heparin Unfractionated: 0.97 [IU]/mL — ABNORMAL HIGH (ref 0.30–0.70)
Heparin Unfractionated: <0.1 IU/mL — ABNORMAL LOW (ref 0.30–0.70)

## 2020-02-07 MED ORDER — METOPROLOL TARTRATE 25 MG/10 ML ORAL SUSPENSION
50.0000 mg | Freq: Two times a day (BID) | ORAL | Status: DC
  Administered 2020-02-07 – 2020-02-08 (×4): 50 mg
  Filled 2020-02-07 (×4): qty 20

## 2020-02-07 MED ORDER — ATORVASTATIN CALCIUM 40 MG PO TABS
40.0000 mg | ORAL_TABLET | Freq: Every day | ORAL | Status: DC
  Administered 2020-02-07 – 2020-02-08 (×2): 40 mg via ORAL
  Filled 2020-02-07 (×2): qty 1

## 2020-02-07 MED ORDER — ENSURE ENLIVE PO LIQD
237.0000 mL | Freq: Three times a day (TID) | ORAL | Status: DC
  Administered 2020-02-07 – 2020-02-08 (×3): 237 mL via ORAL

## 2020-02-07 MED ORDER — HYDRALAZINE HCL 20 MG/ML IJ SOLN
10.0000 mg | Freq: Four times a day (QID) | INTRAMUSCULAR | Status: DC | PRN
  Administered 2020-02-09 – 2020-02-14 (×6): 10 mg via INTRAVENOUS
  Filled 2020-02-07 (×7): qty 1

## 2020-02-07 MED ORDER — DOCUSATE SODIUM 50 MG/5ML PO LIQD: 100.0000 mg | Freq: Two times a day (BID) | ORAL | Status: DC

## 2020-02-07 MED ORDER — METOPROLOL TARTRATE 5 MG/5ML IV SOLN
5.0000 mg | Freq: Four times a day (QID) | INTRAVENOUS | Status: DC | PRN
  Administered 2020-02-08 – 2020-02-15 (×9): 5 mg via INTRAVENOUS
  Filled 2020-02-07 (×12): qty 5

## 2020-02-07 MED ORDER — DEXMEDETOMIDINE HCL IN NACL 400 MCG/100ML IV SOLN
0.0000 ug/kg/h | INTRAVENOUS | Status: DC
  Administered 2020-02-07: 0.6 ug/kg/h via INTRAVENOUS
  Filled 2020-02-07 (×4): qty 100

## 2020-02-07 MED ORDER — ASPIRIN EC 81 MG PO TBEC
81.0000 mg | DELAYED_RELEASE_TABLET | Freq: Every day | ORAL | Status: DC
  Administered 2020-02-07 – 2020-02-15 (×9): 81 mg via ORAL
  Filled 2020-02-07 (×9): qty 1

## 2020-02-07 MED ORDER — OLANZAPINE 5 MG PO TBDP
10.0000 mg | ORAL_TABLET | Freq: Every day | ORAL | Status: DC
  Administered 2020-02-07 – 2020-02-15 (×9): 10 mg via ORAL
  Filled 2020-02-07 (×9): qty 2

## 2020-02-07 MED ORDER — CHLORDIAZEPOXIDE HCL 25 MG PO CAPS
25.0000 mg | ORAL_CAPSULE | Freq: Three times a day (TID) | ORAL | Status: DC
  Administered 2020-02-07 – 2020-02-08 (×6): 25 mg via ORAL
  Filled 2020-02-07 (×6): qty 1

## 2020-02-07 MED ORDER — POLYETHYLENE GLYCOL 3350 17 G PO PACK: 17.0000 g | PACK | Freq: Every day | ORAL | Status: DC
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
## 2020-02-07 NOTE — Progress Notes (Signed)
IP rehab admissions - following along from a distance.  Not yet ready for inpatient rehab admission.  Call for questions.  [phone]
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
## 2020-02-07 NOTE — Progress Notes (Signed)
ANTICOAGULATION CONSULT NOTE   Pharmacy Consult for IV heparin Indication: UE DVT, stroke  No Known Allergies  Patient Measurements: Height: 5\' 7"  (170.2 cm) Weight: 97.1 kg (214 lb 1.1 oz) (Bed scale) IBW/kg (Calculated) : 66.1 Heparin Dosing Weight: 77 kg  Vital Signs: Temp: 99.7 F (37.6 C) (10/26 0800) Temp Source: Axillary (10/26 0800) BP: 156/82 (10/26 0700) Pulse Rate: 112 (10/26 0900)  Labs: Recent Labs    02/05/20 0530 02/05/20 0530 02/06/20 0618 02/06/20 0618 02/06/20 1009 02/06/20 2000 02/06/20 2011 02/07/20 0100 02/07/20 0500  HGB 7.8*   < > 7.2*   < >  --  7.7*  --   --  7.5*  HCT 24.3*   < > 23.2*  --   --  24.3*  --   --  23.9*  PLT 450*   < > 482*  --   --  624*  --   --  616*  HEPARINUNFRC  --   --   --   --   --   --  <0.10* <0.10*  --   CREATININE 1.28*  --   --   --  1.26*  --   --   --  1.16   < > = values in this interval not displayed.    Estimated Creatinine Clearance: 106.2 mL/min (by C-G formula based on SCr of 1.16 mg/dL).   Medical History: History reviewed. No pertinent past medical history.  Medications:  Infusions:  . sodium chloride Stopped (02/01/20 1518)  . dexmedetomidine (PRECEDEX) IV infusion 0.3 mcg/kg/hr (02/07/20 0900)  . dextrose 5 % and 0.45% NaCl Stopped (02/06/20 1709)  . feeding supplement (VITAL 1.5 CAL) 60 mL/hr at 02/07/20 0000  . heparin 950 Units/hr (02/07/20 0900)    Assessment: 29 yo male with new stroke, UE DVT and anemia.  Pharmacy asked to cautiously start IV heparin.     Heparin level this afternoon remains SUBtherapeutic (HL<0.1, goal of 0.3-0.5). No bleeding or issues with the drip reported per RN.  Goal of Therapy:  Heparin level 0.3-0.5 stroke dosing Monitor platelets by anticoagulation protocol: Yes   Plan:  - Increase Heparin to 1100 units/hr - Will continue to monitor for any signs/symptoms of bleeding and will follow up with heparin level in 6 hours   Thank you for allowing pharmacy to  be a part of this patient's care.  34, PharmD, BCPS Clinical Pharmacist Clinical phone for 02/07/2020: 02/09/2020 02/07/2020 11:50 AM   **Pharmacist phone directory can now be found on [email or web address] ([email or web address]).  Listed under [facility].

## 2020-02-07 NOTE — Progress Notes (Signed)
Earlier in the night patient remove cortrak. Now he has become increasingly agitated with high hypertension SBP > 200 and tachycardia HR > 150 despite 4 mg ativan dose. Will restart precedex to deal with agitation
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
## 2020-02-07 NOTE — Progress Notes (Signed)
ANTICOAGULATION CONSULT NOTE   Pharmacy Consult for IV heparin Indication: UE DVT, stroke  No Known Allergies  Patient Measurements: Height: 5\' 7"  (170.2 cm) Weight: 97.1 kg (214 lb 1.1 oz) (Bed scale) IBW/kg (Calculated) : 66.1 Heparin Dosing Weight: 77 kg  Vital Signs: Temp: 100.9 F (38.3 C) (10/26 0000) Temp Source: Axillary (10/26 0000) BP: 223/121 (10/26 0100) Pulse Rate: 133 (10/26 0100)  Labs: Recent Labs    02/04/20 0500 02/04/20 0500 02/05/20 0530 02/05/20 0530 02/06/20 0618 02/06/20 1009 02/06/20 2000 02/06/20 2011 02/07/20 0100  HGB 7.5*   < > 7.8*   < > 7.2*  --  7.7*  --   --   HCT 23.2*   < > 24.3*  --  23.2*  --  24.3*  --   --   PLT 328   < > 450*  --  482*  --  624*  --   --   HEPARINUNFRC  --   --   --   --   --   --   --  <0.10* <0.10*  CREATININE 1.46*  --  1.28*  --   --  1.26*  --   --   --    < > = values in this interval not displayed.    Estimated Creatinine Clearance: 97.8 mL/min (A) (by C-G formula based on SCr of 1.26 mg/dL (H)).   Medical History: History reviewed. No pertinent past medical history.  Medications:  Infusions:  . sodium chloride Stopped (02/01/20 1518)  . dexmedetomidine (PRECEDEX) IV infusion    . dextrose 5 % and 0.45% NaCl Stopped (02/06/20 1709)  . feeding supplement (VITAL 1.5 CAL) 60 mL/hr at 02/07/20 0000  . heparin 750 Units/hr (02/07/20 0000)    Assessment: 29 yo male with new stroke, UE DVT and anemia.  Pharmacy asked to cautiously start IV heparin.     10/26 AM update:  Heparin level undetectable No issues per RN  Goal of Therapy:  Heparin level 0.3-0.5 stroke dosing Monitor platelets by anticoagulation protocol: Yes   Plan:  Inc heparin to 950 units/hr Re-check heparin level in 6-8 hours  11/26, PharmD, BCPS Clinical Pharmacist Phone: [phone]

## 2020-02-07 NOTE — Progress Notes (Addendum)
ANTICOAGULATION CONSULT NOTE   Pharmacy Consult for IV heparin Indication: UE DVT, stroke  No Known Allergies  Patient Measurements: Height: 5\' 7"  (170.2 cm) Weight: 97.1 kg (214 lb 1.1 oz) (Bed scale) IBW/kg (Calculated) : 66.1 Heparin Dosing Weight: 77 kg  Vital Signs: Temp: 100.5 F (38.1 C) (10/26 2000) Temp Source: Oral (10/26 2000) BP: 210/97 (10/26 2000) Pulse Rate: 108 (10/26 2000)  Labs: Recent Labs    02/05/20 0530 02/05/20 0530 02/06/20 0618 02/06/20 0618 02/06/20 1009 02/06/20 2000 02/06/20 2011 02/07/20 0100 02/07/20 0500 02/07/20 1000 02/07/20 1900  HGB 7.8*   < > 7.2*   < >  --  7.7*  --   --  7.5*  --   --   HCT 24.3*   < > 23.2*  --   --  24.3*  --   --  23.9*  --   --   PLT 450*   < > 482*  --   --  624*  --   --  616*  --   --   HEPARINUNFRC  --   --   --   --   --   --    < > <0.10*  --  <0.10* <0.10*  0.97*  CREATININE 1.28*  --   --   --  1.26*  --   --   --  1.16  --   --    < > = values in this interval not displayed.    Estimated Creatinine Clearance: 106.2 mL/min (by C-G formula based on SCr of 1.16 mg/dL).   Medical History: History reviewed. No pertinent past medical history.  Medications:  Infusions:  . sodium chloride Stopped (02/01/20 1518)  . dexmedetomidine (PRECEDEX) IV infusion 0.5 mcg/kg/hr (02/07/20 2000)  . dextrose 5 % and 0.45% NaCl Stopped (02/06/20 1709)  . heparin 1,100 Units/hr (02/07/20 2009)    Assessment: 29 yo male with new stroke, UE DVT and anemia.  Pharmacy asked to cautiously start IV heparin.     Heparin level remains subtherapeutic on redraw, appears to have been drawn appropriately. Infusion sites changed earlier in shift per nursing.  Goal of Therapy:  Heparin level 0.3-0.5 stroke dosing Monitor platelets by anticoagulation protocol: Yes   Plan:  -Increase heparin to 1350 units/h -Recheck heparin level in 6h   34, PharmD, BCPS Clinical Pharmacist [phone] Please check AMION  for all [facility] Pharmacy numbers 02/07/2020
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
## 2020-02-08 ENCOUNTER — Inpatient Hospital Stay (HOSPITAL_COMMUNITY): Payer: No Typology Code available for payment source

## 2020-02-08 LAB — BASIC METABOLIC PANEL
Anion gap: 10 (ref 5–15)
BUN: 28 mg/dL — ABNORMAL HIGH (ref 6–20)
CO2: 18 mmol/L — ABNORMAL LOW (ref 22–32)
Calcium: 8.7 mg/dL — ABNORMAL LOW (ref 8.9–10.3)
Chloride: 116 mmol/L — ABNORMAL HIGH (ref 98–111)
Creatinine, Ser: 1.2 mg/dL (ref 0.61–1.24)
GFR, Estimated: >60 mL/min (ref >60)
Glucose, Bld: 103 mg/dL — ABNORMAL HIGH (ref 70–99)
Potassium: 4.4 mmol/L (ref 3.5–5.1)
Sodium: 144 mmol/L (ref 135–145)

## 2020-02-08 LAB — CBC
HCT: 23.3 % — ABNORMAL LOW (ref 39.0–52.0)
Hemoglobin: 7.3 g/dL — ABNORMAL LOW (ref 13.0–17.0)
MCH: 27.8 pg (ref 26.0–34.0)
MCHC: 31.3 g/dL (ref 30.0–36.0)
MCV: 88.6 fL (ref 80.0–100.0)
Platelets: 663 10*3/uL — ABNORMAL HIGH (ref 150–400)
RBC: 2.63 MIL/uL — ABNORMAL LOW (ref 4.22–5.81)
RDW: 14.9 % (ref 11.5–15.5)
WBC: 18.9 10*3/uL — ABNORMAL HIGH (ref 4.0–10.5)
nRBC: 0 % (ref 0.0–0.2)

## 2020-02-08 LAB — GLUCOSE, CAPILLARY
Glucose-Capillary: 104 mg/dL — ABNORMAL HIGH (ref 70–99)
Glucose-Capillary: 121 mg/dL — ABNORMAL HIGH (ref 70–99)
Glucose-Capillary: 124 mg/dL — ABNORMAL HIGH (ref 70–99)
Glucose-Capillary: 137 mg/dL — ABNORMAL HIGH (ref 70–99)
Glucose-Capillary: 87 mg/dL (ref 70–99)
Glucose-Capillary: 98 mg/dL (ref 70–99)

## 2020-02-08 LAB — HEPARIN LEVEL (UNFRACTIONATED)
Heparin Unfractionated: <0.1 IU/mL — ABNORMAL LOW (ref 0.30–0.70)
Heparin Unfractionated: 0.13 [IU]/mL — ABNORMAL LOW (ref 0.30–0.70)
Heparin Unfractionated: 0.32 [IU]/mL (ref 0.30–0.70)
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
## 2020-02-08 IMAGING — DX DG CHEST 1V PORT
1 series · 1 of 1 positions shown · non-contrast
Comparison: [DATE] chest radiograph.

CLINICAL DATA: Hypoxia

EXAM:
PORTABLE CHEST 1 VIEW

[chest]
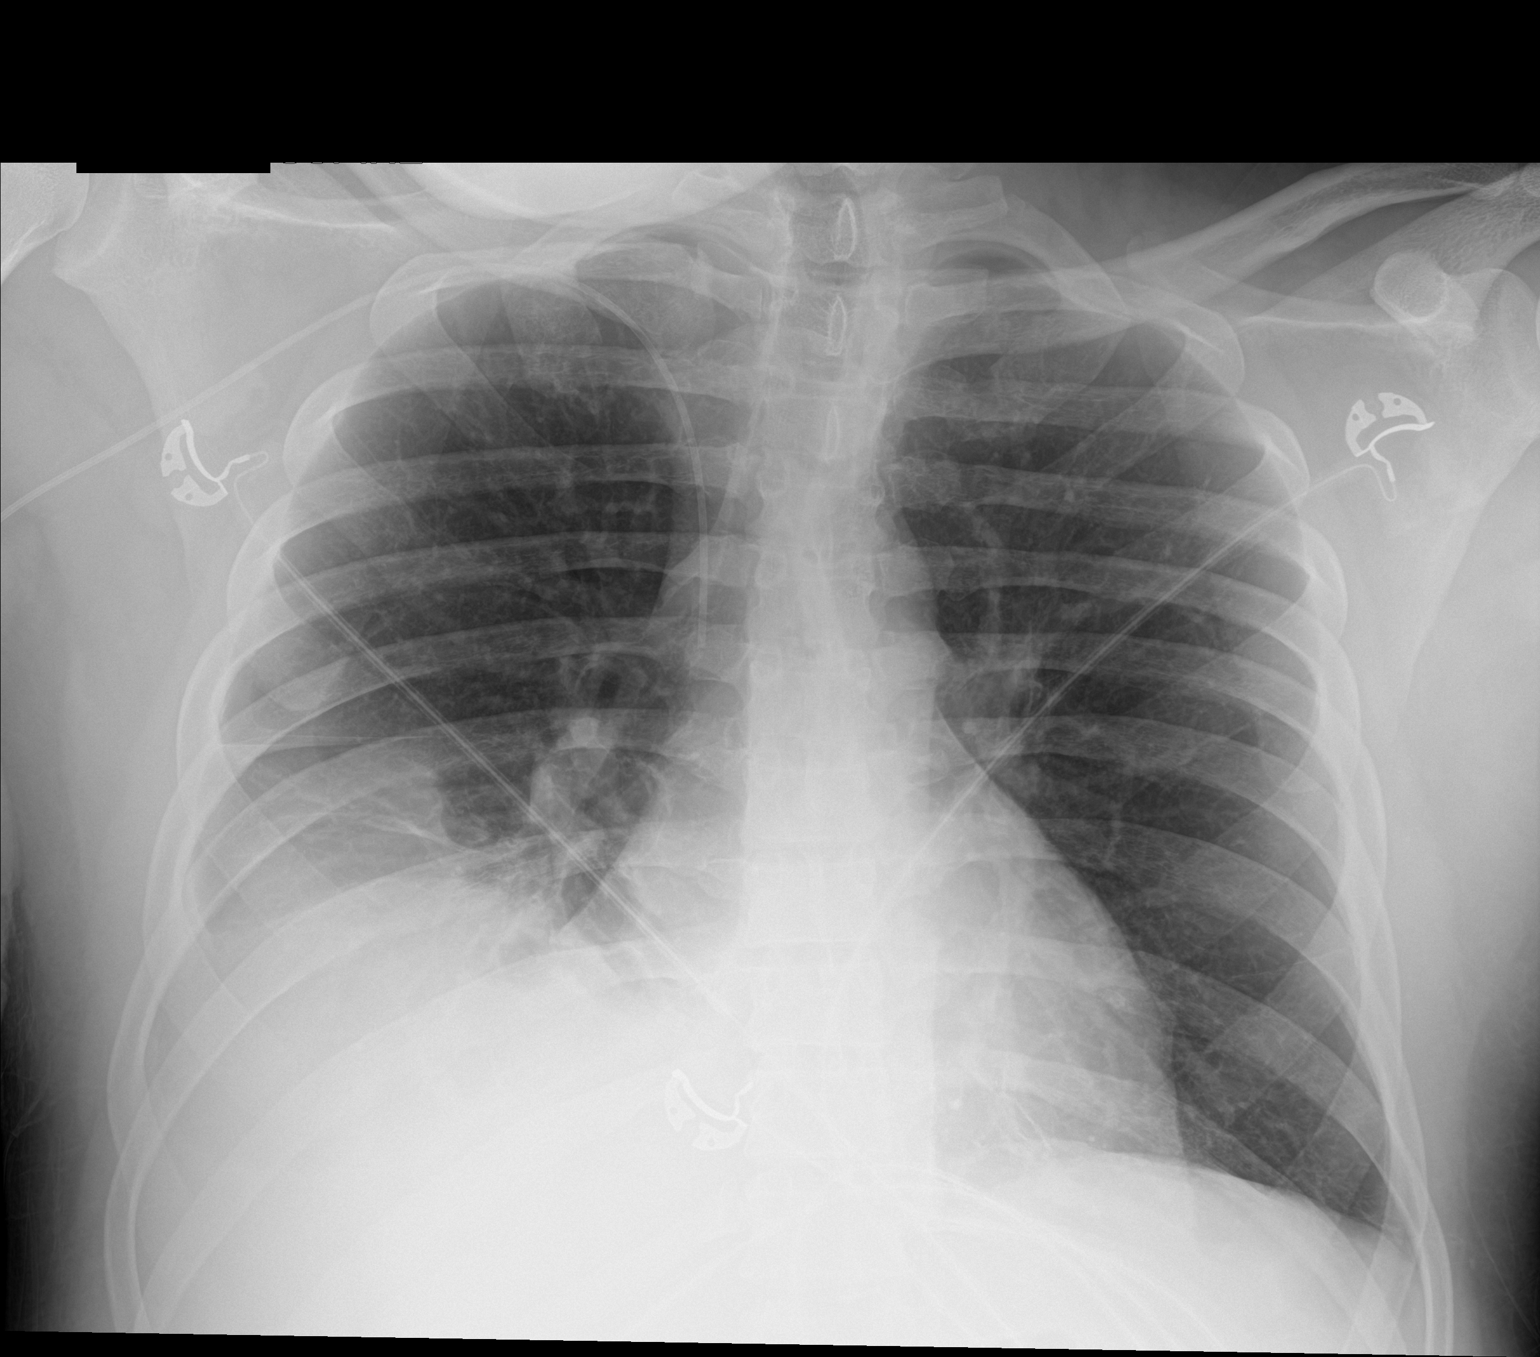

[1 of 1 positions shown; findings below may reference images not displayed]

FINDINGS: Right PICC terminates in the middle third of the SVC. Stable
cardiomediastinal silhouette with normal heart size. No
pneumothorax. Small right pleural effusion is stable. No left
pleural effusion. Patchy right lung base opacity is unchanged. No
pulmonary edema.
IMPRESSION: 1. Stable small right pleural effusion.
2. Stable patchy right lung base opacity, either atelectasis,
aspiration or pneumonia.

## 2020-02-08 MED ORDER — ADULT MULTIVITAMIN W/MINERALS CH
1.0000 | ORAL_TABLET | Freq: Every day | ORAL | Status: DC
  Administered 2020-02-09 – 2020-02-15 (×7): 1 via ORAL
  Filled 2020-02-08 (×7): qty 1

## 2020-02-08 MED ORDER — VITAMIN D (ERGOCALCIFEROL) 1.25 MG (50000 UNIT) PO CAPS
50000.0000 [IU] | ORAL_CAPSULE | ORAL | Status: DC
  Administered 2020-02-10: 50000 [IU] via ORAL
  Filled 2020-02-08: qty 1

## 2020-02-08 MED ORDER — DOCUSATE SODIUM 100 MG PO CAPS
100.0000 mg | ORAL_CAPSULE | Freq: Two times a day (BID) | ORAL | Status: DC
  Administered 2020-02-09 – 2020-02-15 (×13): 100 mg via ORAL
  Filled 2020-02-08 (×13): qty 1

## 2020-02-08 MED ORDER — METOPROLOL TARTRATE 50 MG PO TABS
50.0000 mg | ORAL_TABLET | Freq: Two times a day (BID) | ORAL | Status: DC
  Administered 2020-02-09 – 2020-02-13 (×9): 50 mg via ORAL
  Filled 2020-02-08 (×9): qty 1

## 2020-02-08 MED ORDER — ATORVASTATIN CALCIUM 40 MG PO TABS: 40.0000 mg | ORAL_TABLET | Freq: Every day | ORAL | Status: DC

## 2020-02-08 MED ORDER — LACTULOSE 10 GM/15ML PO SOLN
20.0000 g | Freq: Every day | ORAL | Status: DC
  Administered 2020-02-09 – 2020-02-10 (×2): 20 g via ORAL
  Filled 2020-02-08 (×2): qty 30

## 2020-02-08 MED ORDER — BETHANECHOL CHLORIDE 10 MG PO TABS: 25.0000 mg | ORAL_TABLET | Freq: Three times a day (TID) | ORAL | Status: DC

## 2020-02-08 MED ORDER — ENSURE ENLIVE PO LIQD: 237.0000 mL | Freq: Three times a day (TID) | ORAL | Status: DC

## 2020-02-08 MED ORDER — CHLORDIAZEPOXIDE HCL 25 MG PO CAPS: 25.0000 mg | ORAL_CAPSULE | Freq: Three times a day (TID) | ORAL | Status: DC

## 2020-02-08 MED ORDER — ENSURE ENLIVE PO LIQD
237.0000 mL | Freq: Three times a day (TID) | ORAL | Status: DC
  Administered 2020-02-09 – 2020-02-15 (×21): 237 mL via ORAL

## 2020-02-08 MED ORDER — FOLIC ACID 1 MG PO TABS
1.0000 mg | ORAL_TABLET | Freq: Every day | ORAL | Status: DC
  Administered 2020-02-09 – 2020-02-15 (×7): 1 mg via ORAL
  Filled 2020-02-08 (×7): qty 1

## 2020-02-08 MED ORDER — LORAZEPAM 1 MG PO TABS: 1.0000 mg | ORAL_TABLET | ORAL | Status: DC | PRN

## 2020-02-08 MED ORDER — THIAMINE HCL 100 MG PO TABS
100.0000 mg | ORAL_TABLET | Freq: Every day | ORAL | Status: DC
  Administered 2020-02-09 – 2020-02-15 (×7): 100 mg via ORAL
  Filled 2020-02-08 (×7): qty 1

## 2020-02-08 MED ORDER — BETHANECHOL CHLORIDE 25 MG PO TABS
25.0000 mg | ORAL_TABLET | Freq: Three times a day (TID) | ORAL | Status: DC
  Administered 2020-02-09 – 2020-02-15 (×18): 25 mg via ORAL
  Filled 2020-02-08 (×13): qty 1
  Filled 2020-02-08: qty 3
  Filled 2020-02-08 (×6): qty 1

## 2020-02-08 MED ORDER — ACETAMINOPHEN 325 MG PO TABS
650.0000 mg | ORAL_TABLET | Freq: Four times a day (QID) | ORAL | Status: DC | PRN
  Administered 2020-02-08 – 2020-02-14 (×12): 650 mg via ORAL
  Filled 2020-02-08 (×12): qty 2

## 2020-02-08 MED ORDER — LORAZEPAM 2 MG/ML IJ SOLN: 1.0000 mg | INTRAMUSCULAR | Status: AC | PRN

## 2020-02-08 MED ORDER — LORAZEPAM 2 MG/ML IJ SOLN: 1.0000 mg | INTRAMUSCULAR | Status: DC | PRN

## 2020-02-08 MED ORDER — LORAZEPAM 1 MG PO TABS: 1.0000 mg | ORAL_TABLET | ORAL | Status: AC | PRN

## 2020-02-08 MED ORDER — LIDOCAINE HCL 1 % IJ SOLN
INTRAMUSCULAR | Status: AC
  Filled 2020-02-08: qty 20

## 2020-02-08 MED ORDER — CHLORDIAZEPOXIDE HCL 25 MG PO CAPS
25.0000 mg | ORAL_CAPSULE | Freq: Three times a day (TID) | ORAL | Status: DC
  Administered 2020-02-09 – 2020-02-15 (×18): 25 mg via ORAL
  Filled 2020-02-08 (×20): qty 1

## 2020-02-08 MED ORDER — LIDOCAINE HCL (PF) 1 % IJ SOLN
INTRAMUSCULAR | Status: AC | PRN
  Administered 2020-02-08: 10 mL

## 2020-02-08 MED ORDER — OXYCODONE HCL 5 MG PO TABS
5.0000 mg | ORAL_TABLET | ORAL | Status: DC | PRN
  Administered 2020-02-09 – 2020-02-10 (×2): 5 mg via ORAL
  Administered 2020-02-11: 10 mg via ORAL
  Administered 2020-02-12: 5 mg via ORAL
  Administered 2020-02-12 – 2020-02-14 (×4): 10 mg via ORAL
  Administered 2020-02-14: 5 mg via ORAL
  Administered 2020-02-15 (×2): 10 mg via ORAL
  Filled 2020-02-08 (×2): qty 2
  Filled 2020-02-08 (×2): qty 1
  Filled 2020-02-08 (×3): qty 2
  Filled 2020-02-08: qty 1
  Filled 2020-02-08 (×2): qty 2
  Filled 2020-02-08: qty 1

## 2020-02-08 MED ORDER — SIMETHICONE 80 MG PO CHEW: 80.0000 mg | CHEWABLE_TABLET | Freq: Four times a day (QID) | ORAL | Status: DC | PRN

## 2020-02-08 MED ORDER — VITAMIN D 25 MCG (1000 UNIT) PO TABS
2000.0000 [IU] | ORAL_TABLET | Freq: Two times a day (BID) | ORAL | Status: DC
  Administered 2020-02-09 – 2020-02-15 (×13): 2000 [IU] via ORAL
  Filled 2020-02-08 (×13): qty 2

## 2020-02-08 NOTE — Procedures (Signed)
Interventional Radiology Procedure Note  Procedure: PICC placement  Findings: Please refer to procedural dictation for full description. Right basilic vein, 5 Fr, dual lumen, 37 cm.  Tip in SVC.  Complications: None immediate  Estimated Blood Loss: < 5 mL  Recommendations: PICC ready for immediate use.   Marliss Coots, MD Pager: [phone]

## 2020-02-08 NOTE — Progress Notes (Signed)
I was called for tachycardia and new oxygen requirement. Patient examined at bedside. Alert but confused. O2 sats 96%, nasal cannula not in nose at the time. HR in 120s and regular. CXR shows opacity in the RLL, but this was present on CT a few days ago. Precedex stopped this morning. Continue librium, prn ativan. If persistent tachycardia and hypertension in spite of prns may need to resume precedex.

## 2020-02-08 NOTE — Progress Notes (Signed)
ANTICOAGULATION CONSULT NOTE   Pharmacy Consult for IV heparin Indication: UE DVT, stroke  No Known Allergies  Patient Measurements: Height: 5\' 7"  (170.2 cm) Weight: 93.2 kg (205 lb 7.5 oz) IBW/kg (Calculated) : 66.1 Heparin Dosing Weight: 77 kg  Vital Signs: Temp: 101 F (38.3 C) (10/27 2000) Temp Source: Oral (10/27 2000) BP: 197/99 (10/27 2100) Pulse Rate: 112 (10/27 2100)  Labs: Recent Labs    02/06/20 0618 02/06/20 1009 02/06/20 2000 02/06/20 2011 02/07/20 0500 02/07/20 1000 02/08/20 0550 02/08/20 1318 02/08/20 2030  HGB   < >  --  7.7*   < > 7.5*  --  7.3*  --   --   HCT   < >  --  24.3*  --  23.9*  --  23.3*  --   --   PLT   < >  --  624*  --  616*  --  663*  --   --   HEPARINUNFRC  --   --   --    < >  --    < > <0.10* 0.13* 0.32  CREATININE  --  1.26*  --   --  1.16  --  1.20  --   --    < > = values in this interval not displayed.    Estimated Creatinine Clearance: 100.6 mL/min (by C-G formula based on SCr of 1.2 mg/dL).   Medical History: History reviewed. No pertinent past medical history.  Medications:  Infusions:  . sodium chloride Stopped (02/01/20 1518)  . dextrose 5 % and 0.45% NaCl Stopped (02/06/20 1709)  . heparin 1,900 Units/hr (02/08/20 1430)    Assessment: 29 yo male with new stroke, UE DVT and anemia.  Pharmacy asked to cautiously start IV heparin.     Heparin level this afternoon remains SUBherapeutic after a rate increase earlier today however is detectable (HL 0.13, goal of 0.3-0.5). It is noted that the drip was transitioned from the peripheral IV site to the PICC site overnight to ensure infusing appropriately. In addition, the PICC site was replaced this morning and the drip was transitioned over to the new line.  No bleeding or issues noted per RN and it does not appear to have been stopped for the PICC exchange.   PM follow up - heparin level now at goal.  No overt bleeding or complications noted.  Goal of Therapy:  Heparin  level 0.3-0.5 stroke dosing Monitor platelets by anticoagulation protocol: Yes   Plan:  Continue IV Heparin at 1900 units/hr (19 ml/hr) -Will continue to monitor for any signs/symptoms of bleeding -Daily heparin level and CBC  Thank you for allowing pharmacy to be a part of this patient's care.  34, Reece Leader, BCCP Clinical Pharmacist  02/08/2020 10:01 PM   [facility] pharmacy phone numbers are listed on [email or web address]
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
## 2020-02-08 NOTE — Progress Notes (Signed)
Left PICC removed intact per policy.  Vaseline gauze and folded 4x4 gauze pressure dressing applied with manual pressure held x 5 minutes.  No signs, symptoms bleeding post removal.  TK, RN aware to monitor site.

## 2020-02-08 NOTE — Progress Notes (Signed)
SLP Cancellation Note  Patient Details Name: Christopher Marks MRN: [id number] DOB: Dec 13, 1990   Cancelled treatment:       Reason Eval/Treat Not Completed: Patient at procedure or test/unavailable. Will f/u as able.   Mahala Menghini., M.A. CCC-SLP Acute Rehabilitation Services Pager [phone] Office [phone]  02/08/2020, 9:43 AM

## 2020-02-08 NOTE — Progress Notes (Signed)
ANTICOAGULATION CONSULT NOTE   Pharmacy Consult for IV heparin Indication: UE DVT, stroke  No Known Allergies  Patient Measurements: Height: 5\' 7"  (170.2 cm) Weight: 93.2 kg (205 lb 7.5 oz) IBW/kg (Calculated) : 66.1 Heparin Dosing Weight: 77 kg  Vital Signs: Temp: 98.5 F (36.9 C) (10/27 0800) Temp Source: Oral (10/27 0800) BP: 196/136 (10/27 0800) Pulse Rate: 95 (10/27 0800)  Labs: Recent Labs    02/06/20 0618 02/06/20 1009 02/06/20 2000 02/06/20 2011 02/07/20 0100 02/07/20 0500 02/07/20 1000 02/07/20 1900 02/08/20 0550  HGB   < >  --  7.7*   < >  --  7.5*  --   --  7.3*  HCT   < >  --  24.3*  --   --  23.9*  --   --  23.3*  PLT   < >  --  624*  --   --  616*  --   --  663*  HEPARINUNFRC  --   --   --    < >   < >  --  <0.10* <0.10*  0.97* <0.10*  CREATININE  --  1.26*  --   --   --  1.16  --   --  1.20   < > = values in this interval not displayed.    Estimated Creatinine Clearance: 100.6 mL/min (by C-G formula based on SCr of 1.2 mg/dL).   Medical History: History reviewed. No pertinent past medical history.  Medications:  Infusions:  . sodium chloride Stopped (02/01/20 1518)  . dextrose 5 % and 0.45% NaCl Stopped (02/06/20 1709)  . heparin 1,650 Units/hr (02/08/20 1000)    Assessment: 29 yo male with new stroke, UE DVT and anemia.  Pharmacy asked to cautiously start IV heparin.     Heparin level this afternoon remains SUBherapeutic after a rate increase earlier today however is detectable (HL 0.13, goal of 0.3-0.5). It is noted that the drip was transitioned from the peripheral IV site to the PICC site overnight to ensure infusing appropriately. In addition, the PICC site was replaced this morning and the drip was transitioned over to the new line.  No bleeding or issues noted per RN and it does not appear to have been stopped for the PICC exchange.   Goal of Therapy:  Heparin level 0.3-0.5 stroke dosing Monitor platelets by anticoagulation protocol:  Yes   Plan:  - Increase Heparin to 1900 units/hr (19 ml/hr) - Will continue to monitor for any signs/symptoms of bleeding and will follow up with heparin level in 6 hours   Thank you for allowing pharmacy to be a part of this patient's care.  34, PharmD, BCPS Clinical Pharmacist Clinical phone for 02/08/2020: 02/10/2020 02/08/2020 10:23 AM   **Pharmacist phone directory can now be found on [email or web address] ([email or web address]).  Listed under [facility].
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
## 2020-02-09 ENCOUNTER — Inpatient Hospital Stay (HOSPITAL_COMMUNITY): Payer: No Typology Code available for payment source

## 2020-02-09 LAB — URINALYSIS, ROUTINE W REFLEX MICROSCOPIC
Bilirubin Urine: NEGATIVE
Glucose, UA: NEGATIVE mg/dL
Hgb urine dipstick: NEGATIVE
Ketones, ur: NEGATIVE mg/dL
Leukocytes,Ua: NEGATIVE
Nitrite: NEGATIVE
Protein, ur: NEGATIVE mg/dL
Specific Gravity, Urine: 1.01 (ref 1.005–1.030)
pH: 7 (ref 5.0–8.0)

## 2020-02-09 LAB — CBC
HCT: 23.3 % — ABNORMAL LOW (ref 39.0–52.0)
Hemoglobin: 7.1 g/dL — ABNORMAL LOW (ref 13.0–17.0)
MCH: 27.5 pg (ref 26.0–34.0)
MCHC: 30.5 g/dL (ref 30.0–36.0)
MCV: 90.3 fL (ref 80.0–100.0)
Platelets: 680 10*3/uL — ABNORMAL HIGH (ref 150–400)
RBC: 2.58 MIL/uL — ABNORMAL LOW (ref 4.22–5.81)
RDW: 14.7 % (ref 11.5–15.5)
WBC: 18.3 10*3/uL — ABNORMAL HIGH (ref 4.0–10.5)
nRBC: 0 % (ref 0.0–0.2)

## 2020-02-09 LAB — BASIC METABOLIC PANEL
Anion gap: 9 (ref 5–15)
BUN: 18 mg/dL (ref 6–20)
CO2: 18 mmol/L — ABNORMAL LOW (ref 22–32)
Calcium: 8.5 mg/dL — ABNORMAL LOW (ref 8.9–10.3)
Chloride: 112 mmol/L — ABNORMAL HIGH (ref 98–111)
Creatinine, Ser: 1.12 mg/dL (ref 0.61–1.24)
GFR, Estimated: >60 mL/min (ref >60)
Glucose, Bld: 108 mg/dL — ABNORMAL HIGH (ref 70–99)
Potassium: 4.1 mmol/L (ref 3.5–5.1)
Sodium: 139 mmol/L (ref 135–145)

## 2020-02-09 LAB — GLUCOSE, CAPILLARY
Glucose-Capillary: 102 mg/dL — ABNORMAL HIGH (ref 70–99)
Glucose-Capillary: 90 mg/dL (ref 70–99)
Glucose-Capillary: 129 mg/dL — ABNORMAL HIGH (ref 70–99)
Glucose-Capillary: 90 mg/dL (ref 70–99)
Glucose-Capillary: 86 mg/dL (ref 70–99)

## 2020-02-09 LAB — HEPARIN LEVEL (UNFRACTIONATED)
Heparin Unfractionated: 0.24 IU/mL — ABNORMAL LOW (ref 0.30–0.70)
Heparin Unfractionated: 0.56 [IU]/mL (ref 0.30–0.70)

## 2020-02-09 LAB — MAGNESIUM: Magnesium: 2.1 mg/dL (ref 1.7–2.4)

## 2020-02-09 LAB — PHOSPHORUS: Phosphorus: 3.6 mg/dL (ref 2.5–4.6)

## 2020-02-09 IMAGING — CT CT HEAD W/O CM
4 of 5 series · 15 of 47 positions shown, 17 images · non-contrast
Comparison: [DATE]

CLINICAL DATA: Stroke, follow-up

EXAM:
CT HEAD WITHOUT CONTRAST
TECHNIQUE: Contiguous axial images were obtained from the base of the skull
through the vertex without intravenous contrast.

[Series 3: head without · axial · non-contrast · 0.45mm/px · z∈[+1167,+1202]mm · 2 of 37 slices shown]
[im 8/37  brain]
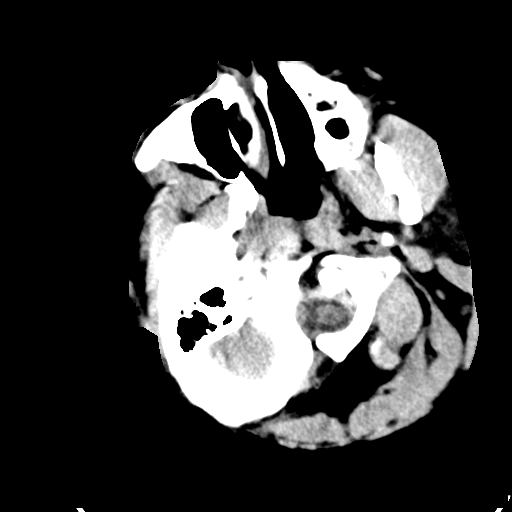
[im 15/37  brain]
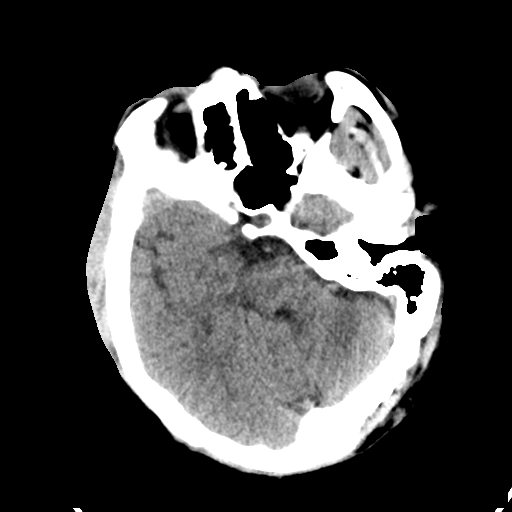

[Series 5: head without cor · coronal · non-contrast · 0.34mm/px · 3 of 72 slices shown]
[im 24/72  brain]
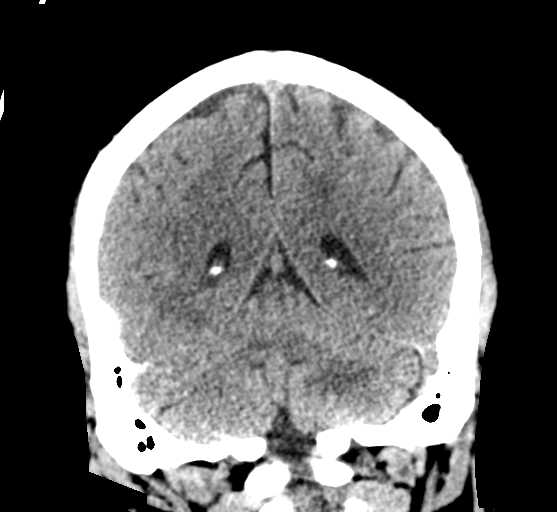
[im 32/72  brain]
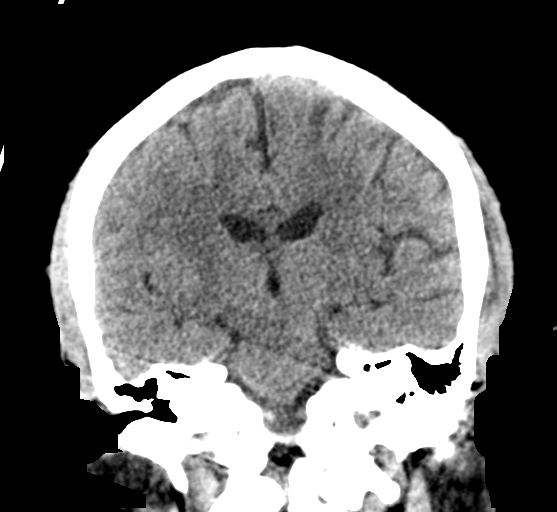
[im 40/72  brain]
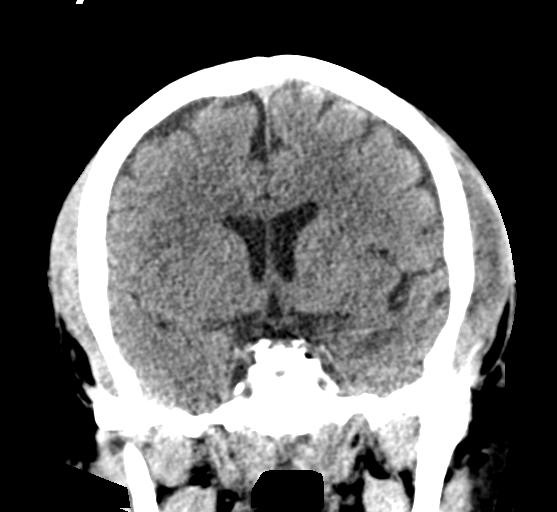

[Series 6: head without sag · sagittal · non-contrast · 0.33mm/px · 3 of 59 slices shown]
[im 27/59  brain]
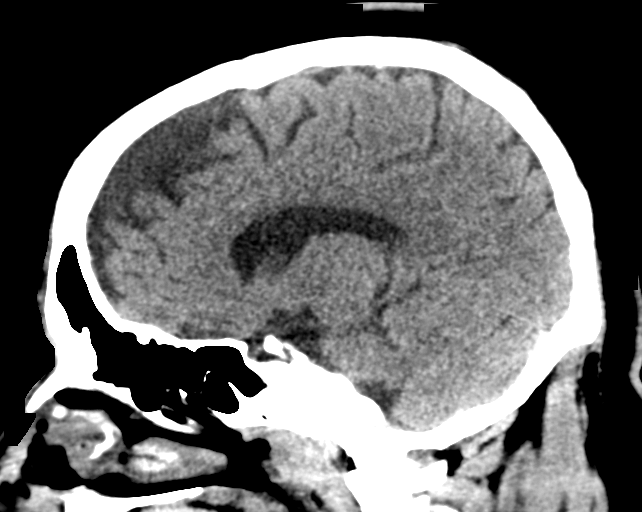
[im 30/59  brain]
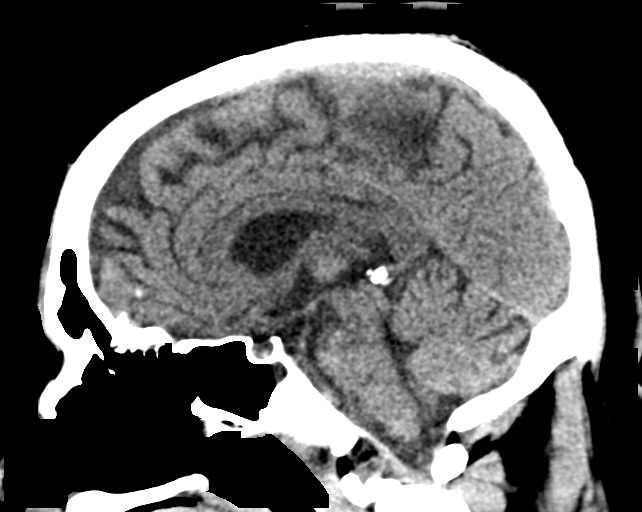
[im 32/59  brain]
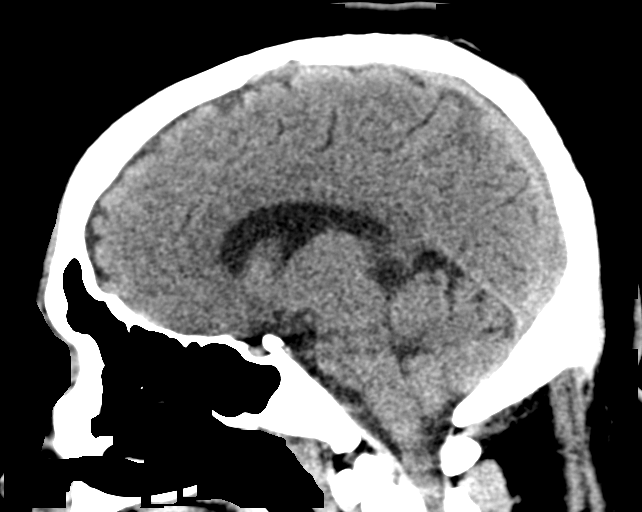

[Series 7: head without ax · axial · non-contrast · 0.37mm/px · z∈[+1151,+1266]mm · 7 of 59 slices shown, 9 images]
[im 8/59  brain]
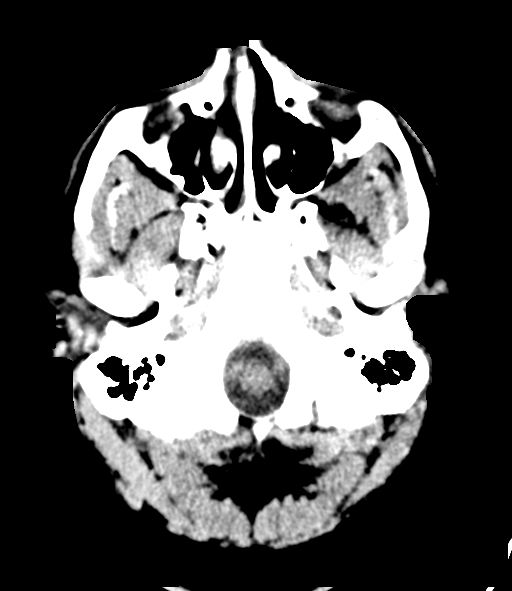
[im 8/59  bone]
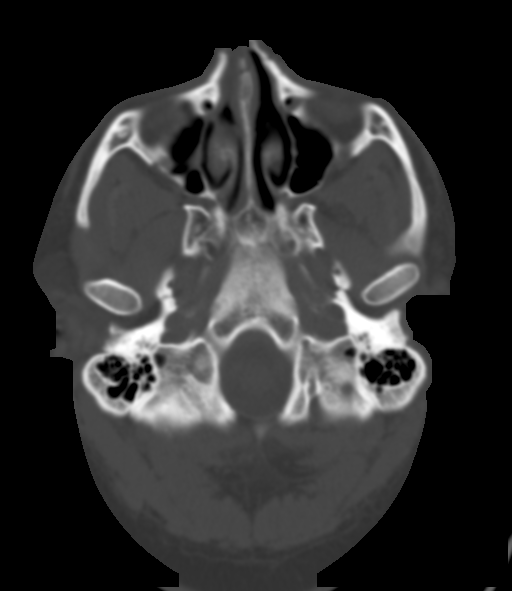
[im 15/59  brain]
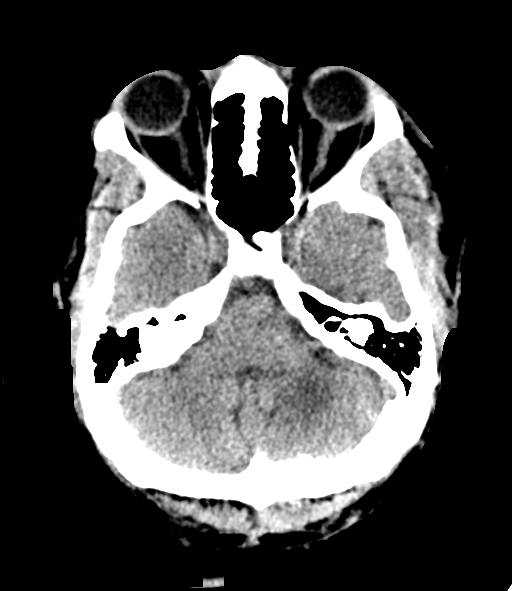
[im 22/59  brain]
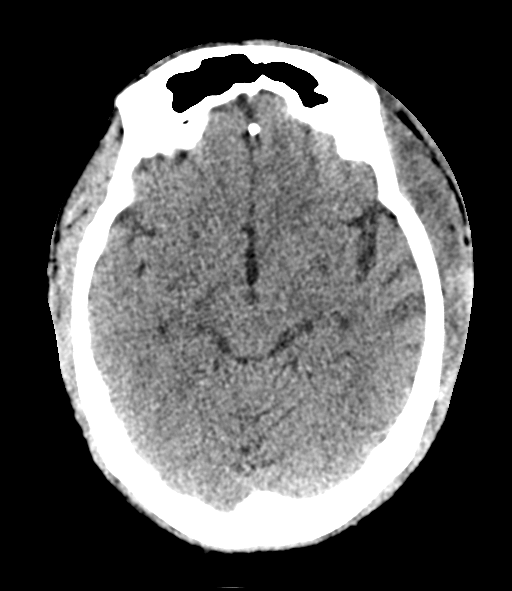
[im 30/59  brain]
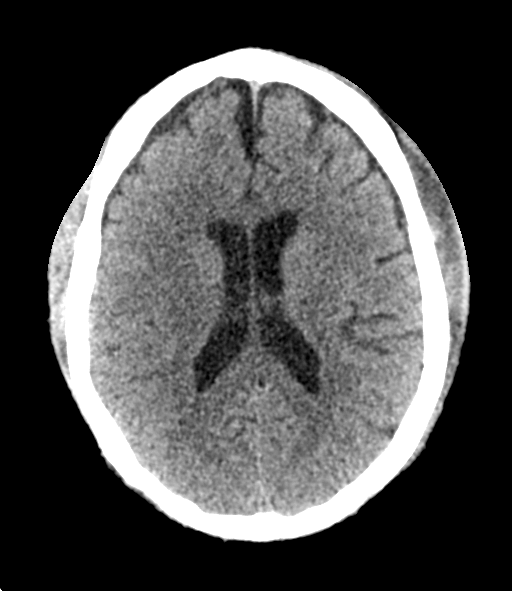
[im 37/59  brain]
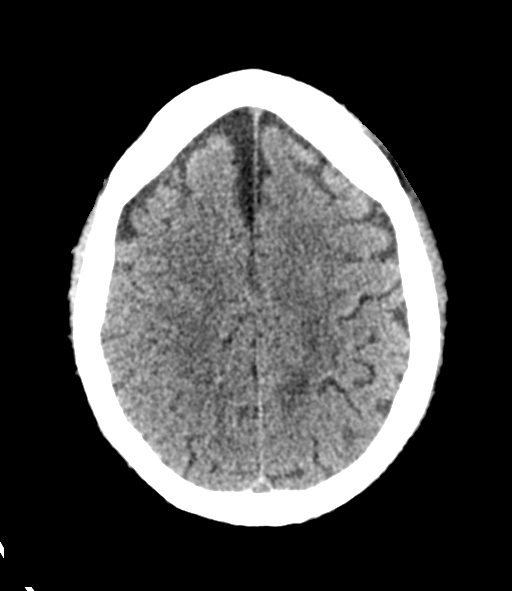
[im 37/59  bone]
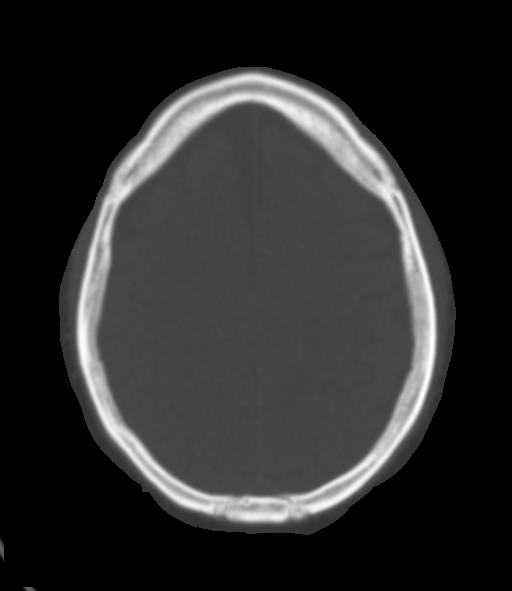
[im 44/59  brain]
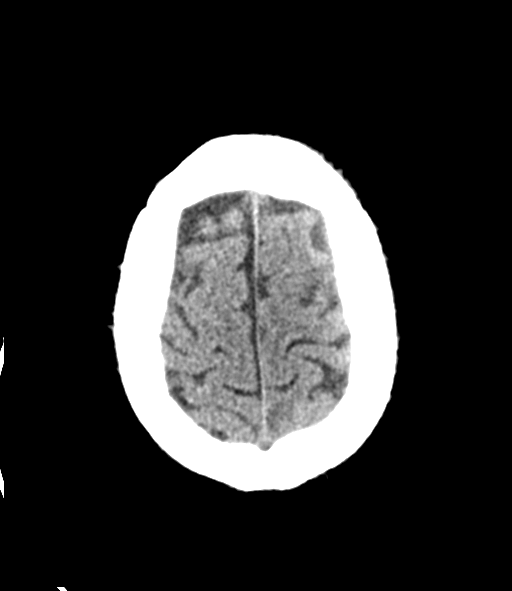
[im 51/59  brain]
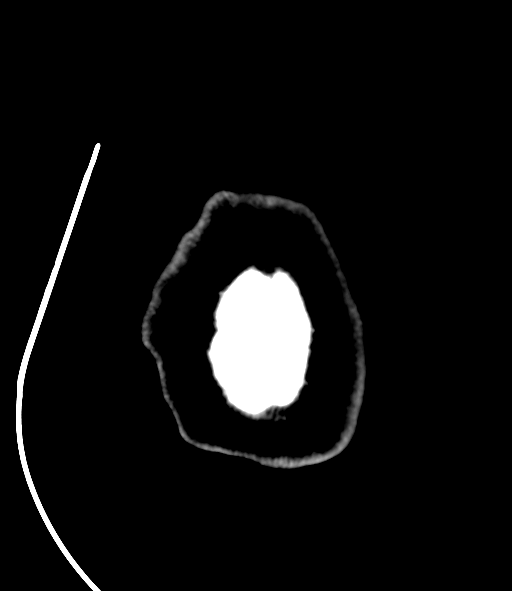

[15 of 47 positions shown; findings below may reference images not displayed]

FINDINGS: Brain: Patchy areas of low-attenuation are again identified in the
left frontoparietal lobes reflecting known developing recent
infarcts. There is no acute intracranial hemorrhage or mass effect.
Ventricles are stable in size.

Vascular: No new finding.

Skull: Calvarium is unremarkable.

Sinuses/Orbits: No acute finding.

Other: None.
IMPRESSION: Redemonstration of recent left frontoparietal infarcts. No acute
intracranial hemorrhage.
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
## 2020-02-09 MED ORDER — LORAZEPAM 1 MG PO TABS: 1.0000 mg | ORAL_TABLET | ORAL | Status: AC | PRN

## 2020-02-09 MED ORDER — LABETALOL HCL 5 MG/ML IV SOLN
20.0000 mg | Freq: Once | INTRAVENOUS | Status: AC
  Administered 2020-02-09: 20 mg via INTRAVENOUS
  Filled 2020-02-09: qty 4

## 2020-02-09 MED ORDER — LORAZEPAM 2 MG/ML IJ SOLN
1.0000 mg | INTRAMUSCULAR | Status: AC | PRN
  Administered 2020-02-09 (×2): 2 mg via INTRAVENOUS
  Filled 2020-02-09 (×2): qty 1

## 2020-02-09 NOTE — Progress Notes (Signed)
   02/09/20 2000  Assess: MEWS Score  Temp (!) 102.3 F (39.1 C)  BP (!) 186/96  Pulse Rate (!) 120  ECG Heart Rate (!) 119  Resp (!) 30  Level of Consciousness Alert  SpO2 99 %  O2 Device Nasal Cannula  Patient Activity (if Appropriate) In bed  O2 Flow Rate (L/min) 2 L/min  Assess: MEWS Score  MEWS Temp 2  MEWS Systolic 0  MEWS Pulse 2  MEWS RR 2  MEWS LOC 0  MEWS Score 6  MEWS Score Color Red  Assess: if the MEWS score is Yellow or Red  Were vital signs taken at a resting state? Yes  Focused Assessment No change from prior assessment  Early Detection of Sepsis Score *See Row Information* Low  MEWS guidelines implemented *See Row Information* No, previously red, continue vital signs every 4 hours  Treat  MEWS Interventions Administered prn meds/treatments  Pain Scale 0-10  Pain Score 0  Take Vital Signs  Increase Vital Sign Frequency   (Will reasses in one hour. Red previously, continuing Q4hr)  Escalate  MEWS: Escalate Red: discuss with charge nurse/RN and provider, consider discussing with RRT  Notify: Charge Nurse/RN  Name of Charge Nurse/RN Notified Freeman Caldron, RN  Date Charge Nurse/RN Notified 02/09/20  Time Charge Nurse/RN Notified 2040  Notify: Provider  Provider Name/Title Cliffton Asters, MD (On call trauma MD)  Date Provider Notified 02/09/20  Time Provider Notified 2031  Notification Type Page  Notification Reason Other (Comment) (Notify of red mews, Fever and HTN)  Response See new orders (Order parameters for hydralazine changed, PRN tylenol for fe)  Date of Provider Response 02/09/20  Time of Provider Response 2028

## 2020-02-09 NOTE — Progress Notes (Signed)
ANTICOAGULATION CONSULT NOTE   Pharmacy Consult for IV heparin Indication: UE DVT, stroke  No Known Allergies  Patient Measurements: Height: 5\' 7"  (170.2 cm) Weight: 93.2 kg (205 lb 7.5 oz) IBW/kg (Calculated) : 66.1 Heparin Dosing Weight: 85.8 kg  Vital Signs: Temp: 99.9 F (37.7 C) (10/28 1210) Temp Source: Oral (10/28 1210) BP: 164/88 (10/28 1408) Pulse Rate: 105 (10/28 1408)  Labs: Recent Labs    02/07/20 0500 02/07/20 1000 02/08/20 0550 02/08/20 1318 02/08/20 2030 02/09/20 0602 02/09/20 1653  HGB 7.5*  --  7.3*  --   --  7.1*  --   HCT 23.9*  --  23.3*  --   --  23.3*  --   PLT 616*  --  663*  --   --  680*  --   HEPARINUNFRC  --    < > <0.10*   < > 0.32 0.24* 0.56  CREATININE 1.16  --  1.20  --   --  1.12  --    < > = values in this interval not displayed.    Estimated Creatinine Clearance: 107.8 mL/min (by C-G formula based on SCr of 1.12 mg/dL).   Medical History: History reviewed. No pertinent past medical history.  Medications:  Infusions:  . sodium chloride Stopped (02/01/20 1518)  . dextrose 5 % and 0.45% NaCl Stopped (02/06/20 1709)  . heparin 2,050 Units/hr (02/09/20 1221)    Assessment: 29 yo male with new stroke, UE DVT and anemia. Pharmacy asked to cautiously start IV heparin.     Heparin level now supratherapeutic for lower goal after rate increase this AM. The heparin drip is infusing into a new PICC placed 10/27. Hgb low but stable 7.1, plt 680. No bleeding or issues with infusion per discussion with RN.  Goal of Therapy:  Heparin level 0.3-0.5 stroke dosing Monitor platelets by anticoagulation protocol: Yes   Plan:  Decrease heparin to 1950 units/hr Check 6hr heparin level Monitor daily heparin level and CBC, s/sx bleeding   11/27, PharmD, BCPS Please check AMION for all [facility] contact numbers Clinical Pharmacist 02/09/2020 5:39 PM

## 2020-02-09 NOTE — Progress Notes (Signed)
Pt transferred to [address] room 6, report given to Victoria Vera. Pt on monitor, staff notified of his arrival. Girlfriend Ms Herbin at bedside.
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
## 2020-02-10 ENCOUNTER — Other Ambulatory Visit (HOSPITAL_COMMUNITY): Payer: Self-pay

## 2020-02-10 LAB — BLOOD CULTURE ID PANEL (REFLEXED) - BCID2

## 2020-02-10 LAB — MAGNESIUM: Magnesium: 1.9 mg/dL (ref 1.7–2.4)

## 2020-02-10 LAB — HEPARIN LEVEL (UNFRACTIONATED)
Heparin Unfractionated: 1.38 [IU]/mL — ABNORMAL HIGH (ref 0.30–0.70)
Heparin Unfractionated: 0.4 IU/mL (ref 0.30–0.70)
Heparin Unfractionated: 0.45 [IU]/mL (ref 0.30–0.70)

## 2020-02-10 LAB — BASIC METABOLIC PANEL
Anion gap: 8 (ref 5–15)
BUN: 17 mg/dL (ref 6–20)
CO2: 20 mmol/L — ABNORMAL LOW (ref 22–32)
Calcium: 8.8 mg/dL — ABNORMAL LOW (ref 8.9–10.3)
Chloride: 109 mmol/L (ref 98–111)
Creatinine, Ser: 1.08 mg/dL (ref 0.61–1.24)
GFR, Estimated: >60 mL/min (ref >60)
Glucose, Bld: 107 mg/dL — ABNORMAL HIGH (ref 70–99)
Potassium: 4.4 mmol/L (ref 3.5–5.1)
Sodium: 137 mmol/L (ref 135–145)

## 2020-02-10 LAB — GLUCOSE, CAPILLARY
Glucose-Capillary: 100 mg/dL — ABNORMAL HIGH (ref 70–99)
Glucose-Capillary: 101 mg/dL — ABNORMAL HIGH (ref 70–99)
Glucose-Capillary: 122 mg/dL — ABNORMAL HIGH (ref 70–99)
Glucose-Capillary: 122 mg/dL — ABNORMAL HIGH (ref 70–99)
Glucose-Capillary: 97 mg/dL (ref 70–99)
Glucose-Capillary: 99 mg/dL (ref 70–99)

## 2020-02-10 LAB — CBC
HCT: 24.4 % — ABNORMAL LOW (ref 39.0–52.0)
Hemoglobin: 7.8 g/dL — ABNORMAL LOW (ref 13.0–17.0)
MCH: 27.6 pg (ref 26.0–34.0)
MCHC: 32 g/dL (ref 30.0–36.0)
MCV: 86.2 fL (ref 80.0–100.0)
Platelets: 767 10*3/uL — ABNORMAL HIGH (ref 150–400)
RBC: 2.83 MIL/uL — ABNORMAL LOW (ref 4.22–5.81)
RDW: 14.6 % (ref 11.5–15.5)
WBC: 18.3 10*3/uL — ABNORMAL HIGH (ref 4.0–10.5)
nRBC: 0 % (ref 0.0–0.2)

## 2020-02-10 LAB — PHOSPHORUS: Phosphorus: 3.9 mg/dL (ref 2.5–4.6)

## 2020-02-10 LAB — AMMONIA: Ammonia: 38 umol/L — ABNORMAL HIGH (ref 9–35)

## 2020-02-10 MED ORDER — LABETALOL HCL 5 MG/ML IV SOLN
20.0000 mg | Freq: Once | INTRAVENOUS | Status: AC
  Administered 2020-02-10: 20 mg via INTRAVENOUS
  Filled 2020-02-10: qty 4
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
## 2020-02-10 NOTE — Progress Notes (Signed)
ANTICOAGULATION CONSULT NOTE   Pharmacy Consult for IV heparin Indication: UE DVT, stroke  No Known Allergies  Patient Measurements: Height: 5\' 7"  (170.2 cm) Weight: 85.3 kg (188 lb 0.8 oz) IBW/kg (Calculated) : 66.1 Heparin Dosing Weight: 77 kg  Vital Signs: Temp: 100.2 F (37.9 C) (10/29 1100) Temp Source: Axillary (10/29 1100) BP: 154/86 (10/29 1100) Pulse Rate: 106 (10/29 1100)  Labs: Recent Labs    02/08/20 0550 02/08/20 1318 02/09/20 0602 02/09/20 1653 02/10/20 0035 02/10/20 0343 02/10/20 1029  HGB 7.3*  --  7.1*  --   --  7.8*  --   HCT 23.3*  --  23.3*  --   --  24.4*  --   PLT 663*  --  680*  --   --  767*  --   HEPARINUNFRC <0.10*   < > 0.24*   < > 1.38* 0.40 0.45  CREATININE 1.20  --  1.12  --   --  1.08  --    < > = values in this interval not displayed.    Estimated Creatinine Clearance: 107.2 mL/min (by C-G formula based on SCr of 1.08 mg/dL).   Medical History: History reviewed. No pertinent past medical history.  Medications:  Infusions:  . sodium chloride Stopped (02/01/20 1518)  . dextrose 5 % and 0.45% NaCl Stopped (02/06/20 1709)  . heparin 1,950 Units/hr (02/10/20 0031)    Assessment: 29 yo male with new stroke, UE DVT and anemia.  Pharmacy asked to cautiously start IV heparin.     Heparin level this morning remains therapeutic (HL 0.45 << 0.4, goal of 0.3-0.5). The heparin drip is infusing into a new PICC placed 10/27 - no bleeding or issues reported per RN. Hgb low but stable - 7.8  Goal of Therapy:  Heparin level 0.3-0.5 stroke dosing Monitor platelets by anticoagulation protocol: Yes   Plan:  - Continue Heparin at 1950 units/hr (19.5 ml/hr) - Will continue to monitor for any signs/symptoms of bleeding and will follow up with heparin level in the a.m.   Thank you for allowing pharmacy to be a part of this patient's care.  11/27, PharmD, BCPS Clinical Pharmacist Clinical phone for 02/10/2020: 02/12/2020 02/10/2020  11:39 AM   **Pharmacist phone directory can now be found on [email or web address] ([email or web address]).  Listed under [facility].
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
## 2020-02-10 NOTE — Progress Notes (Signed)
Inpatient Rehabilitation-Admissions Coordinator   [facility] has attempted to reach out to pt's family to discuss CIR and program details; No contact made at this time. Will continue to follow along for medical stability and confirmation of family support at DC.   Cheri Rous, OTR/L  Rehab Admissions Coordinator  [phone] 02/10/2020 2:27 PM
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
## 2020-02-11 LAB — GLUCOSE, CAPILLARY
Glucose-Capillary: 100 mg/dL — ABNORMAL HIGH (ref 70–99)
Glucose-Capillary: 103 mg/dL — ABNORMAL HIGH (ref 70–99)
Glucose-Capillary: 107 mg/dL — ABNORMAL HIGH (ref 70–99)
Glucose-Capillary: 113 mg/dL — ABNORMAL HIGH (ref 70–99)
Glucose-Capillary: 116 mg/dL — ABNORMAL HIGH (ref 70–99)
Glucose-Capillary: 95 mg/dL (ref 70–99)
Glucose-Capillary: 96 mg/dL (ref 70–99)

## 2020-02-11 MED ORDER — APIXABAN 5 MG PO TABS
5.0000 mg | ORAL_TABLET | Freq: Two times a day (BID) | ORAL | Status: DC
  Administered 2020-02-11 – 2020-02-15 (×9): 5 mg via ORAL
  Filled 2020-02-11 (×9): qty 1

## 2020-02-11 MED ORDER — LACTULOSE 10 GM/15ML PO SOLN
20.0000 g | Freq: Two times a day (BID) | ORAL | Status: DC
  Administered 2020-02-11 – 2020-02-15 (×9): 20 g via ORAL
  Filled 2020-02-11 (×9): qty 30

## 2020-02-11 NOTE — Progress Notes (Signed)
BP= 187/55mmhg, HR=120-140 bpm medicated see MAR

## 2020-02-11 NOTE — Progress Notes (Signed)
ANTICOAGULATION CONSULT NOTE   Pharmacy Consult for IV heparin to apixaban Indication: UE DVT, stroke  No Known Allergies  Patient Measurements: Height: 5\' 7"  (170.2 cm) Weight: 84.7 kg (186 lb 11.7 oz) IBW/kg (Calculated) : 66.1 Heparin Dosing Weight: 77 kg  Vital Signs: Temp: 99.5 F (37.5 C) (10/30 0747) Temp Source: Oral (10/30 0747) BP: 151/77 (10/30 0747) Pulse Rate: 114 (10/29 2144)  Labs: Recent Labs    02/09/20 0602 02/09/20 1653 02/10/20 0035 02/10/20 0343 02/10/20 1029  HGB 7.1*  --   --  7.8*  --   HCT 23.3*  --   --  24.4*  --   PLT 680*  --   --  767*  --   HEPARINUNFRC 0.24*   < > 1.38* 0.40 0.45  CREATININE 1.12  --   --  1.08  --    < > = values in this interval not displayed.    Estimated Creatinine Clearance: 106.8 mL/min (by C-G formula based on SCr of 1.08 mg/dL).  Assessment: 29 yo male with new stroke, UE DVT and anemia.  Pharmacy asked to cautiously start IV heparin - now to transition to apixaban for 6 months per stroke team. Discussed apixaban dosing with Dr 34 and decided maintenance dosing is appropriate with recent stroke. No bleeding noted, no new labs for today.  Goal of Therapy:  Full dose anticoagulation Monitor platelets by anticoagulation protocol: Yes   Plan:  Discontinue IV heparin drip Apixaban 5 mg PO bid Apixaban education prior to discharge Monitor for s/sx of bleeding Pharmacy signing off but will continue to follow peripherally - please re-consult if needed  Thank you for involving pharmacy in this patient's care.  Bedelia Person, PharmD, BCPS Clinical Pharmacist Clinical phone for 02/11/2020 until 3p is [phone] 02/11/2020 9:22 AM  **Pharmacist phone directory can be found on [email or web address] listed under [facility]**

## 2020-02-11 NOTE — Plan of Care (Signed)
  Problem: Clinical Measurements: Goal: Ability to maintain clinical measurements within normal limits will improve Outcome: Progressing Goal: Will remain free from infection Outcome: Progressing Goal: Diagnostic test results will improve Outcome: Progressing Goal: Respiratory complications will improve Outcome: Progressing Goal: Cardiovascular complication will be avoided Outcome: Progressing   Problem: Activity: Goal: Risk for activity intolerance will decrease Outcome: Progressing   Problem: Nutrition: Goal: Adequate nutrition will be maintained Outcome: Progressing   Problem: Elimination: Goal: Will not experience complications related to bowel motility Outcome: Progressing Goal: Will not experience complications related to urinary retention Outcome: Progressing   Problem: Respiratory: Goal: Ability to maintain a clear airway and adequate ventilation will improve Outcome: Progressing   Problem: Education: Goal: Knowledge of General Education information will improve Description: Including pain rating scale, medication(s)/side effects and non-pharmacologic comfort measures Outcome: Progressing   Problem: Health Behavior/Discharge Planning: Goal: Ability to manage health-related needs will improve Outcome: Progressing   Problem: Clinical Measurements: Goal: Ability to maintain clinical measurements within normal limits will improve Outcome: Progressing Goal: Will remain free from infection Outcome: Progressing Goal: Diagnostic test results will improve Outcome: Progressing Goal: Respiratory complications will improve Outcome: Progressing Goal: Cardiovascular complication will be avoided Outcome: Progressing   Problem: Activity: Goal: Risk for activity intolerance will decrease Outcome: Progressing   Problem: Nutrition: Goal: Adequate nutrition will be maintained Outcome: Progressing   Problem: Coping: Goal: Level of anxiety will decrease Outcome:  Progressing   Problem: Elimination: Goal: Will not experience complications related to bowel motility Outcome: Progressing Goal: Will not experience complications related to urinary retention Outcome: Progressing   Problem: Pain Managment: Goal: General experience of comfort will improve Outcome: Progressing   Problem: Safety: Goal: Ability to remain free from injury will improve Outcome: Progressing   Problem: Skin Integrity: Goal: Risk for impaired skin integrity will decrease Outcome: Progressing   Problem: Education: Goal: Knowledge of disease or condition will improve Outcome: Progressing Goal: Knowledge of secondary prevention will improve Outcome: Progressing Goal: Knowledge of patient specific risk factors addressed and post discharge goals established will improve Outcome: Progressing Goal: Individualized Educational Video(s) Outcome: Progressing   Problem: Coping: Goal: Will verbalize positive feelings about self Outcome: Progressing Goal: Will identify appropriate support needs Outcome: Progressing   Problem: Health Behavior/Discharge Planning: Goal: Ability to manage health-related needs will improve Outcome: Progressing   Problem: Self-Care: Goal: Ability to participate in self-care as condition permits will improve Outcome: Progressing Goal: Verbalization of feelings and concerns over difficulty with self-care will improve Outcome: Progressing Goal: Ability to communicate needs accurately will improve Outcome: Progressing   Problem: Nutrition: Goal: Risk of aspiration will decrease Outcome: Progressing Goal: Dietary intake will improve Outcome: Progressing   Problem: Intracerebral Hemorrhage Tissue Perfusion: Goal: Complications of Intracerebral Hemorrhage will be minimized Outcome: Progressing   Problem: Ischemic Stroke/TIA Tissue Perfusion: Goal: Complications of ischemic stroke/TIA will be minimized Outcome: Progressing   Problem:  Spontaneous Subarachnoid Hemorrhage Tissue Perfusion: Goal: Complications of Spontaneous Subarachnoid Hemorrhage will be minimized Outcome: Progressing   
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
## 2020-02-12 LAB — CULTURE, BLOOD (ROUTINE X 2)

## 2020-02-12 LAB — GLUCOSE, CAPILLARY
Glucose-Capillary: 102 mg/dL — ABNORMAL HIGH (ref 70–99)
Glucose-Capillary: 104 mg/dL — ABNORMAL HIGH (ref 70–99)
Glucose-Capillary: 105 mg/dL — ABNORMAL HIGH (ref 70–99)
Glucose-Capillary: 107 mg/dL — ABNORMAL HIGH (ref 70–99)
Glucose-Capillary: 111 mg/dL — ABNORMAL HIGH (ref 70–99)
Glucose-Capillary: 123 mg/dL — ABNORMAL HIGH (ref 70–99)

## 2020-02-12 NOTE — Progress Notes (Signed)
Grandmother came today to visit and stated she had been trying to call but the patient didn't answer the phone. Christopher Marks was asleep. Grandmother talked with RN for a bit and discussed patient's confusion, TBI, need for blood thinners, and rehab. Grandmother stated she has raised Christopher Marks since he was 7months old and she will take care of him AFTER rehab. Grandmother stated she knows its going to be tough because she is taking care of a small child as well but that she will be fine. When patient woke up patient was heard cursing and yelling at the grandmother and his girlfriend on the phone that he was ready to go home and he was told by the doctor that he could go home and he was leaving now. Multiple attempts were made unsuccessfully to reorient and calm patient but he insisted that we were all wrong and he was going home now. Patient finally fell asleep and grandmother left for the day. Patient is currently resting quietly.
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
## 2020-02-12 NOTE — Plan of Care (Signed)
  Problem: Clinical Measurements: Goal: Ability to maintain clinical measurements within normal limits will improve Outcome: Progressing Goal: Will remain free from infection Outcome: Progressing Goal: Diagnostic test results will improve Outcome: Progressing Goal: Respiratory complications will improve Outcome: Progressing Goal: Cardiovascular complication will be avoided Outcome: Progressing   Problem: Activity: Goal: Risk for activity intolerance will decrease Outcome: Progressing   Problem: Nutrition: Goal: Adequate nutrition will be maintained Outcome: Progressing   Problem: Elimination: Goal: Will not experience complications related to bowel motility Outcome: Progressing Goal: Will not experience complications related to urinary retention Outcome: Progressing   Problem: Respiratory: Goal: Ability to maintain a clear airway and adequate ventilation will improve Outcome: Progressing   Problem: Education: Goal: Knowledge of General Education information will improve Description: Including pain rating scale, medication(s)/side effects and non-pharmacologic comfort measures Outcome: Progressing   Problem: Health Behavior/Discharge Planning: Goal: Ability to manage health-related needs will improve Outcome: Progressing   Problem: Clinical Measurements: Goal: Ability to maintain clinical measurements within normal limits will improve Outcome: Progressing Goal: Will remain free from infection Outcome: Progressing Goal: Diagnostic test results will improve Outcome: Progressing Goal: Respiratory complications will improve Outcome: Progressing Goal: Cardiovascular complication will be avoided Outcome: Progressing   Problem: Activity: Goal: Risk for activity intolerance will decrease Outcome: Progressing   Problem: Nutrition: Goal: Adequate nutrition will be maintained Outcome: Progressing   Problem: Coping: Goal: Level of anxiety will decrease Outcome:  Progressing   Problem: Elimination: Goal: Will not experience complications related to bowel motility Outcome: Progressing Goal: Will not experience complications related to urinary retention Outcome: Progressing   Problem: Pain Managment: Goal: General experience of comfort will improve Outcome: Progressing   Problem: Safety: Goal: Ability to remain free from injury will improve Outcome: Progressing   Problem: Skin Integrity: Goal: Risk for impaired skin integrity will decrease Outcome: Progressing   Problem: Education: Goal: Knowledge of disease or condition will improve Outcome: Progressing Goal: Knowledge of secondary prevention will improve Outcome: Progressing Goal: Knowledge of patient specific risk factors addressed and post discharge goals established will improve Outcome: Progressing Goal: Individualized Educational Video(s) Outcome: Progressing   Problem: Coping: Goal: Will verbalize positive feelings about self Outcome: Progressing Goal: Will identify appropriate support needs Outcome: Progressing   Problem: Health Behavior/Discharge Planning: Goal: Ability to manage health-related needs will improve Outcome: Progressing   Problem: Self-Care: Goal: Ability to participate in self-care as condition permits will improve Outcome: Progressing Goal: Verbalization of feelings and concerns over difficulty with self-care will improve Outcome: Progressing Goal: Ability to communicate needs accurately will improve Outcome: Progressing   Problem: Nutrition: Goal: Risk of aspiration will decrease Outcome: Progressing Goal: Dietary intake will improve Outcome: Progressing   Problem: Intracerebral Hemorrhage Tissue Perfusion: Goal: Complications of Intracerebral Hemorrhage will be minimized Outcome: Progressing   Problem: Ischemic Stroke/TIA Tissue Perfusion: Goal: Complications of ischemic stroke/TIA will be minimized Outcome: Progressing   Problem:  Spontaneous Subarachnoid Hemorrhage Tissue Perfusion: Goal: Complications of Spontaneous Subarachnoid Hemorrhage will be minimized Outcome: Progressing

## 2020-02-13 LAB — GLUCOSE, CAPILLARY
Glucose-Capillary: 105 mg/dL — ABNORMAL HIGH (ref 70–99)
Glucose-Capillary: 131 mg/dL — ABNORMAL HIGH (ref 70–99)
Glucose-Capillary: 103 mg/dL — ABNORMAL HIGH (ref 70–99)
Glucose-Capillary: 105 mg/dL — ABNORMAL HIGH (ref 70–99)
Glucose-Capillary: 121 mg/dL — ABNORMAL HIGH (ref 70–99)

## 2020-02-13 LAB — CBC
HCT: 26.2 % — ABNORMAL LOW (ref 39.0–52.0)
Hemoglobin: 8.2 g/dL — ABNORMAL LOW (ref 13.0–17.0)
MCH: 27.2 pg (ref 26.0–34.0)
MCHC: 31.3 g/dL (ref 30.0–36.0)
MCV: 87 fL (ref 80.0–100.0)
Platelets: 781 10*3/uL — ABNORMAL HIGH (ref 150–400)
RBC: 3.01 MIL/uL — ABNORMAL LOW (ref 4.22–5.81)
RDW: 14.6 % (ref 11.5–15.5)
WBC: 17.1 10*3/uL — ABNORMAL HIGH (ref 4.0–10.5)
nRBC: 0.1 % (ref 0.0–0.2)

## 2020-02-13 LAB — COMPREHENSIVE METABOLIC PANEL
ALT: 255 U/L — ABNORMAL HIGH (ref 0–44)
AST: 161 U/L — ABNORMAL HIGH (ref 15–41)
Albumin: 2.4 g/dL — ABNORMAL LOW (ref 3.5–5.0)
Alkaline Phosphatase: 154 U/L — ABNORMAL HIGH (ref 38–126)
Anion gap: 8 (ref 5–15)
BUN: 19 mg/dL (ref 6–20)
CO2: 22 mmol/L (ref 22–32)
Calcium: 9 mg/dL (ref 8.9–10.3)
Chloride: 104 mmol/L (ref 98–111)
Creatinine, Ser: 1.04 mg/dL (ref 0.61–1.24)
GFR, Estimated: >60 mL/min (ref >60)
Glucose, Bld: 133 mg/dL — ABNORMAL HIGH (ref 70–99)
Potassium: 4.3 mmol/L (ref 3.5–5.1)
Sodium: 134 mmol/L — ABNORMAL LOW (ref 135–145)
Total Bilirubin: 1.4 mg/dL — ABNORMAL HIGH (ref 0.3–1.2)
Total Protein: 7.3 g/dL (ref 6.5–8.1)

## 2020-02-13 LAB — AMMONIA: Ammonia: 26 umol/L (ref 9–35)

## 2020-02-13 MED ORDER — METOPROLOL TARTRATE 50 MG PO TABS
75.0000 mg | ORAL_TABLET | Freq: Two times a day (BID) | ORAL | Status: DC
  Administered 2020-02-13 – 2020-02-15 (×4): 75 mg via ORAL
  Filled 2020-02-13 (×4): qty 1

## 2020-02-13 MED ORDER — POLYETHYLENE GLYCOL 3350 17 G PO PACK
17.0000 g | PACK | Freq: Every day | ORAL | Status: DC
  Administered 2020-02-13 – 2020-02-15 (×3): 17 g via ORAL
  Filled 2020-02-13 (×3): qty 1

## 2020-02-13 MED ORDER — METHOCARBAMOL 500 MG PO TABS
500.0000 mg | ORAL_TABLET | Freq: Three times a day (TID) | ORAL | Status: DC
  Administered 2020-02-13 – 2020-02-14 (×4): 500 mg via ORAL
  Filled 2020-02-13 (×4): qty 1

## 2020-02-13 NOTE — Progress Notes (Signed)
Inpatient Rehabilitation-Admissions Coordinator   Was able to speak with the patient's grandmother via phone. She is interested in CIR but is concerned about the physical assistance needed at DC as well as the cost of CIR. Pt's grandmother plans to speak with [facility] tomorrow for final decision on placement and rehab plans.   Cheri Rous, OTR/L  Rehab Admissions Coordinator  [phone] 02/13/2020 1:36 PM

## 2020-02-13 NOTE — Progress Notes (Signed)
Transitions of Care Pharmacist Note  Christopher Marks is a 29 y.o. male that has been diagnosed with DVT and will be prescribed Eliquis (apixaban) at discharge.   Patient Education: I provided the following education on Eliquis to the patient: Described what the medication is Signs of bleeding Signs/symptoms of VTE and stroke  Answered their questions   Patient was altered and asked some questions that did not seem based in reality. He did state his understanding of twice a day dosing and a 6 month duration, and would like to be sent home with his medications at discharge. Will need additional education reinforcement when caregiver available.  Discharge Medications Plan: The patient wants to have their discharge medications filled by the Transitions of Care pharmacy rather than their usual pharmacy.  The discharge orders pharmacy has been changed to the Transitions of Care pharmacy, the patient will receive a phone call regarding co-pay, and their medications will be delivered by the Transitions of Care pharmacy.    Thank you,   Lamar Sprinkles, PharmD PGY1 Pharmacy Resident 02/13/2020 5:30 PM  February 13, 2020

## 2020-02-13 NOTE — Discharge Instructions (Signed)
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
## 2020-02-13 NOTE — Progress Notes (Signed)
Inpatient Rehabilitation-Admissions Coordinator   Met with patient this AM as he was up in the recliner. Discussed rehab in depth and the pt has stated he is open to CIR. He wants to discuss the program and cost with his grandmother before making any final decision. AC continues efforts to contact pt's grandmother for rehab discussion. Will update once I have been able to confirm a preference for CIR.   Please call if questions.   Raechel Ache, OTR/L  Rehab Admissions Coordinator  [phone] 02/13/2020 11:46 AM

## 2020-02-14 LAB — GLUCOSE, CAPILLARY
Glucose-Capillary: 105 mg/dL — ABNORMAL HIGH (ref 70–99)
Glucose-Capillary: 112 mg/dL — ABNORMAL HIGH (ref 70–99)
Glucose-Capillary: 113 mg/dL — ABNORMAL HIGH (ref 70–99)
Glucose-Capillary: 105 mg/dL — ABNORMAL HIGH (ref 70–99)

## 2020-02-14 LAB — CULTURE, BLOOD (ROUTINE X 2): Culture: NO GROWTH

## 2020-02-14 LAB — AMMONIA: Ammonia: 30 umol/L (ref 9–35)

## 2020-02-14 MED ORDER — METHOCARBAMOL 500 MG PO TABS
1000.0000 mg | ORAL_TABLET | Freq: Four times a day (QID) | ORAL | Status: DC
  Administered 2020-02-14 – 2020-02-15 (×6): 1000 mg via ORAL
  Filled 2020-02-14 (×6): qty 2

## 2020-02-14 MED ORDER — BISACODYL 10 MG RE SUPP
10.0000 mg | Freq: Once | RECTAL | Status: AC
  Administered 2020-02-14: 10 mg via RECTAL
  Filled 2020-02-14: qty 1

## 2020-02-14 MED ORDER — LORAZEPAM 2 MG/ML IJ SOLN
0.5000 mg | Freq: Four times a day (QID) | INTRAMUSCULAR | Status: DC | PRN
  Administered 2020-02-15: 0.5 mg via INTRAVENOUS
  Filled 2020-02-14: qty 1
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
## 2020-02-14 NOTE — Progress Notes (Signed)
Inpatient Rehabilitation-Admissions Coordinator   Pt's grandmother is in support of CIR program. [facility] will continue to follow pt for bed availability as I currently do not have a bed open for him today.   Please call if questions.   Cheri Rous, OTR/L  Rehab Admissions Coordinator  [phone] 02/14/2020 12:06 PM
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
## 2020-02-15 ENCOUNTER — Inpatient Hospital Stay (HOSPITAL_COMMUNITY): Payer: No Typology Code available for payment source

## 2020-02-15 ENCOUNTER — Other Ambulatory Visit: Payer: Self-pay

## 2020-02-15 ENCOUNTER — Encounter (HOSPITAL_COMMUNITY): Payer: Self-pay | Admitting: Emergency Medicine

## 2020-02-15 ENCOUNTER — Encounter (HOSPITAL_COMMUNITY): Payer: Self-pay | Admitting: Physical Medicine & Rehabilitation

## 2020-02-15 ENCOUNTER — Inpatient Hospital Stay (HOSPITAL_COMMUNITY)
Admission: RE | Admit: 2020-02-15 | Discharge: 2020-03-12 | DRG: 057 | Disposition: A | Discharge status: Home / Self Care | Payer: Self-pay | Source: Intra-hospital | Attending: Physical Medicine & Rehabilitation | Admitting: Physical Medicine & Rehabilitation

## 2020-02-15 DIAGNOSIS — I69322 Dysarthria following cerebral infarction: Secondary | ICD-10-CM

## 2020-02-15 DIAGNOSIS — G8918 Other acute postprocedural pain: Secondary | ICD-10-CM

## 2020-02-15 DIAGNOSIS — S36892A Contusion of other intra-abdominal organs, initial encounter: Secondary | ICD-10-CM

## 2020-02-15 DIAGNOSIS — S82401D Unspecified fracture of shaft of right fibula, subsequent encounter for closed fracture with routine healing: Secondary | ICD-10-CM

## 2020-02-15 DIAGNOSIS — N390 Urinary tract infection, site not specified: Secondary | ICD-10-CM | POA: Diagnosis not present

## 2020-02-15 DIAGNOSIS — Z5329 Procedure and treatment not carried out because of patient's decision for other reasons: Secondary | ICD-10-CM | POA: Diagnosis not present

## 2020-02-15 DIAGNOSIS — I69351 Hemiplegia and hemiparesis following cerebral infarction affecting right dominant side: Principal | ICD-10-CM

## 2020-02-15 DIAGNOSIS — S36039D Unspecified laceration of spleen, subsequent encounter: Secondary | ICD-10-CM

## 2020-02-15 DIAGNOSIS — S2249XD Multiple fractures of ribs, unspecified side, subsequent encounter for fracture with routine healing: Secondary | ICD-10-CM

## 2020-02-15 DIAGNOSIS — S0990XA Unspecified injury of head, initial encounter: Secondary | ICD-10-CM

## 2020-02-15 DIAGNOSIS — M542 Cervicalgia: Secondary | ICD-10-CM | POA: Diagnosis not present

## 2020-02-15 DIAGNOSIS — D72829 Elevated white blood cell count, unspecified: Secondary | ICD-10-CM

## 2020-02-15 DIAGNOSIS — S2249XA Multiple fractures of ribs, unspecified side, initial encounter for closed fracture: Secondary | ICD-10-CM

## 2020-02-15 DIAGNOSIS — G441 Vascular headache, not elsewhere classified: Secondary | ICD-10-CM | POA: Diagnosis not present

## 2020-02-15 DIAGNOSIS — I639 Cerebral infarction, unspecified: Secondary | ICD-10-CM

## 2020-02-15 DIAGNOSIS — R7401 Elevation of levels of liver transaminase levels: Secondary | ICD-10-CM | POA: Diagnosis not present

## 2020-02-15 DIAGNOSIS — T07XXXA Unspecified multiple injuries, initial encounter: Secondary | ICD-10-CM

## 2020-02-15 DIAGNOSIS — R339 Retention of urine, unspecified: Secondary | ICD-10-CM | POA: Diagnosis not present

## 2020-02-15 DIAGNOSIS — R509 Fever, unspecified: Secondary | ICD-10-CM

## 2020-02-15 DIAGNOSIS — F1010 Alcohol abuse, uncomplicated: Secondary | ICD-10-CM | POA: Diagnosis present

## 2020-02-15 DIAGNOSIS — B9620 Unspecified Escherichia coli [E. coli] as the cause of diseases classified elsewhere: Secondary | ICD-10-CM | POA: Diagnosis not present

## 2020-02-15 DIAGNOSIS — R159 Full incontinence of feces: Secondary | ICD-10-CM | POA: Diagnosis not present

## 2020-02-15 DIAGNOSIS — R000 Tachycardia, unspecified: Secondary | ICD-10-CM | POA: Diagnosis not present

## 2020-02-15 DIAGNOSIS — D62 Acute posthemorrhagic anemia: Secondary | ICD-10-CM | POA: Diagnosis present

## 2020-02-15 DIAGNOSIS — K5903 Drug induced constipation: Secondary | ICD-10-CM | POA: Diagnosis not present

## 2020-02-15 DIAGNOSIS — S82201D Unspecified fracture of shaft of right tibia, subsequent encounter for closed fracture with routine healing: Secondary | ICD-10-CM | POA: Diagnosis not present

## 2020-02-15 DIAGNOSIS — Z87820 Personal history of traumatic brain injury: Secondary | ICD-10-CM

## 2020-02-15 DIAGNOSIS — Z7141 Alcohol abuse counseling and surveillance of alcoholic: Secondary | ICD-10-CM | POA: Diagnosis not present

## 2020-02-15 DIAGNOSIS — M25361 Other instability, right knee: Secondary | ICD-10-CM | POA: Diagnosis present

## 2020-02-15 DIAGNOSIS — F10920 Alcohol use, unspecified with intoxication, uncomplicated: Secondary | ICD-10-CM

## 2020-02-15 DIAGNOSIS — I82622 Acute embolism and thrombosis of deep veins of left upper extremity: Secondary | ICD-10-CM | POA: Diagnosis present

## 2020-02-15 DIAGNOSIS — I82A12 Acute embolism and thrombosis of left axillary vein: Secondary | ICD-10-CM

## 2020-02-15 DIAGNOSIS — E871 Hypo-osmolality and hyponatremia: Secondary | ICD-10-CM | POA: Diagnosis present

## 2020-02-15 DIAGNOSIS — R5081 Fever presenting with conditions classified elsewhere: Secondary | ICD-10-CM

## 2020-02-15 DIAGNOSIS — I10 Essential (primary) hypertension: Secondary | ICD-10-CM | POA: Diagnosis present

## 2020-02-15 DIAGNOSIS — S82401B Unspecified fracture of shaft of right fibula, initial encounter for open fracture type I or II: Secondary | ICD-10-CM

## 2020-02-15 DIAGNOSIS — R451 Restlessness and agitation: Secondary | ICD-10-CM | POA: Diagnosis not present

## 2020-02-15 DIAGNOSIS — S36112D Contusion of liver, subsequent encounter: Secondary | ICD-10-CM | POA: Diagnosis not present

## 2020-02-15 DIAGNOSIS — T148XXA Other injury of unspecified body region, initial encounter: Secondary | ICD-10-CM

## 2020-02-15 DIAGNOSIS — F1011 Alcohol abuse, in remission: Secondary | ICD-10-CM

## 2020-02-15 DIAGNOSIS — R7989 Other specified abnormal findings of blood chemistry: Secondary | ICD-10-CM

## 2020-02-15 DIAGNOSIS — S82201B Unspecified fracture of shaft of right tibia, initial encounter for open fracture type I or II: Secondary | ICD-10-CM

## 2020-02-15 DIAGNOSIS — I63412 Cerebral infarction due to embolism of left middle cerebral artery: Secondary | ICD-10-CM

## 2020-02-15 LAB — AMMONIA: Ammonia: 25 umol/L (ref 9–35)

## 2020-02-15 IMAGING — MR MR HEAD W/O CM
7 series · 48 of 48 positions shown · non-contrast
Comparison: [DATE] head CT and prior. [DATE] CTA head and
neck. [DATE] MRI/MRA head.

CLINICAL DATA: Stroke follow-up.

EXAM:
MRI HEAD WITHOUT CONTRAST
TECHNIQUE: Multiplanar, multiecho pulse sequences of the brain and surrounding
structures were obtained without intravenous contrast.

[Series 5: DWI · axial · 3.0mm · 0.88mm/px · z∈[-172,-35]mm · 14 of 100 slices shown (1 of 4)]
[im 1/100]
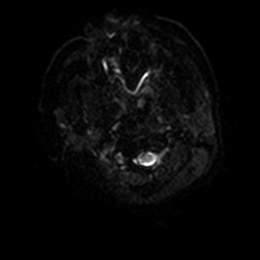
[im 8/100]
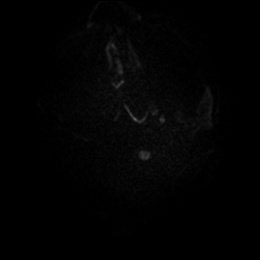
[im 16/100]
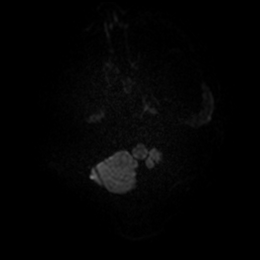
[im 23/100]
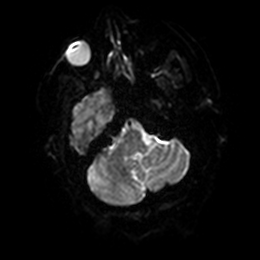
[im 31/100]
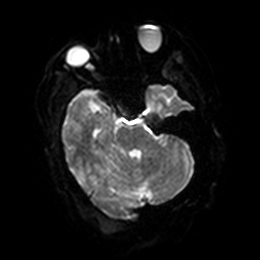
[im 39/100]
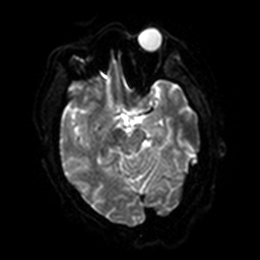
[im 46/100]
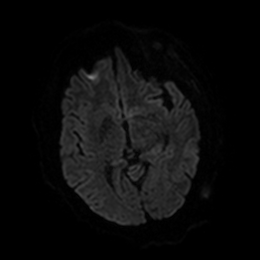
[im 54/100]
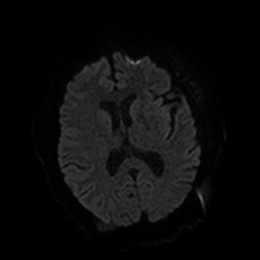
[im 61/100]
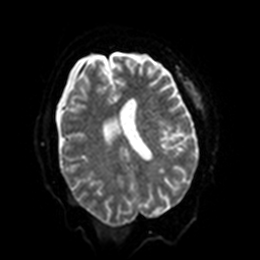
[im 69/100]
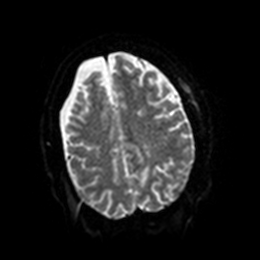
[im 77/100]
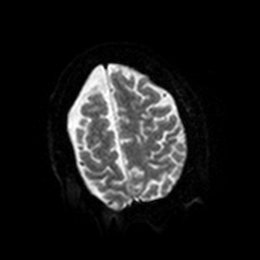
[im 84/100]
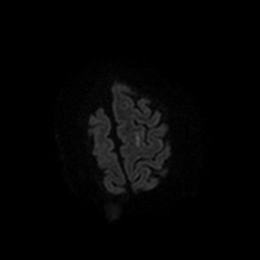
[im 92/100]
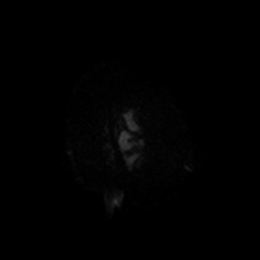
[im 100/100]
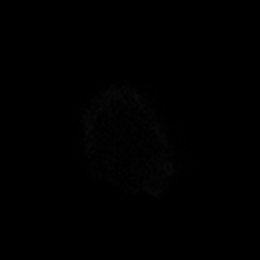

[Series 6: DWI · axial · 3.0mm · 0.88mm/px · z∈[-172,-35]mm · 7 of 49 slices shown (2 of 4)]
[im 1/49]
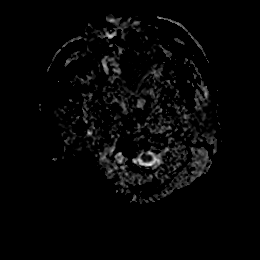
[im 9/49]
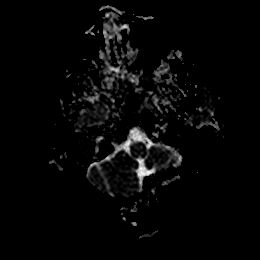
[im 17/49]
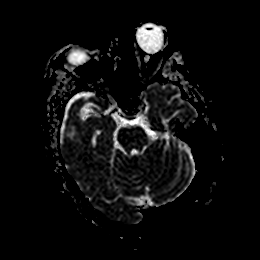
[im 25/49]
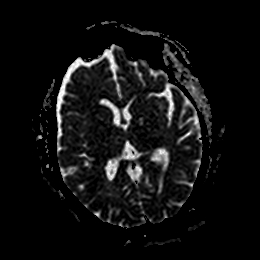
[im 33/49]
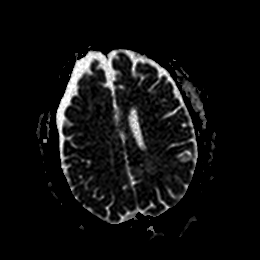
[im 41/49]
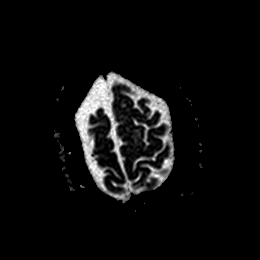
[im 49/49]
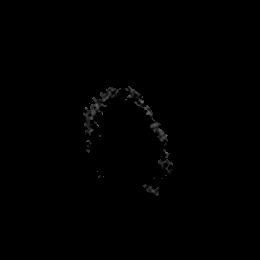

[Series 7: DWI · coronal · 4.0mm · 0.88mm/px · 11 of 75 slices shown (3 of 4)]
[im 1/75]
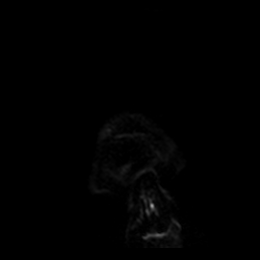
[im 8/75]
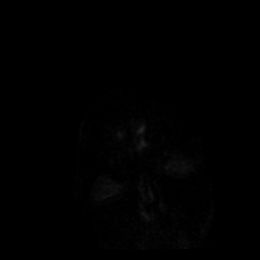
[im 15/75]
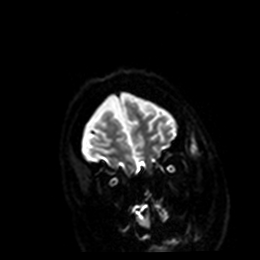
[im 23/75]
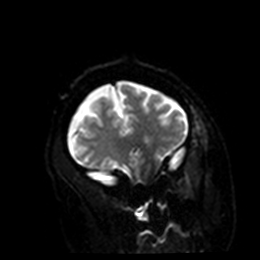
[im 30/75]
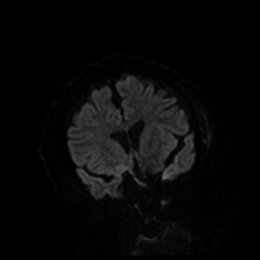
[im 38/75]
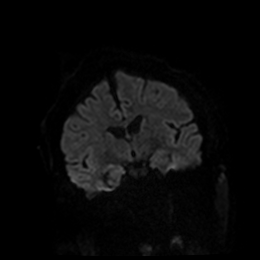
[im 45/75]
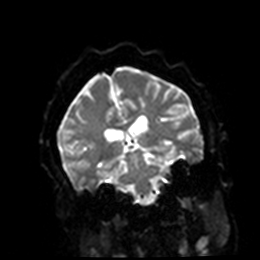
[im 52/75]
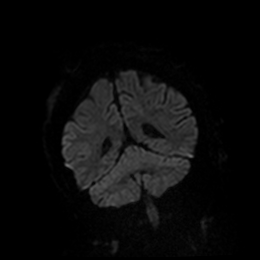
[im 60/75]
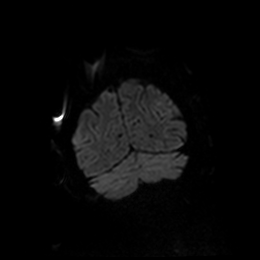
[im 67/75]
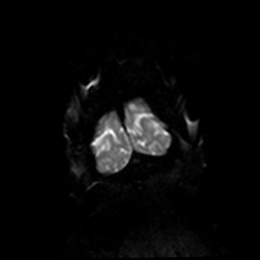
[im 75/75]
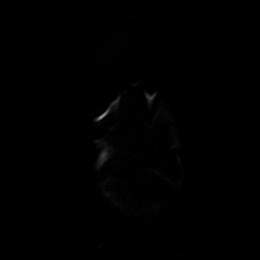

[Series 8: DWI · coronal · 4.0mm · 0.88mm/px · 5 of 38 slices shown (4 of 4)]
[im 1/38]
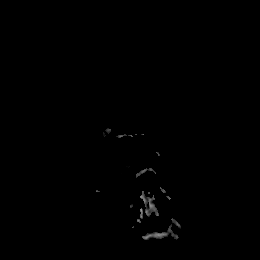
[im 10/38]
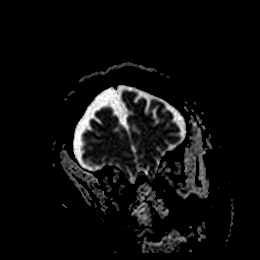
[im 19/38]
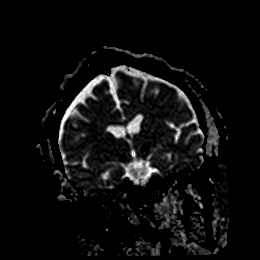
[im 28/38]
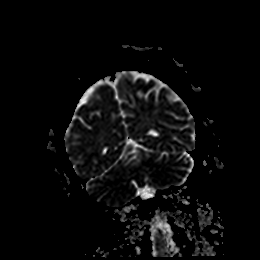
[im 38/38]
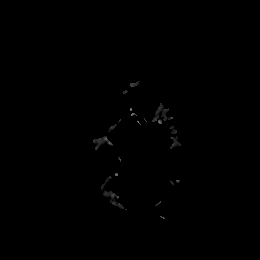

[Series 10: T2 · axial · 5.0mm · 0.72mm/px · z∈[-128,+14]mm · 4 of 25 slices shown]
[im 1/25]
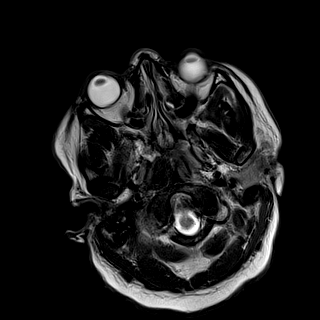
[im 9/25]
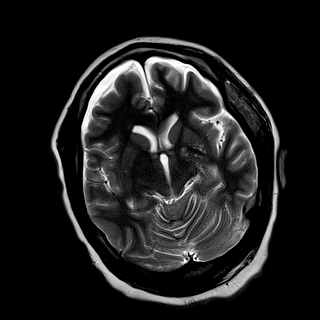
[im 17/25]
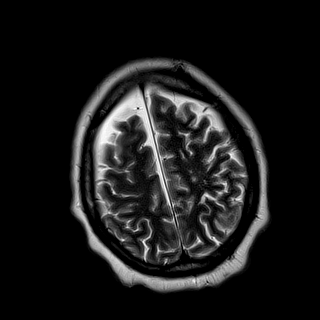
[im 25/25]
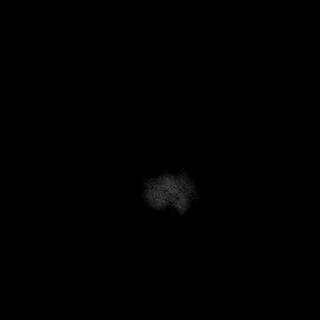

[Series 11: T1 · sagittal · 5.0mm · 0.75mm/px · 3 of 23 slices shown]
[im 1/23]
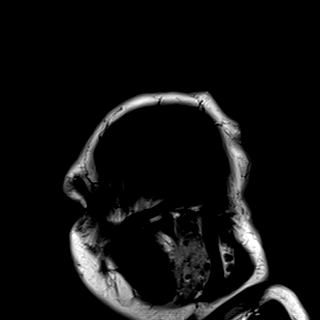
[im 12/23]
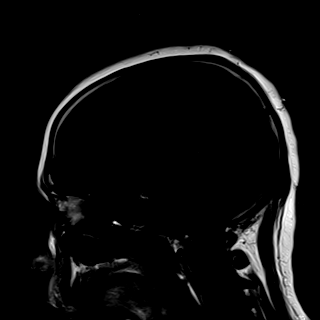
[im 23/23]
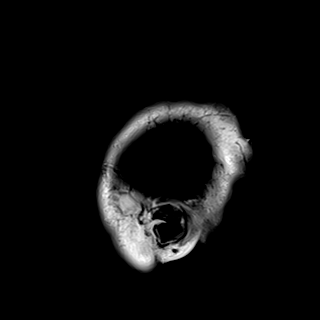

[Series 12: FLAIR · axial · 5.0mm · 0.90mm/px · z∈[-129,+13]mm · 4 of 25 slices shown]
[im 1/25]
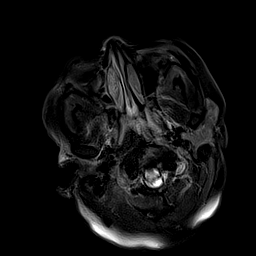
[im 9/25]
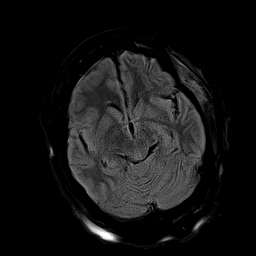
[im 17/25]
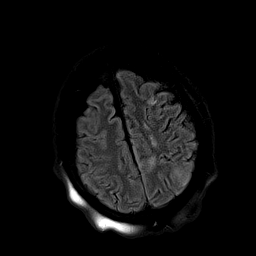
[im 25/25]
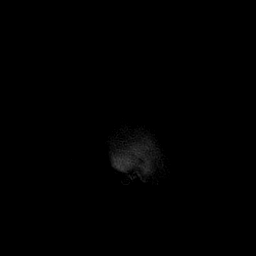

[48 of 48 positions shown; findings below may reference images not displayed]

FINDINGS: Examination was terminated early secondary to patient disposition.
Please note limited sequence acquisition limits evaluation.

Brain: Redemonstration of left frontoparietal infarcts. No new focal
restricted diffusion.

Chronic left basal ganglia lacunar insult. No midline shift or mass
lesion. No ventriculomegaly or extra-axial fluid collection.

Vascular: Please see recent CTA.

Skull and upper cervical spine: No focal osseous abnormality.

Sinuses/Orbits: Normal orbits. Clear paranasal sinuses. Trace right
mastoid effusion.

Other: Redemonstration of left frontotemporal scalp swelling, less
conspicuous than prior exams. Inhomogeneous fat suppression of the
scalp soft tissues.
IMPRESSION: Redemonstration of subacute left frontoparietal infarcts. No new
focal restricted diffusion.

Chronic left basal ganglia lacunar insult.

Decreased left frontotemporal scalp swelling.

## 2020-02-15 MED ORDER — OLANZAPINE 5 MG PO TBDP
10.0000 mg | ORAL_TABLET | Freq: Every day | ORAL | Status: DC
  Administered 2020-02-16 – 2020-03-11 (×23): 10 mg via ORAL
  Filled 2020-02-15 (×25): qty 2

## 2020-02-15 MED ORDER — ONDANSETRON 4 MG PO TBDP: 4.0000 mg | ORAL_TABLET | Freq: Four times a day (QID) | ORAL | Status: DC | PRN

## 2020-02-15 MED ORDER — METOPROLOL TARTRATE 50 MG PO TABS
100.0000 mg | ORAL_TABLET | Freq: Two times a day (BID) | ORAL | Status: DC
  Administered 2020-02-15 – 2020-03-12 (×50): 100 mg via ORAL
  Filled 2020-02-15 (×2): qty 2
  Filled 2020-02-15: qty 4
  Filled 2020-02-15 (×49): qty 2

## 2020-02-15 MED ORDER — THIAMINE HCL 100 MG PO TABS
100.0000 mg | ORAL_TABLET | Freq: Every day | ORAL | Status: DC
  Administered 2020-02-16 – 2020-03-06 (×19): 100 mg via ORAL
  Filled 2020-02-15 (×20): qty 1

## 2020-02-15 MED ORDER — METOPROLOL TARTRATE 50 MG PO TABS: 100.0000 mg | ORAL_TABLET | Freq: Two times a day (BID) | ORAL | Status: DC

## 2020-02-15 MED ORDER — CHLORDIAZEPOXIDE HCL 25 MG PO CAPS
25.0000 mg | ORAL_CAPSULE | Freq: Three times a day (TID) | ORAL | Status: DC
  Administered 2020-02-15 – 2020-02-21 (×15): 25 mg via ORAL
  Filled 2020-02-15 (×16): qty 1

## 2020-02-15 MED ORDER — BETHANECHOL CHLORIDE 25 MG PO TABS
25.0000 mg | ORAL_TABLET | Freq: Three times a day (TID) | ORAL | Status: DC
  Administered 2020-02-15 – 2020-02-17 (×5): 25 mg via ORAL
  Filled 2020-02-15 (×5): qty 1

## 2020-02-15 MED ORDER — BISACODYL 10 MG RE SUPP
10.0000 mg | Freq: Every day | RECTAL | Status: DC | PRN
  Administered 2020-02-16 – 2020-02-21 (×2): 10 mg via RECTAL
  Filled 2020-02-15 (×2): qty 1

## 2020-02-15 MED ORDER — ACETAMINOPHEN 650 MG RE SUPP
650.0000 mg | RECTAL | Status: DC | PRN
  Administered 2020-02-23: 650 mg via RECTAL
  Filled 2020-02-15: qty 1

## 2020-02-15 MED ORDER — SIMETHICONE 80 MG PO CHEW
80.0000 mg | CHEWABLE_TABLET | Freq: Four times a day (QID) | ORAL | Status: DC | PRN
  Administered 2020-02-22 – 2020-03-08 (×4): 80 mg via ORAL
  Filled 2020-02-15 (×5): qty 1

## 2020-02-15 MED ORDER — FOLIC ACID 1 MG PO TABS
1.0000 mg | ORAL_TABLET | Freq: Every day | ORAL | Status: DC
  Administered 2020-02-16 – 2020-03-06 (×19): 1 mg via ORAL
  Filled 2020-02-15 (×20): qty 1

## 2020-02-15 MED ORDER — POLYETHYLENE GLYCOL 3350 17 G PO PACK
17.0000 g | PACK | Freq: Every day | ORAL | Status: DC
  Administered 2020-02-16 – 2020-02-17 (×2): 17 g via ORAL
  Filled 2020-02-15 (×2): qty 1

## 2020-02-15 MED ORDER — ACETAMINOPHEN 325 MG PO TABS
650.0000 mg | ORAL_TABLET | Freq: Four times a day (QID) | ORAL | Status: DC | PRN
  Administered 2020-02-21 – 2020-02-22 (×3): 650 mg via ORAL
  Filled 2020-02-15 (×5): qty 2

## 2020-02-15 MED ORDER — VITAMIN D (ERGOCALCIFEROL) 1.25 MG (50000 UNIT) PO CAPS
50000.0000 [IU] | ORAL_CAPSULE | ORAL | Status: DC
  Administered 2020-02-17 – 2020-03-09 (×4): 50000 [IU] via ORAL
  Filled 2020-02-15 (×4): qty 1

## 2020-02-15 MED ORDER — ONDANSETRON HCL 4 MG/2ML IJ SOLN: 4.0000 mg | Freq: Four times a day (QID) | INTRAMUSCULAR | Status: DC | PRN

## 2020-02-15 MED ORDER — ENSURE ENLIVE PO LIQD
237.0000 mL | Freq: Three times a day (TID) | ORAL | Status: DC
  Administered 2020-02-16 – 2020-03-12 (×61): 237 mL via ORAL
  Filled 2020-02-15: qty 237

## 2020-02-15 MED ORDER — OXYCODONE HCL 5 MG PO TABS
5.0000 mg | ORAL_TABLET | ORAL | Status: DC | PRN
  Administered 2020-02-15 – 2020-02-17 (×3): 10 mg via ORAL
  Administered 2020-02-18 (×3): 5 mg via ORAL
  Administered 2020-02-19 – 2020-02-21 (×2): 10 mg via ORAL
  Administered 2020-02-22: 5 mg via ORAL
  Administered 2020-02-25 – 2020-02-26 (×2): 10 mg via ORAL
  Filled 2020-02-15 (×3): qty 2
  Filled 2020-02-15: qty 1
  Filled 2020-02-15: qty 2
  Filled 2020-02-15: qty 1
  Filled 2020-02-15: qty 2
  Filled 2020-02-15: qty 1
  Filled 2020-02-15: qty 2
  Filled 2020-02-15: qty 1
  Filled 2020-02-15 (×3): qty 2
  Filled 2020-02-15: qty 1

## 2020-02-15 MED ORDER — LORAZEPAM 2 MG/ML IJ SOLN
0.2500 mg | Freq: Four times a day (QID) | INTRAMUSCULAR | Status: DC | PRN
  Administered 2020-02-15: 0.25 mg via INTRAVENOUS
  Filled 2020-02-15: qty 1

## 2020-02-15 MED ORDER — ADULT MULTIVITAMIN W/MINERALS CH
1.0000 | ORAL_TABLET | Freq: Every day | ORAL | Status: DC
  Administered 2020-02-16 – 2020-03-12 (×25): 1 via ORAL
  Filled 2020-02-15 (×26): qty 1

## 2020-02-15 MED ORDER — MORPHINE SULFATE (PF) 2 MG/ML IV SOLN: 2.0000 mg | INTRAVENOUS | Status: DC | PRN

## 2020-02-15 MED ORDER — ASPIRIN EC 81 MG PO TBEC
81.0000 mg | DELAYED_RELEASE_TABLET | Freq: Every day | ORAL | Status: DC
  Administered 2020-02-16 – 2020-03-12 (×26): 81 mg via ORAL
  Filled 2020-02-15 (×26): qty 1

## 2020-02-15 MED ORDER — VITAMIN D 25 MCG (1000 UNIT) PO TABS
2000.0000 [IU] | ORAL_TABLET | Freq: Two times a day (BID) | ORAL | Status: DC
  Administered 2020-02-15 – 2020-03-12 (×50): 2000 [IU] via ORAL
  Filled 2020-02-15 (×52): qty 2

## 2020-02-15 MED ORDER — DOCUSATE SODIUM 100 MG PO CAPS
100.0000 mg | ORAL_CAPSULE | Freq: Two times a day (BID) | ORAL | Status: DC
  Administered 2020-02-15 – 2020-02-17 (×4): 100 mg via ORAL
  Filled 2020-02-15 (×4): qty 1

## 2020-02-15 MED ORDER — APIXABAN 5 MG PO TABS
5.0000 mg | ORAL_TABLET | Freq: Two times a day (BID) | ORAL | Status: DC
  Administered 2020-02-15 – 2020-03-12 (×52): 5 mg via ORAL
  Filled 2020-02-15 (×7): qty 1
  Filled 2020-02-15: qty 2
  Filled 2020-02-15 (×44): qty 1

## 2020-02-15 MED ORDER — METHOCARBAMOL 500 MG PO TABS
1000.0000 mg | ORAL_TABLET | Freq: Four times a day (QID) | ORAL | Status: DC
  Administered 2020-02-15 – 2020-03-05 (×66): 1000 mg via ORAL
  Filled 2020-02-15 (×73): qty 2

## 2020-02-15 MED ORDER — BISACODYL 10 MG RE SUPP: 10.0000 mg | Freq: Every day | RECTAL | Status: DC | PRN

## 2020-02-15 MED ORDER — LACTULOSE 10 GM/15ML PO SOLN
20.0000 g | Freq: Two times a day (BID) | ORAL | Status: DC
  Administered 2020-02-15 – 2020-02-21 (×12): 20 g via ORAL
  Filled 2020-02-15 (×12): qty 30

## 2020-02-15 MED ORDER — LORAZEPAM 2 MG/ML IJ SOLN: 0.2500 mg | Freq: Four times a day (QID) | INTRAMUSCULAR | Status: DC | PRN
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
## 2020-02-15 NOTE — Progress Notes (Signed)
Inpatient Rehabilitation-Admissions Coordinator   Met with pt bedside. Notified him of bed offer. Reviewed CIR program details, expectations, ELOS, anticipated outcomes and estimated daily cost of care. He is in agreement. Reviewed same information with pt's grandmother via phone and she has provided consent. (forms provided and signed). Both want to pursue CIR at this time.   Received medical clearance from attending service for admit to CIR today. AC has notified TOC team and RN of plan for admit today. Please call if questions.   Raechel Ache, OTR/L  Rehab Admissions Coordinator  [phone] 02/15/2020 1:50 PM
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
## 2020-02-15 NOTE — TOC Transition Note (Signed)
Transition of Care [facility]) - CM/SW Discharge Note   Patient Details  Name: Christopher Marks MRN: [id number] Date of Birth: [birth year]  Transition of Care [facility] CM/SW Contact:  Glennon Mac, RN Phone Number: 02/15/2020, 3:29 PM   Clinical Narrative:  Pt medically stable for discharge and has been accepted for admission to [facility] IP Rehab today.  Plan dc to CIR upon bed availability.      Final next level of care: IP Rehab Facility Barriers to Discharge: Barriers Resolved   Patient Goals and CMS Choice   CMS Medicare.gov Compare Post Acute Care list provided to:: Patient                          Discharge Plan and Services   Discharge Planning Services: CM Consult Post Acute Care Choice: IP Rehab                               Social Determinants of Health (SDOH) Interventions     Readmission Risk Interventions No flowsheet data found.  Quintella Baton, RN, BSN  Trauma/Neuro ICU Case Manager [phone]
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
## 2020-02-15 NOTE — Progress Notes (Signed)
Inpatient Rehabilitation  Patient information reviewed and entered into eRehab system by Maalle Starrett M. Heydy Montilla, M.A., CCC/SLP, PPS Coordinator.  Information including medical coding, functional ability and quality indicators will be reviewed and updated through discharge.    

## 2020-02-15 NOTE — Progress Notes (Signed)
Tcd bubble study has been completed.   Preliminary results in CV Proc.   Blanch Media 02/15/2020 3:33 PM

## 2020-02-15 NOTE — Progress Notes (Signed)
Inpatient Rehabilitation Medication Review by a Pharmacist  A complete drug regimen review was completed for this patient to identify any potential clinically significant medication issues.  Clinically significant medication issues were identified:  no  Check AMION for pharmacist assigned to patient if future medication questions/issues arise during this admission.  Pharmacist comments: No meds PTA  Time spent performing this drug regimen review (minutes):  12   Squire Withey, Judie Bonus 02/15/2020 8:11 PM

## 2020-02-16 ENCOUNTER — Inpatient Hospital Stay (HOSPITAL_COMMUNITY): Payer: Self-pay | Admitting: Speech Pathology

## 2020-02-16 ENCOUNTER — Inpatient Hospital Stay (HOSPITAL_COMMUNITY): Payer: Self-pay

## 2020-02-16 DIAGNOSIS — S36113S Laceration of liver, unspecified degree, sequela: Secondary | ICD-10-CM

## 2020-02-16 DIAGNOSIS — S82831S Other fracture of upper and lower end of right fibula, sequela: Secondary | ICD-10-CM

## 2020-02-16 DIAGNOSIS — S82101S Unspecified fracture of upper end of right tibia, sequela: Secondary | ICD-10-CM

## 2020-02-16 DIAGNOSIS — I69391 Dysphagia following cerebral infarction: Secondary | ICD-10-CM

## 2020-02-16 DIAGNOSIS — I639 Cerebral infarction, unspecified: Secondary | ICD-10-CM

## 2020-02-16 LAB — COMPREHENSIVE METABOLIC PANEL
ALT: 359 U/L — ABNORMAL HIGH (ref 0–44)
AST: 264 U/L — ABNORMAL HIGH (ref 15–41)
Albumin: 2.7 g/dL — ABNORMAL LOW (ref 3.5–5.0)
Alkaline Phosphatase: 207 U/L — ABNORMAL HIGH (ref 38–126)
Anion gap: 12 (ref 5–15)
BUN: 22 mg/dL — ABNORMAL HIGH (ref 6–20)
CO2: 20 mmol/L — ABNORMAL LOW (ref 22–32)
Calcium: 9.3 mg/dL (ref 8.9–10.3)
Chloride: 106 mmol/L (ref 98–111)
Creatinine, Ser: 1.16 mg/dL (ref 0.61–1.24)
GFR, Estimated: >60 mL/min (ref >60)
Glucose, Bld: 106 mg/dL — ABNORMAL HIGH (ref 70–99)
Potassium: 4.4 mmol/L (ref 3.5–5.1)
Sodium: 138 mmol/L (ref 135–145)
Total Bilirubin: 1.7 mg/dL — ABNORMAL HIGH (ref 0.3–1.2)
Total Protein: 8.1 g/dL (ref 6.5–8.1)

## 2020-02-16 LAB — CBC WITH DIFFERENTIAL/PLATELET
Abs Immature Granulocytes: 0.24 10*3/uL — ABNORMAL HIGH (ref 0.00–0.07)
Basophils Absolute: 0.1 10*3/uL (ref 0.0–0.1)
Basophils Relative: 1 %
Eosinophils Absolute: 0.1 10*3/uL (ref 0.0–0.5)
Eosinophils Relative: 1 %
HCT: 29 % — ABNORMAL LOW (ref 39.0–52.0)
Hemoglobin: 9.1 g/dL — ABNORMAL LOW (ref 13.0–17.0)
Immature Granulocytes: 2 %
Lymphocytes Relative: 17 %
Lymphs Abs: 2.2 10*3/uL (ref 0.7–4.0)
MCH: 26.4 pg (ref 26.0–34.0)
MCHC: 31.4 g/dL (ref 30.0–36.0)
MCV: 84.1 fL (ref 80.0–100.0)
Monocytes Absolute: 1.5 10*3/uL — ABNORMAL HIGH (ref 0.1–1.0)
Monocytes Relative: 12 %
Neutro Abs: 9 10*3/uL — ABNORMAL HIGH (ref 1.7–7.7)
Neutrophils Relative %: 67 %
Platelets: 403 10*3/uL — ABNORMAL HIGH (ref 150–400)
RBC: 3.45 MIL/uL — ABNORMAL LOW (ref 4.22–5.81)
RDW: 14.6 % (ref 11.5–15.5)
WBC: 13.1 10*3/uL — ABNORMAL HIGH (ref 4.0–10.5)
nRBC: 0.2 % (ref 0.0–0.2)

## 2020-02-16 MED ORDER — LIDOCAINE HCL URETHRAL/MUCOSAL 2 % EX GEL
1.0000 "application " | CUTANEOUS | Status: DC | PRN
  Filled 2020-02-16: qty 5
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
## 2020-02-16 NOTE — Progress Notes (Signed)
Patient ID: Christopher Marks, male   DOB: Jan 26, 1991, 29 y.o.   MRN: [id number]  SW made efforts to meet with pt to complete assessment. Pt was drifting in/out of sleep at time of visit. SW will continue to make efforts to complete assessment.   [name], MSW, LCSWA Office: [phone] Cell: [phone] Fax: [phone]
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
## 2020-02-17 ENCOUNTER — Inpatient Hospital Stay (HOSPITAL_COMMUNITY): Payer: Self-pay | Admitting: Occupational Therapy

## 2020-02-17 ENCOUNTER — Inpatient Hospital Stay (HOSPITAL_COMMUNITY): Payer: Self-pay | Admitting: Physical Therapy

## 2020-02-17 ENCOUNTER — Inpatient Hospital Stay (HOSPITAL_COMMUNITY): Payer: Self-pay | Admitting: Speech Pathology

## 2020-02-17 MED ORDER — SENNOSIDES-DOCUSATE SODIUM 8.6-50 MG PO TABS
2.0000 | ORAL_TABLET | Freq: Every day | ORAL | Status: DC
  Administered 2020-02-18 – 2020-03-04 (×16): 2 via ORAL
  Filled 2020-02-17 (×15): qty 2

## 2020-02-17 MED ORDER — POLYETHYLENE GLYCOL 3350 17 G PO PACK
17.0000 g | PACK | Freq: Every day | ORAL | Status: DC | PRN
  Administered 2020-03-01 – 2020-03-02 (×2): 17 g via ORAL
  Filled 2020-02-17 (×2): qty 1

## 2020-02-17 MED ORDER — DILTIAZEM HCL ER COATED BEADS 120 MG PO CP24
120.0000 mg | ORAL_CAPSULE | Freq: Every day | ORAL | Status: DC
  Administered 2020-02-17 – 2020-02-20 (×4): 120 mg via ORAL
  Filled 2020-02-17 (×4): qty 1

## 2020-02-17 MED ORDER — BETHANECHOL CHLORIDE 25 MG PO TABS
50.0000 mg | ORAL_TABLET | Freq: Three times a day (TID) | ORAL | Status: DC
  Administered 2020-02-17 – 2020-03-06 (×52): 50 mg via ORAL
  Filled 2020-02-17 (×53): qty 2

## 2020-02-17 NOTE — Progress Notes (Signed)
Speech Language Pathology Daily Session Note  Patient Details  Name: Christopher Marks MRN: [id number] Date of Birth: 1990-06-06  Today's Date: 02/17/2020 SLP Individual Time: 1000-1020 SLP Individual Time Calculation (min): 20 min and Today's Date: 02/17/2020 SLP Missed Time: 40 Minutes Missed Time Reason: Patient fatigue;Pain  Short Term Goals: Week 1: SLP Short Term Goal 1 (Week 1): Patient will participate in full cognitive-linguistic testing to determine baseline scores. SLP Short Term Goal 2 (Week 1): Patient will perform basic functional ADL"s with modA for accuracy and to initiate. SLP Short Term Goal 3 (Week 1): Patient will consume trials of regular texture solids with SLP without significant difficulty or overt s/s of aspiration or penetration. SLP Short Term Goal 4 (Week 1): Patient will orient self to time/place/situation with minA cues for use of external aids as needed. SLP Short Term Goal 5 (Week 1): Patient will express wants/needs/thoughts at sentence and conversational level with 90% intelligibility of speech with supervision A.  Skilled Therapeutic Interventions: Skilled treatment session focused on cognitive goals. Upon arrival, patient was asleep in bed. SLP facilitated session by providing more than a reasonable amount of time as well as verbal and tactile cues for arousal. Patient would not open his eyes and would only intermittently yell out in pain. RN aware and reported pain in his neck and that he was pre-medicated which was also likely contributing to fatigue and lethargy. Patient attempted to verbalize X 1 but it was essentially unintelligible. Due to pain and fatigue, patient missed remaining 40 minutes of session. Patient left supine in bed with alarm on and all needs within reach. Continue with current plan of care.      Pain Yelling out in pain, patient pre-medicated, RN aware  Therapy/Group: Individual Therapy  Kirah Stice 02/17/2020, 3:01 PM

## 2020-02-17 NOTE — Progress Notes (Signed)
   02/16/20 1545  Assess: MEWS Score  Temp 98.8 F (37.1 C)  BP (!) 146/98  Pulse Rate (!) 107  Resp (!) 22  SpO2 99 %  O2 Device Room Air  Assess: MEWS Score  MEWS Temp 0  MEWS Systolic 0  MEWS Pulse 1  MEWS RR 1  MEWS LOC 0  MEWS Score 2  MEWS Score Color Yellow  Assess: if the MEWS score is Yellow or Red  Were vital signs taken at a resting state? Yes  Focused Assessment No change from prior assessment  Early Detection of Sepsis Score *See Row Information* Low  MEWS guidelines implemented *See Row Information* Yes  Treat  MEWS Interventions Other (Comment) (assessed pain)  Pain Scale Faces  Faces Pain Scale 2  Pain Type Acute pain  Pain Location Back  Pain Orientation Lower  Pain Descriptors / Indicators Aching;Discomfort  Pain Frequency Constant  Pain Onset On-going  Pain Intervention(s) Medication (See eMAR);Repositioned  Multiple Pain Sites No  Facial Expression 0  Body Movements 0  Muscle Tension 0  Vocalization (extubated pts.) 1  Take Vital Signs  Increase Vital Sign Frequency  Yellow: Q 2hr X 2 then Q 4hr X 2, if remains yellow, continue Q 4hrs  Escalate  MEWS: Escalate Yellow: discuss with charge nurse/RN and consider discussing with provider and RRT  Notify: Charge Nurse/RN  Name of Charge Nurse/RN Notified Debra  Date Charge Nurse/RN Notified 02/16/20  Time Charge Nurse/RN Notified 1545  Document  Patient Outcome Stabilized after interventions

## 2020-02-17 NOTE — Progress Notes (Signed)
Patient ID: Christopher Marks, male   DOB: [birth year], 29 y.o.   MRN: [id number]  SW received court order from [facility] Dept requesting d/c to their facilities at time of d/c- [facility].   [name], MSW, LCSWA Office: [phone] Cell: [phone] Fax: [phone]

## 2020-02-17 NOTE — Care Management (Signed)
[facility] Individual Statement of Services  Patient Name:  Christopher Marks  Date:  02/17/2020  Welcome to the [facility].  Our goal is to provide you with an individualized program based on your diagnosis and situation, designed to meet your specific needs.  With this comprehensive rehabilitation program, you will be expected to participate in at least 3 hours of rehabilitation therapies Monday-Friday, with modified therapy programming on the weekends.  Your rehabilitation program will include the following services:  Physical Therapy (PT), Occupational Therapy (OT), Speech Therapy (ST), 24 hour per day rehabilitation nursing, Therapeutic Recreaction (TR), Psychology, Neuropsychology, Care Coordinator, Rehabilitation Medicine, Nutrition Services, Pharmacy Services and Other  Weekly team conferences will be held on Tuesdays to discuss your progress.  Your Inpatient Rehabilitation Care Coordinator will talk with you frequently to get your input and to update you on team discussions.  Team conferences with you and your family in attendance may also be held.  Expected length of stay: 3-4 weeks   Overall anticipated outcome: Minimal Assistance  Depending on your progress and recovery, your program may change. Your Inpatient Rehabilitation Care Coordinator will coordinate services and will keep you informed of any changes. Your Inpatient Rehabilitation Care Coordinator's name and contact numbers are listed  below.  The following services may also be recommended but are not provided by the [facility]:   Driving Evaluations  Home Health Rehabiltiation Services  Outpatient Rehabilitation Services  Vocational Rehabilitation   Arrangements will be made to provide these services after discharge if needed.  Arrangements include referral to agencies that provide these services.  Your insurance has been verified to be:  Uninsured  Your  primary doctor is:  No PCP  Pertinent information will be shared with your doctor and your insurance company.  Inpatient Rehabilitation Care Coordinator:  Susie Cassette [phone] or [phone]  Information discussed with and copy given to patient by: Gretchen Short, 02/17/2020, 10:39 AM
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
## 2020-02-17 NOTE — IPOC Note (Signed)
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  Hospital Problems: Principal Problem:   Ischemic cerebrovascular accident (CVA) of frontal lobe (HCC)     Functional Problem List: Nursing Behavior, Bladder, Bowel, Endurance, Medication Management, Motor, Pain, Perception, Safety, Skin Integrity  PT Balance, Pain, Behavior, Perception, Edema, Safety, Endurance, Sensory, Motor, Skin Integrity, Nutrition  OT Balance, Cognition, Behavior, Edema, Endurance, Motor, Nutrition, Pain, Perception, Sensory, Safety, Skin Integrity, Vision  SLP Behavior, Perception, Safety, Cognition, Nutrition  TR         Basic ADL's: OT Eating, Grooming, Bathing, Dressing     Advanced  ADL's: OT       Transfers: PT Bed Mobility, Bed to Chair, Car, [employer], Civil Service fast streamer, Research scientist (life sciences): PT Ambulation, Psychologist, prison and probation services, Stairs     Additional Impairments: OT Fuctional Use of Upper Extremity  SLP Swallowing, Social Cognition, Communication expression Social Interaction, Problem Solving, Memory, Attention  TR      Anticipated Outcomes Item Anticipated Outcome  Self Feeding S  Swallowing  mod I regular solids, thin liquids   Basic self-care  S grooming; MIN A UB dressing  Toileting  MIN A   Bathroom Transfers MIN A  Bowel/Bladder  mod assist  Transfers  min A using LRAD  Locomotion     Communication  supervision complex, mod I basic level communication/comprehension  Cognition  supervision safety, complex problem solving  Pain  <4 on a 0-10 pain scale  Safety/Judgment  mod assist and call for assistance   Therapy Plan: PT Intensity: Minimum of 1-2 x/day ,45 to 90 minutes PT Frequency: 5 out of 7 days PT Duration Estimated Length of Stay: 3-4 weeks OT Intensity: Minimum of 1-2 x/day, 45 to 90 minutes OT  Frequency: 5 out of 7 days OT Duration/Estimated Length of Stay: 3-4 weeks SLP Intensity: Minumum of 1-2 x/day, 30 to 90 minutes SLP Frequency: 3 to 5 out of 7 days SLP Duration/Estimated Length of Stay: 2 weeks   Due to the current state of emergency, patients may not be receiving their 3-hours of Medicare-mandated therapy.   Team Interventions: Nursing Interventions Patient/Family Education, Bladder Management, Bowel Management, Pain Management, Medication Management, Skin Care/Wound Management, Discharge Planning, Psychosocial Support  PT interventions Ambulation/gait training, Cognitive remediation/compensation, Discharge planning, DME/adaptive equipment instruction, Pain management, Functional mobility training, Psychosocial support, Splinting/orthotics, Therapeutic Activities, UE/LE Strength taining/ROM, Visual/perceptual remediation/compensation, Warden/ranger, Community reintegration, Disease management/prevention, Functional electrical stimulation, Neuromuscular re-education, Patient/family education, Skin care/wound management, Stair training, Therapeutic Exercise, UE/LE Coordination activities, Wheelchair propulsion/positioning  OT Interventions Warden/ranger, Fish farm manager, Patient/family education, Therapeutic Activities, Wheelchair propulsion/positioning, Therapeutic Exercise, Functional electrical stimulation, Psychosocial support, Cognitive remediation/compensation, Community reintegration, Functional mobility training, Self Care/advanced ADL retraining, UE/LE Strength taining/ROM, UE/LE Coordination activities, Skin care/wound managment, Neuromuscular re-education, Discharge planning, Pain management, Disease mangement/prevention, Splinting/orthotics, Visual/perceptual remediation/compensation  SLP Interventions Cognitive remediation/compensation, Dysphagia/aspiration precaution training, Cueing hierarchy, Internal/external aids,  Speech/Language facilitation, Functional tasks, Patient/family education, Medication managment, Environmental controls  TR Interventions    SW/CM Interventions     Barriers to Discharge MD  Medical stability  Nursing Inaccessible home environment, Decreased caregiver support, Home environment access/layout, Incontinence, Wound Care, Lack of/limited family support, Weight bearing restrictions, Medication compliance, Behavior    PT Inaccessible home environment, Decreased caregiver support, Wound Care, Weight bearing restrictions, Behavior    OT Decreased caregiver support    SLP Decreased  caregiver support    SW       Team Discharge Planning: Destination: PT-Home ,OT- Home , SLP-Home Projected Follow-up: PT-Home health PT, OT-  Home health OT, SLP-Home Health SLP, 24 hour supervision/assistance Projected Equipment Needs: PT-To be determined, OT- To be determined, SLP-None recommended by SLP Equipment Details: PT-TBD based on patient's progress, OT-  Patient/family involved in discharge planning: PT- Patient,  OT-Patient, SLP-Patient unable/family or caregive not available  MD ELOS: 21-28 days Medical Rehab Prognosis:  Excellent Assessment: The patient has been admitted for CIR therapies with the diagnosis of right frontal CVA after polytrauma. The team will be addressing functional mobility, strength, stamina, balance, safety, adaptive techniques and equipment, self-care, bowel and bladder mgt, patient and caregiver education, pain mgt, ortho precautions, speech, swallowing, behavioral mgt. Goals have been set at min assist for basic self-care and transfers and supervision for communication and cognition.   Due to the current state of emergency, patients may not be receiving their 3 hours per day of Medicare-mandated therapy.    Ranelle Oyster, MD, FAAPMR      See Team Conference Notes for weekly updates to the plan of care

## 2020-02-17 NOTE — Progress Notes (Signed)
Physical Therapy Session Note  Patient Details  Name: Christopher Marks MRN: [id number] Date of Birth: [birth year]  Today's Date: 02/17/2020 PT Individual Time: 1829-9371 PT Individual Time Calculation (min): 58 min   Short Term Goals: Week 1:  PT Short Term Goal 1 (Week 1): Patient will perform bed mobility with max A +1. PT Short Term Goal 2 (Week 1): Patient will perform basic transfers with mod A +2. PT Short Term Goal 3 (Week 1): Patient will initiate w/c mobility. PT Short Term Goal 4 (Week 1): Patient will achieve neutral cervical rotation with AROM.  Skilled Therapeutic Interventions/Progress Updates:    Pt received supine, asleep in bed and awakens to auditory stimulus though remains lethargic keeping eyes shut. Pt mumbling at beginning of session difficult to understand but with increased alertness is comprehensible. Pt agreeable to therapy session. Supine: HR 101bpm and SpO2 100% on RA. Pt with fluctuating orientation able to state correct location and situation but then during session will have sudden changes in topic of conversation stating "Yall done turned me into a fish" with therapist unable to understand meaning behind this phrase or "Yall got that [place] ice cream."  Pt will perseverate on certain topics but with increased cuing is able to be redirected. Pt requires increased time to initiate a task after being cued often counting to 5 (instead of 3) and then still may take a second to start moving. Continues to demo head position into R lateral flexion and R cervical rotation with therapist cuing throughout for improved midline orientation - able to perform more in supine but unable to hold head up in midline when sitting EOB. Supine>sitting L EOB, HOB slightly elevated and using bedrail, via logroll technique to increase pt participation and independence with total assist for trunk upright and B LE management - max sequential cuing and allowing increased time for pt initiation.  Tolerated sitting EOB ~64minutes starting with heavy min assist then progressed to max assist due to fatigue with worsening R lateral trunk lean and cervical rotation/flexion - therapist facilitating R UE WBing position while in sitting for NMR and provided max cuing for trunk midline orientation. Sit>supine via reverse logroll with max assist for trunk descent and B LE management into the bed - cuing and facilitation for using L UE to support trunk. During supine rest break focused on active L cervical rotation and L attention then returned to sitting as described above. Therapist reinforced education on R LE NWB precautions & utilized therapist's foot to keep pt from [place] during transfers. L lateral scoot transfer EOB>TIS w/c using transfer board with max assist for sitting balance while placing board and total assist for scooting - pt's initiation improving with him continuing to count to improve his timing of muscle activation and participation with mobility task. Transported to main therapy gym in w/c. Sitting in TIS w/c performed L cervical rotation and R UE NMR task via shooting basketball (light ball with very close proximity to hoop) and therapist providing max facilitation at R UE for increased shoulder flexion - pt able to initiate grasping ball with R hand, required facilitation for improved position. Transported back to room and performed L lateral scoot transfer as described above w/c>EOB though requires increased assist at trunk to maintain balance once on bed due to no back support. Sit>supine with max assist as described above. Pt left supine in bed with needs in reach, towel roll placed for improved cervical alignment in midline,  RUE supported on pillows, and  towels/pillows positioned to improve R LE alignment avoiding excessive external rotation. Left with needs in reach and bed alarm on.  Therapy Documentation Precautions:  Precautions Precautions: Fall Precaution Comments: R-sided  hemi Restrictions Weight Bearing Restrictions: Yes RLE Weight Bearing: Non weight bearing  Pain: Reports pain with movement of R LE but unable to clarify further - does not appear to continue once movement stops - provided rest breaks, repositioning, distraction, and emotional support for pain management.   Therapy/Group: Individual Therapy  Ginny Forth , PT, DPT, CSRS  02/17/2020, 12:31 PM
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
## 2020-02-19 ENCOUNTER — Inpatient Hospital Stay (HOSPITAL_COMMUNITY): Payer: Self-pay

## 2020-02-19 ENCOUNTER — Inpatient Hospital Stay (HOSPITAL_COMMUNITY): Payer: Self-pay | Admitting: Speech Pathology

## 2020-02-19 MED ORDER — AMLODIPINE BESYLATE 2.5 MG PO TABS
2.5000 mg | ORAL_TABLET | Freq: Every day | ORAL | Status: DC
  Administered 2020-02-19 – 2020-02-20 (×2): 2.5 mg via ORAL
  Filled 2020-02-19 (×2): qty 1
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
## 2020-02-19 NOTE — Progress Notes (Signed)
Bladder scan showing 592 ml. Pt was able to void a total of 600 ml of clear amber colored urine. PVR: 92 ml.

## 2020-02-19 NOTE — Progress Notes (Signed)
Physical Therapy Session Note  Patient Details  Name: Christopher Marks MRN: [id number] Date of Birth: [birth year]  Today's Date: 02/19/2020 PT Individual Time: 0950-1100 and 1400-1445 PT Individual Time Calculation (min): 70 min and 45 min   Short Term Goals: Week 1:  PT Short Term Goal 1 (Week 1): Patient will perform bed mobility with max A +1. PT Short Term Goal 2 (Week 1): Patient will perform basic transfers with mod A +2. PT Short Term Goal 3 (Week 1): Patient will initiate w/c mobility. PT Short Term Goal 4 (Week 1): Patient will achieve neutral cervical rotation with AROM.  Skilled Therapeutic Interventions/Progress Updates:     Session 1: Patient in bed asleep with R cervical tilt and rotation upon PT arrival. Patient aroused to verbal and tactile stimulation and remained lethargic when lying in the bed, but agreeable to PT session. Patient reported cervical and head pain during session, RN made aware. PT provided repositioning, rest breaks, and distraction as pain interventions throughout session.   Therapeutic Activity: Bed Mobility: Patient performed rolling R/L to don pants and supine to sit with mod-max A. Provided verbal cues for bending opposite leg and reaching with opposite arm to roll, rolling to the R then setting his R elbow to push up from side-lying, required total A for R leg management throughout. Transfers: Patient performed a slide board transfer bed>TIS w/c with max A and total A for board placement. Provided cues for hand placement, board placement, and head-hips relationship for proper technique and decreased assist with transfers. Placed R foot on PT's foot in extended position to maintain NWB during the transfer. Patient unable to recall weight bearing precautions during session, provided education throughout.   Neuromuscular Re-ed: Patient performed the following sitting balance and L lower extremity motor control activities: -L knee and hip flexion/extension  2x8, tolerated flexion >90 degrees today with facilitation for hamstring and hip flexor activation progressing from PROM to AAROM -sitting balance EOB x4 min progressing from mod A-CGA focused on postural control, trunk and cervical elongation, and weight bearing through R hand with multimodal cues and increased response with external cues throughout  Manual Therapy: Patient performed the following exercises with verbal and tactile cues for proper technique. -cervical extension, L lateral flexion, and L rotation stretch with soft tissue mobilization to R scalenes, splenius capitus, and upper trap -L DF stretch with gentile over pressure at the ball of the foot  2x30 sec, noted DF ROM lacking ~5 deg from neutral   Patient in TIS w/c at end of session with breaks locked, seat belt alarm set, and all needs within reach. Retrieved new head rest and required increased time for positioning patient in the chair to reduce R cervical flexion/rotation and improved lower extremity positioning. Patient focused on midline orientation of head positioning in the chair and L cervical rotation following cues from PT during positioning.   Assessed patient's positioning and sitting tolerance 1 hour after session. Patient with increased L cervical rotation and tilt. Repositioning head rest and increased tilt in the TIS w/c. Patient tolerating sitting well and in agreement to remain in the TIS w/c until PT at 2pm.   Session 2: Patient in TIS w/c upon PT arrival. Patient alert and agreeable to PT session. Patient reported headache and cervical pain during session, RN made aware. PT provided repositioning, rest breaks, and distraction as pain interventions throughout session.   Therapeutic Activity: Bed Mobility: Patient performed sit to supine with mod A for lower extremity managment.  Provided verbal cues for performing sitting to R side-lying for improved trunk control with his R arm. Transfers: Demonstrated slide board  transfers for patient on the mat table. Attempted blocked transfer training TIS<>mat table, however, after initiating first transfer, patient crying out in pain in his head and neck and requesting to stop transfer training and to return to the bed. Patient performed a slide board transfer TIS w/c>bed with max-total A and total A for board placement. Provided cues for hand placement, board placement, and head-hips relationship for proper technique and decreased assist with transfers.   Wheelchair Mobility:  Patient was transported in the TIS w/c with total A throughout session for energy conservation and time management.  Neuromuscular Re-ed: Patient performed the following R lower extremity motor control activities: -knee extension/flexion 2x6 in sitting progressing from PROM to [facility] focused on full concentric muscle activation throughout full available range  Manual Therapy: -cervical extension, L lateral flexion, and L rotation stretch with soft tissue mobilization to R scalenes, splenius capitus, and upper trap in sitting for pain management and increased cervical ROM  Patient in bed at end of session with breaks locked, bed alarm set, and all needs within reach. Educated patient on body positioning in the bed. Placed 1 pillow with a towel roll on the L under patient's head, placed patient in neutral alignment. Placed 2 pillows under patient's R arm for support for the glenohumeral joint and edema control, and placed 1 pillow floating patient's R heel for edema control and pressure relief. Patient reported reduced pain and increased comfort with positioning in the bed.    Therapy Documentation Precautions:  Precautions Precautions: Fall Precaution Comments: R-sided hemi Restrictions Weight Bearing Restrictions: Yes RLE Weight Bearing: Non weight bearing   Therapy/Group: Individual Therapy  Jenascia Bumpass L Ayden Apodaca PT, DPT  02/19/2020, 3:58 PM

## 2020-02-19 NOTE — Progress Notes (Signed)
Occupational Therapy Session Note  Patient Details  Name: Christopher Marks MRN: [id number] Date of Birth: 1990-10-25  Today's Date: 02/19/2020 OT Individual Time: 0630-1601 OT Individual Time Calculation (min): 60 min    Short Term Goals: Week 1:  OT Short Term Goal 1 (Week 1): Pt will sit EOB wiht MOD A of 1 caregiver in prep for seated toileitng OT Short Term Goal 2 (Week 1): Pt will transfer wiht 1 caregiver and LRAD to w/c in prep for toilet transfer OT Short Term Goal 3 (Week 1): Pt will complete 2/4 steps of UB dressing OT Short Term Goal 4 (Week 1): Pt will locate 2/3 ADL items R of midline at sink  Skilled Therapeutic Interventions/Progress Updates:    Pt received supine with c/o neck and back pain. Pt was repositioned with mod A to be more upright in bed and for better spinal alignment (pt slouched over). Pt's breakfast tray was obtained and set up for him. HOB elevated to 53 degrees and distractions minimized. Pt was able to self-feed breakfast with min A for occasional assist loading fork. Pt required frequent cueing for slower rate, bite sized management and slight R pocketing. Pt oriented x4 but stating he didn't know he had a stroke. Pt requiring cueing to incorporate his R arm into ADLs tasks- pt reporting he is more or less ambidextrous, no family present to confirm but pt doing well using L arm to feed breakfast. Pt able to wash anterior peri area with his R arm, slow and laborious with apraxic movements. Pt rolled to the R with mod A for brief change. He was able to wash his bottom with min A for maintaining sidelying. Brief was donned total A at bed level. Pt reporting pain throughout session with movement and was repositioned and given rest breaks throughout. A towel roll was placed along pt's neck on the R side to promotme more midline orientation of his head 2/2 R lateral neck flexion at rest. Pt was left supine with all needs met, bed alarm set. Pt assisted in dialing phone to  call his grandma (number found in EMR, pt unable to recall).   Therapy Documentation Precautions:  Precautions Precautions: Fall Precaution Comments: R-sided hemi Restrictions Weight Bearing Restrictions: Yes RLE Weight Bearing: Non weight bearing   Therapy/Group: Individual Therapy  Curtis Sites 02/19/2020, 7:22 AM

## 2020-02-20 ENCOUNTER — Inpatient Hospital Stay (HOSPITAL_COMMUNITY): Payer: Self-pay

## 2020-02-20 ENCOUNTER — Inpatient Hospital Stay (HOSPITAL_COMMUNITY): Payer: Self-pay | Admitting: Speech Pathology

## 2020-02-20 ENCOUNTER — Inpatient Hospital Stay (HOSPITAL_COMMUNITY): Payer: Self-pay | Admitting: Occupational Therapy

## 2020-02-20 LAB — COMPREHENSIVE METABOLIC PANEL
ALT: 330 U/L — ABNORMAL HIGH (ref 0–44)
AST: 148 U/L — ABNORMAL HIGH (ref 15–41)
Albumin: 2.7 g/dL — ABNORMAL LOW (ref 3.5–5.0)
Alkaline Phosphatase: 188 U/L — ABNORMAL HIGH (ref 38–126)
Anion gap: 15 (ref 5–15)
BUN: 12 mg/dL (ref 6–20)
CO2: 18 mmol/L — ABNORMAL LOW (ref 22–32)
Calcium: 9 mg/dL (ref 8.9–10.3)
Chloride: 100 mmol/L (ref 98–111)
Creatinine, Ser: 0.78 mg/dL (ref 0.61–1.24)
GFR, Estimated: >60 mL/min (ref >60)
Glucose, Bld: 94 mg/dL (ref 70–99)
Potassium: 5.2 mmol/L — ABNORMAL HIGH (ref 3.5–5.1)
Sodium: 133 mmol/L — ABNORMAL LOW (ref 135–145)
Total Bilirubin: 1.2 mg/dL (ref 0.3–1.2)
Total Protein: 7.4 g/dL (ref 6.5–8.1)

## 2020-02-20 LAB — CBC
HCT: 32.9 % — ABNORMAL LOW (ref 39.0–52.0)
Hemoglobin: 10.5 g/dL — ABNORMAL LOW (ref 13.0–17.0)
MCH: 26.5 pg (ref 26.0–34.0)
MCHC: 31.9 g/dL (ref 30.0–36.0)
MCV: 83.1 fL (ref 80.0–100.0)
Platelets: 607 10*3/uL — ABNORMAL HIGH (ref 150–400)
RBC: 3.96 MIL/uL — ABNORMAL LOW (ref 4.22–5.81)
RDW: 15.3 % (ref 11.5–15.5)
WBC: 16.2 10*3/uL — ABNORMAL HIGH (ref 4.0–10.5)
nRBC: 0 % (ref 0.0–0.2)

## 2020-02-20 MED ORDER — DILTIAZEM HCL ER COATED BEADS 180 MG PO CP24
180.0000 mg | ORAL_CAPSULE | Freq: Every day | ORAL | Status: DC
  Administered 2020-02-21 – 2020-02-25 (×5): 180 mg via ORAL
  Filled 2020-02-20 (×6): qty 1

## 2020-02-20 NOTE — Progress Notes (Signed)
Pt requiring encouragement to void this am, after bladder scan of , pt was able to void 350 ml after consuming some fluids.

## 2020-02-20 NOTE — Progress Notes (Addendum)
Occupational Therapy Session Note  Patient Details  Name: Christopher Marks MRN: [id number] Date of Birth: [birth year]  Today's Date: 02/20/2020 OT Individual Time: 1300-1353 OT Individual Time Calculation (min): 53 min    Short Term Goals: Week 1:  OT Short Term Goal 1 (Week 1): Pt will sit EOB wiht MOD A of 1 caregiver in prep for seated toileitng OT Short Term Goal 2 (Week 1): Pt will transfer wiht 1 caregiver and LRAD to w/c in prep for toilet transfer OT Short Term Goal 3 (Week 1): Pt will complete 2/4 steps of UB dressing OT Short Term Goal 4 (Week 1): Pt will locate 2/3 ADL items R of midline at sink  Skilled Therapeutic Interventions/Progress Updates:  Patient met asleep in bed and awoken with increased encouragement and modifications to patient environment including turning on lights and opening blinds. Patient in agreement with getting to TIS wc to consume meal present at bedside. Patient reporting pain in RLE and cervical neck at rest. Repositioning, pursed lip breathing and distraction for pain management. Supine to EOB with Max A and multimodal cues for sequencing. External assist provided to advance RLE from bed surface to EOB and to bring trunk upright. Total A for set-up of SB and Max A with multimodal cues for hand placement and head/hip relationship for transfer TIS wc <> EOB with RLE maintained on therapists foot for adherence to NWB precautions. Max A for posterior scoot in TIS wc with chair tilted and cues for hand placement and bilateral hip dissociation to assist. R cervical tilt while seated in TIS wc with c/o severe pain but patient resistant to passive stretching. Pursed lip breathing and mindfulness to decrease agitation. Patient able to scoop mashed potatoes on pate with spoon and bring to mouth with L hand. Encouragement for use of RUE at gross assist level with hand over hand assist. Cues for small bites and alternating sips. Patient also able to bring soda can to mouth  and take sips from straw. Patient consumed <25% of meal despite encouragement stating that he did not like the foot items present on his plate. Patient declined grooming tasks seated at sink level with request to return to supine 2/2 fatigue and pain. Patient returned to EOB in same manner as noted above. Patient unable to maintain static sitting at EOB with lateral LOB to L. Max A to return to supine and Mod to Max A with multimodal cues for rolling R<>L in supine to reposition chuck pad. Patient positioned in L side-lying to maintain skin integrity and promote neutral heand and neck alignment. Bed alarm set, call bell within reach, and all needs met.   Therapy Documentation Precautions:  Precautions Precautions: Fall Precaution Comments: R-sided hemi Restrictions Weight Bearing Restrictions: Yes RLE Weight Bearing: Non weight bearing General:    Therapy/Group: Individual Therapy  [name] 02/20/2020, 7:30 AM

## 2020-02-20 NOTE — Progress Notes (Addendum)
Physical Therapy Session Note  Patient Details  Name: Christopher Marks MRN: [id number] Date of Birth: 01/04/1991  Today's Date: 02/20/2020 PT Individual Time: 0800-0920 PT Individual Time Calculation (min): 80 min   Short Term Goals: Week 1:  PT Short Term Goal 1 (Week 1): Patient will perform bed mobility with max A +1. PT Short Term Goal 2 (Week 1): Patient will perform basic transfers with mod A +2. PT Short Term Goal 3 (Week 1): Patient will initiate w/c mobility. PT Short Term Goal 4 (Week 1): Patient will achieve neutral cervical rotation with AROM.  Skilled Therapeutic Interventions/Progress Updates:     Patient in bed with NTs in the room upon PT arrival. Patient alert and agreeable to PT session. Patient with moaning and grimacing indicating pain with moving his R leg and neck during session, RN made aware. Patient unable to provide numerical rating using 10 point scale due to cognitive deficits. PT provided repositioning, rest breaks, and distraction as pain interventions throughout session.   Therapeutic Activity: Bed Mobility: Patient performed rolling R/L with mod-max A with use of bed rail with L hand. Provided cues for technique throughout, cued for patient to bring R knee across rather than push to roll to maintain weight bearing precautions. He performed supine to/from sit with mod-max A. Provided verbal cues for rolling R then setting his bottom elbow to push up to sitting. Transfers: Patient performed a slide board transfer bed>TIS w/c with max A and total for board placement. Provided cues for hand placement, board placement, and head-hips relationship for proper technique and decreased assist with transfers. Placed R foot on therapist's foot in extended position throughout transfer to facilitate NWB.   Wheelchair Mobility:  Patient was transported in the w/c with total A throughout session for energy conservation and time management. Adjusted head rest for improved neck  and head support out of R lateral flexion and rotation. Attempted to adjust leg rests to improved positioning for improved sitting tolerance, unable to extend leg rests at this time.   Neuromuscular Re-ed: Patient performed the following sitting balance and R upper and lower extremity motor control activities: -sitting EOB progressed from max a for sitting balance to min with intermittent supervision, had 1 posterior LOB resulting in lying rest break, focused on midline orientation, head and trunk control in sitting -R knee and hip flexion/extension 2x5in sitting in front of a mirror for visual feedback and improved attention, required cues to open his eyes and look at his R side throughout -R finger flexion with grasp and extension 2x8 progressing from AAROM to AROM  -R elbow flexion/extension x10 progressing from PROM to [facility]  Manual Therapy: -cervical extension, L lateral flexion, and L rotation stretch with gentle over pressure with soft tissue mobilization to R scalenes, splenius capitus, and upper trap with patient seated in the TIS w/c in full tilt to promote cervical extension  Patient oriented to place and required min cues for the date and situation. Stated he was unable to recite his ABC or count to 10, did demonstrate he could count to 10 with increased encouragement during session.   Patient in TIS w/c at end of session with breaks locked, seat belt alarm set, and all needs within reach.   Therapy Documentation Precautions:  Precautions Precautions: Fall Precaution Comments: R-sided hemi Restrictions Weight Bearing Restrictions: Yes RLE Weight Bearing: Non weight bearing   Therapy/Group: Individual Therapy  Amber Williard L Ailyne Pawley PT, DPT  02/20/2020, 12:47 PM

## 2020-02-20 NOTE — Progress Notes (Signed)
Speech Language Pathology Daily Session Note  Patient Details  Name: DYER KLUG MRN: [id number] Date of Birth: April 14, 1991  Today's Date: 02/20/2020 SLP Individual Time: 1015-1055 SLP Individual Time Calculation (min): 40 min  Short Term Goals: Week 1: SLP Short Term Goal 1 (Week 1): Patient will participate in full cognitive-linguistic testing to determine baseline scores. SLP Short Term Goal 2 (Week 1): Patient will perform basic functional ADL"s with modA for accuracy and to initiate. SLP Short Term Goal 3 (Week 1): Patient will consume trials of regular texture solids with SLP without significant difficulty or overt s/s of aspiration or penetration. SLP Short Term Goal 4 (Week 1): Patient will orient self to time/place/situation with minA cues for use of external aids as needed. SLP Short Term Goal 5 (Week 1): Patient will express wants/needs/thoughts at sentence and conversational level with 90% intelligibility of speech with supervision A.  Skilled Therapeutic Interventions: Skilled treatment session focused on cognitive goals. Upon arrival, patient was upright in the bed and requesting to get back in bed. With encouragement, patient agreeable to participate. SLP facilitated session by administering the [facility] Mental Status Examination (SLUMS) Patient scored  13/30 points with a score of 27 or above considered normal. Patient demonstrated deficits in attention, recall and problem solving. Patient also required Max A for emergent awareness in regards to his current cognitive and physical deficits and their impact on his overall function. Patient left upright in the wheelchair with alarm on and all needs within reach. Continue with current plan of care.   Pain  No/Denies Pain   Therapy/Group: Individual Therapy  Valeri Sula 02/20/2020, 3:05 PM
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
## 2020-02-20 NOTE — Progress Notes (Signed)
Pt voided: 600 ml of amber colored urine. PVR: 0 ml

## 2020-02-20 NOTE — Progress Notes (Signed)
Patient ID: Christopher Marks, male   DOB: 03/10/1991, 29 y.o.   MRN: [id number]   SW called pt grandmother Aurea Graff [phone]) to introduce self, explain role, and discuss discharge process. SW confirms he will d/c to her home. Anticipates support from his mother and his girlfriend. SW informed will follow-up with updates after team conference tomorrow.  [name], MSW, LCSWA Office: [phone] Cell: [phone] Fax: [phone]

## 2020-02-20 NOTE — Progress Notes (Signed)
Occupational Therapy Session Note  Patient Details  Name: Christopher Marks MRN: [id number] Date of Birth: 31-Jul-1990  Today's Date: 02/20/2020 OT Individual Time: 1130-1200 OT Individual Time Calculation (min): 30 min    Short Term Goals: Week 1:  OT Short Term Goal 1 (Week 1): Pt will sit EOB wiht MOD A of 1 caregiver in prep for seated toileitng OT Short Term Goal 2 (Week 1): Pt will transfer wiht 1 caregiver and LRAD to w/c in prep for toilet transfer OT Short Term Goal 3 (Week 1): Pt will complete 2/4 steps of UB dressing OT Short Term Goal 4 (Week 1): Pt will locate 2/3 ADL items R of midline at sink  Skilled Therapeutic Interventions/Progress Updates:    Pt received sitting in TIS with NT present and assisting. Pt requesting to return to bed. Total A to set up transfer and board, +2 present for safety and equipment management, and max A overall for scooting toward the L side. Mod facilitation to maintain RLE NWB. Pt sat EOB for 1 minute at supervision level before LOB to the L. Max A to return to supine. From supine pt participated in [place] task of grasp and release with functional reach component. Pt was able to activate level 2 clothespin with mod facilitation required for pronation, elbow extension and shoulder forward flexion. Pt was positioned into L sidelying to promote skin integrity on his sacrum, as well as more neutral head and neck alignment. All needs met, bed alarm set.   Therapy Documentation Precautions:  Precautions Precautions: Fall Precaution Comments: R-sided hemi Restrictions Weight Bearing Restrictions: Yes RLE Weight Bearing: Non weight bearing   Therapy/Group: Individual Therapy  Curtis Sites 02/20/2020, 6:50 AM

## 2020-02-21 ENCOUNTER — Inpatient Hospital Stay (HOSPITAL_COMMUNITY): Payer: Self-pay

## 2020-02-21 ENCOUNTER — Inpatient Hospital Stay (HOSPITAL_COMMUNITY): Payer: Self-pay | Admitting: Speech Pathology

## 2020-02-21 ENCOUNTER — Inpatient Hospital Stay (HOSPITAL_COMMUNITY): Payer: Self-pay | Admitting: Physical Therapy

## 2020-02-21 LAB — CBC WITH DIFFERENTIAL/PLATELET
Abs Immature Granulocytes: 0.22 10*3/uL — ABNORMAL HIGH (ref 0.00–0.07)
Basophils Absolute: 0.1 10*3/uL (ref 0.0–0.1)
Basophils Relative: 1 %
Eosinophils Absolute: 0.1 10*3/uL (ref 0.0–0.5)
Eosinophils Relative: 0 %
HCT: 27 % — ABNORMAL LOW (ref 39.0–52.0)
Hemoglobin: 8.5 g/dL — ABNORMAL LOW (ref 13.0–17.0)
Immature Granulocytes: 1 %
Lymphocytes Relative: 14 %
Lymphs Abs: 2.3 10*3/uL (ref 0.7–4.0)
MCH: 25.7 pg — ABNORMAL LOW (ref 26.0–34.0)
MCHC: 31.5 g/dL (ref 30.0–36.0)
MCV: 81.6 fL (ref 80.0–100.0)
Monocytes Absolute: 1.3 10*3/uL — ABNORMAL HIGH (ref 0.1–1.0)
Monocytes Relative: 8 %
Neutro Abs: 12.4 10*3/uL — ABNORMAL HIGH (ref 1.7–7.7)
Neutrophils Relative %: 76 %
Platelets: 539 10*3/uL — ABNORMAL HIGH (ref 150–400)
RBC: 3.31 MIL/uL — ABNORMAL LOW (ref 4.22–5.81)
RDW: 15 % (ref 11.5–15.5)
WBC: 16.3 10*3/uL — ABNORMAL HIGH (ref 4.0–10.5)
nRBC: 0 % (ref 0.0–0.2)

## 2020-02-21 LAB — BASIC METABOLIC PANEL
Anion gap: 9 (ref 5–15)
BUN: 12 mg/dL (ref 6–20)
CO2: 20 mmol/L — ABNORMAL LOW (ref 22–32)
Calcium: 8.5 mg/dL — ABNORMAL LOW (ref 8.9–10.3)
Chloride: 104 mmol/L (ref 98–111)
Creatinine, Ser: 0.81 mg/dL (ref 0.61–1.24)
GFR, Estimated: >60 mL/min (ref >60)
Glucose, Bld: 167 mg/dL — ABNORMAL HIGH (ref 70–99)
Potassium: 3.3 mmol/L — ABNORMAL LOW (ref 3.5–5.1)
Sodium: 133 mmol/L — ABNORMAL LOW (ref 135–145)

## 2020-02-21 LAB — URINALYSIS, ROUTINE W REFLEX MICROSCOPIC
Bilirubin Urine: NEGATIVE
Glucose, UA: NEGATIVE mg/dL
Ketones, ur: NEGATIVE mg/dL
Nitrite: NEGATIVE
Protein, ur: 100 mg/dL — AB
Specific Gravity, Urine: 1.017 (ref 1.005–1.030)
pH: 5 (ref 5.0–8.0)

## 2020-02-21 LAB — LACTIC ACID, PLASMA: Lactic Acid, Venous: 2.9 mmol/L (ref 0.5–1.9)

## 2020-02-21 IMAGING — DX DG CHEST 1V PORT
1 series · 1 of 1 positions shown · non-contrast
Comparison: [DATE]

CLINICAL DATA: Fever.

EXAM:
PORTABLE CHEST 1 VIEW

[chest ap]
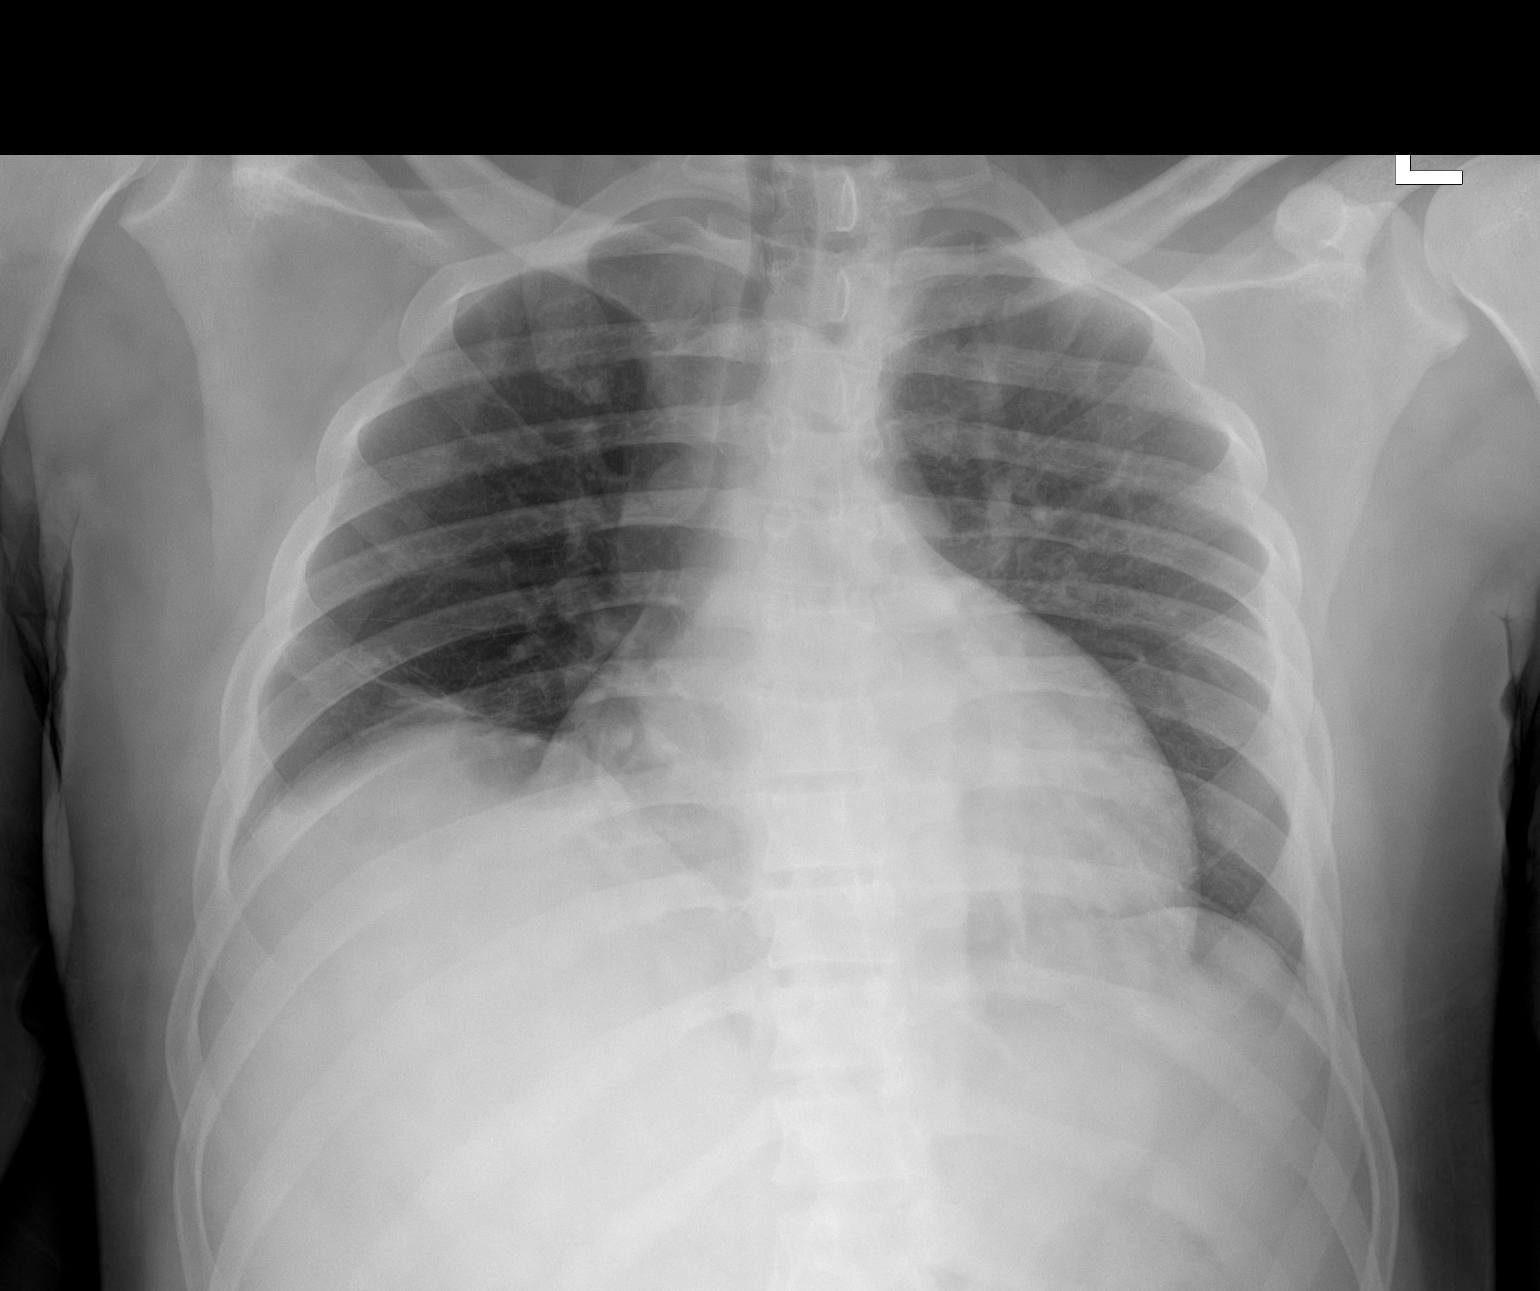

[1 of 1 positions shown; findings below may reference images not displayed]

FINDINGS: The right-sided PICC line has been removed.

The heart is within normal limits in size given the AP projection,
portable technique and supine position of the patient.

Stable moderate eventration of the right hemidiaphragm with
overlying scarring or atelectasis. No definite infiltrates or
effusions.

The bony thorax is intact.
IMPRESSION: Stable moderate eventration of the right hemidiaphragm with
overlying scarring or atelectasis.

## 2020-02-21 MED ORDER — LACTULOSE 10 GM/15ML PO SOLN
30.0000 g | Freq: Two times a day (BID) | ORAL | Status: DC
  Administered 2020-02-21 – 2020-03-05 (×24): 30 g via ORAL
  Filled 2020-02-21 (×32): qty 45

## 2020-02-21 MED ORDER — VANCOMYCIN HCL IN DEXTROSE 1-5 GM/200ML-% IV SOLN
1000.0000 mg | Freq: Three times a day (TID) | INTRAVENOUS | Status: DC
  Filled 2020-02-21 (×2): qty 200

## 2020-02-21 MED ORDER — CHLORDIAZEPOXIDE HCL 25 MG PO CAPS
25.0000 mg | ORAL_CAPSULE | Freq: Two times a day (BID) | ORAL | Status: DC
  Administered 2020-02-21 – 2020-02-29 (×15): 25 mg via ORAL
  Filled 2020-02-21 (×15): qty 1

## 2020-02-21 MED ORDER — SODIUM CHLORIDE 0.9 % IV SOLN
2.0000 g | Freq: Three times a day (TID) | INTRAVENOUS | Status: DC
  Administered 2020-02-21 – 2020-02-25 (×13): 2 g via INTRAVENOUS
  Filled 2020-02-21 (×17): qty 2

## 2020-02-21 MED ORDER — SODIUM CHLORIDE 0.9 % IV SOLN: INTRAVENOUS | Status: DC

## 2020-02-21 MED ORDER — VANCOMYCIN HCL IN DEXTROSE 1-5 GM/200ML-% IV SOLN
1000.0000 mg | Freq: Three times a day (TID) | INTRAVENOUS | Status: DC
  Administered 2020-02-21 – 2020-02-25 (×12): 1000 mg via INTRAVENOUS
  Filled 2020-02-21 (×13): qty 200

## 2020-02-21 NOTE — Progress Notes (Signed)
Physical Therapy Session Note  Patient Details  Name: Christopher Marks MRN: [id number] Date of Birth: 17-Jul-1990  Today's Date: 02/21/2020 PT Missed Time: 45 Minutes Missed Time Reason: Patient fatigue  Dan, PA cleared pt to participate in therapy session. Pt received L sidelying in bed in a deep sleep, snoring. Therapist provided auditory and light tactile stimulus on pt's arm to arouse and pt maintained eyes closed but was able to mumble responses to therapist's questions declining participation at this time. Therapist attempted to increase arousal with continued auditory and tactile stimulus while providing encouragement to participate in session but pt starts to become agitated continuing to decline participation. Pt left in bed with needs in reach, lines intact, and bed alarm on. Missed 45 minutes of skilled physical therapy.   Ginny Forth , PT, DPT, CSRS  02/21/2020, 12:32 PM

## 2020-02-21 NOTE — Progress Notes (Signed)
Critical labs: Lactic 2.9. MD notified. Continue with current orders at this time.

## 2020-02-21 NOTE — Progress Notes (Signed)
Speech Language Pathology Daily Session Note  Patient Details  Name: Christopher Marks MRN: [id number] Date of Birth: 10/05/1990  Today's Date: 02/21/2020 SLP Individual Time: 1100-1155 SLP Individual Time Calculation (min): 55 min  Short Term Goals: Week 1: SLP Short Term Goal 1 (Week 1): Patient will participate in full cognitive-linguistic testing to determine baseline scores. SLP Short Term Goal 2 (Week 1): Patient will perform basic functional ADL"s with modA for accuracy and to initiate. SLP Short Term Goal 3 (Week 1): Patient will consume trials of regular texture solids with SLP without significant difficulty or overt s/s of aspiration or penetration. SLP Short Term Goal 4 (Week 1): Patient will orient self to time/place/situation with minA cues for use of external aids as needed. SLP Short Term Goal 5 (Week 1): Patient will express wants/needs/thoughts at sentence and conversational level with 90% intelligibility of speech with supervision A.  Skilled Therapeutic Interventions: Skilled treatment session focused on dysphagia and cognitive goals. Upon arrival, patient was lethargic and required more than a reasonable amount of time for arousal. SLP facilitated session by providing a snack of Dys. 3 textures and thin liquids per his request. Patient consumed meal without overt s/s of aspiration but required Mod verbal cues for attention to bolus due to fatigue and lethargy.  Patient requested to void but became verbally frustrated that he had to use the urinal and was unable to void, this eventually led to intermittent verbal agitiation and requesting to call his grandmother. SLP dialed the number and patient began telling his grandmother he was being discharged today, etc. Patient's grandmother was denying information resulting in patient cursing at his grandmother and she hanging up the phone. Patient remained verbally agitated, especially when hearing about his discharge date. Patient  eventually able to calm down and left supine in bed with alarm on and all needs within reach. Continue with current plan of care.      Pain No/Denies Pain   Therapy/Group: Individual Therapy  [name] 02/21/2020, 12:04 PM
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
## 2020-02-21 NOTE — Progress Notes (Signed)
Patient ID: NORI POLAND, male   DOB: 1990-12-30, 29 y.o.   MRN: [id number]  SW met with pt in room to provide updates on d/c date 03/15/2020. SW called pt grandmother Remo Lipps [phone]) to provide updates from team conference. Continues to report that she has not received documents for Medicaid, and called Medicaid worker. SW informed there will continue to be updates on d/c needs. SW sent email to [place] [facility] to see if there is anyway to assist in this process.   Loralee Pacas, MSW, [place] Office: [phone] Cell: [phone] Fax: [phone]

## 2020-02-21 NOTE — Progress Notes (Signed)
Occupational Therapy Session Note  Patient Details  Name: Christopher Marks MRN: [id number] Date of Birth: [birth year]  Today's Date: 02/21/2020 OT Individual Time: 0626-9485 OT Individual Time Calculation (min): 40 min   Session 2: OT Individual Time: 1300-1330 OT Individual Time Calculation (min): 30 min  15 min missed 2/2 lethargy   Short Term Goals: Week 1:  OT Short Term Goal 1 (Week 1): Pt will sit EOB wiht MOD A of 1 caregiver in prep for seated toileitng OT Short Term Goal 2 (Week 1): Pt will transfer wiht 1 caregiver and LRAD to w/c in prep for toilet transfer OT Short Term Goal 3 (Week 1): Pt will complete 2/4 steps of UB dressing OT Short Term Goal 4 (Week 1): Pt will locate 2/3 ADL items R of midline at sink   Skilled Therapeutic Interventions/Progress Updates:    Pt received supine with no c/o pain. Pt reported need to use the bathroom. BSC transfer unsafe at this time so used bed pan at bed level. Pt was able to roll to the R with min A. Max A to roll L for bedpan placement. Pt voided small, loose BM and required total A for peri hygiene and to don new brief. Max A to don new pants, with pt making good effort to bridge his hips and pull up L side of pants in bed. Pt initially stating he would like to get OOB, but then after obtaining a +2 assist and initiating transfer, pt began declining transfer and yelling. Pt donned a gown at bed level with max A. Pt was left supine with slight L sidelying, towel roll on the R lateral neck. Bed alarm set.   Session 2: Pt received supine, on the phone with his grandmother. Pt ended phone call and conversed with therapist appropriately for several minutes before falling asleep several times. Attempted to initiate bed mobility to increase arousal, pt adamantly declining. Obvious oral residue from lunch present and pt agreeable for OT to assist with oral care. Max A overall to brush teeth and suction excess residue. Pt was agreeable to manual  therapy on his neck 2/2 R lateral flexion tightness. A moist heat pack was applied to his upper/mid trapezius and sternocleidomastoid to increase muscle elasticity for increased AROM/reduced pain. Pt tolerated manual PROM to the L for several minutes with very slow progression and plenty of rest breaks. Pt began audibly snoring and session was terminated 2/2 lethargy. Pt positioned in modified L sidelying dependently. Bed alarm set.    Therapy Documentation Precautions:  Precautions Precautions: Fall Precaution Comments: R-sided hemi Restrictions Weight Bearing Restrictions: Yes RLE Weight Bearing: Non weight bearing   Therapy/Group: Individual Therapy  Crissie Reese 02/21/2020, 10:06 AM
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
## 2020-02-21 NOTE — Progress Notes (Signed)
Orthopedic Tech Progress Note Patient Details:  RUPERT AZZARA 1991-01-02 [id number] Called in order to [facility] for A PRAFO BOOT Patient ID: BISHOY CUPP, male   DOB: 30-May-1990, 29 y.o.   MRN: [id number]   Donald Pore 02/21/2020, 10:13 AM

## 2020-02-21 NOTE — Progress Notes (Signed)
   02/21/20 0533  Assess: MEWS Score  Temp (!) 100.8 F (38.2 C)  BP (!) 149/87  Pulse Rate (!) 113  Resp 16  SpO2 98 %  O2 Device Room Air  Assess: MEWS Score  MEWS Temp 1  MEWS Systolic 0  MEWS Pulse 2  MEWS RR 0  MEWS LOC 0  MEWS Score 3  MEWS Score Color Yellow  Assess: if the MEWS score is Yellow or Red  Were vital signs taken at a resting state? Yes  Focused Assessment No change from prior assessment  Early Detection of Sepsis Score *See Row Information* Medium  MEWS guidelines implemented *See Row Information* Yes  Take Vital Signs  Increase Vital Sign Frequency  Yellow: Q 2hr X 2 then Q 4hr X 2, if remains yellow, continue Q 4hrs  Escalate  MEWS: Escalate Yellow: discuss with charge nurse/RN and consider discussing with provider and RRT  Document  Patient Outcome Stabilized after interventions  Progress note created (see row info) Yes

## 2020-02-21 NOTE — Progress Notes (Signed)
Patient ID: Christopher Marks, male   DOB: 06/23/1990, 29 y.o.   MRN: [id number]  SW updated [name]/[facility] Dir of RN [phone]) on pt d/c date and current level of care needs. SW to follow-up a week before d/c on care needs.   [name], MSW, LCSWA Office: [phone] Cell: [phone] Fax: [phone]
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
## 2020-02-21 NOTE — Progress Notes (Signed)
   02/21/20 0603  Assess: MEWS Score  Temp (!) 102.2 F (39 C)  BP (!) 146/83  Pulse Rate (!) 108  Resp 16  SpO2 99 %  O2 Device Room Air  Assess: MEWS Score  MEWS Temp 2  MEWS Systolic 0  MEWS Pulse 1  MEWS RR 0  MEWS LOC 0  MEWS Score 3  MEWS Score Color Yellow  Assess: if the MEWS score is Yellow or Red  Were vital signs taken at a resting state? Yes  Focused Assessment No change from prior assessment  Early Detection of Sepsis Score *See Row Information* Medium  MEWS guidelines implemented *See Row Information* Yes  Take Vital Signs  Increase Vital Sign Frequency  Yellow: Q 2hr X 2 then Q 4hr X 2, if remains yellow, continue Q 4hrs  Escalate  MEWS: Escalate Yellow: discuss with charge nurse/RN and consider discussing with provider and RRT  Document  Patient Outcome Stabilized after interventions  Progress note created (see row info) Yes

## 2020-02-21 NOTE — Progress Notes (Signed)
   02/21/20 0815  Assess: MEWS Score  Temp (!) 100.7 F (38.2 C)  BP (!) 129/98  Pulse Rate 99  Resp (!) 22  Level of Consciousness Responds to Voice  SpO2 100 %  O2 Device Room Air  Assess: MEWS Score  MEWS Temp 1  MEWS Systolic 0  MEWS Pulse 0  MEWS RR 1  MEWS LOC 1  MEWS Score 3  MEWS Score Color Yellow  Assess: if the MEWS score is Yellow or Red  Were vital signs taken at a resting state? Yes  Focused Assessment No change from prior assessment  Early Detection of Sepsis Score *See Row Information* High  MEWS guidelines implemented *See Row Information* Yes  Take Vital Signs  Increase Vital Sign Frequency  Yellow: Q 2hr X 2 then Q 4hr X 2, if remains yellow, continue Q 4hrs  Escalate  MEWS: Escalate Yellow: discuss with charge nurse/RN and consider discussing with provider and RRT  Notify: Charge Nurse/RN  Name of Charge Nurse/RN Notified Mekedis  Date Charge Nurse/RN Notified 02/21/20  Time Charge Nurse/RN Notified 0815  Notify: Provider  Provider Name/Title Dan, PA  Date Provider Notified 02/21/20  Time Provider Notified [phone]  Notification Type Face-to-face  Notification Reason Other (Comment) (Ongoing assessments/orders)  Response See new orders  Date of Provider Response 02/21/20  Time of Provider Response [phone]  Document  Patient Outcome Stabilized after interventions  Progress note created (see row info) Yes

## 2020-02-21 NOTE — Progress Notes (Signed)
Enteric Precautions placed per order. Pending stool to send for PCR.

## 2020-02-21 NOTE — Progress Notes (Signed)
   02/21/20 0455  Assess: MEWS Score  Temp (!) 103.1 F (39.5 C)  BP 125/77  Pulse Rate (!) 108  Resp 18  SpO2 97 %  O2 Device Room Air  Assess: MEWS Score  MEWS Temp 2  MEWS Systolic 0  MEWS Pulse 1  MEWS RR 0  MEWS LOC 0  MEWS Score 3  MEWS Score Color Yellow  Assess: if the MEWS score is Yellow or Red  Were vital signs taken at a resting state? Yes  Focused Assessment No change from prior assessment  Early Detection of Sepsis Score *See Row Information* Medium  MEWS guidelines implemented *See Row Information* Yes  Treat  MEWS Interventions Administered prn meds/treatments;Escalated (See documentation below)  Pain Scale 0-10  Pain Score 0  Constipation interventions Laxative  Patients response to intervention Unchanged  Take Vital Signs  Increase Vital Sign Frequency  Yellow: Q 2hr X 2 then Q 4hr X 2, if remains yellow, continue Q 4hrs (every 30 min x2, then continue protocol per Arnell Asal)  Escalate  MEWS: Escalate Yellow: discuss with charge nurse/RN and consider discussing with provider and RRT  Notify: Charge Nurse/RN  Name of Charge Nurse/RN Notified Vivianne Spence  Date Charge Nurse/RN Notified 02/21/20  Time Charge Nurse/RN Notified 0500  Notify: Provider  Provider Name/Title Arnell Asal  Date Provider Notified 02/21/20  Time Provider Notified 0510  Notification Type Call  Notification Reason Change in status  Response See new orders  Date of Provider Response 02/21/20  Time of Provider Response 0515  Document  Patient Outcome Not stable and remains on department  Progress note created (see row info) Yes

## 2020-02-21 NOTE — Plan of Care (Addendum)
Behavioral Plan   [place] Level: VI-VII  Behavior to decrease/ eliminate:  -agitation with staff (nursing especially)  -verbal outbursts -lethargy  Changes to environment:  -lights on blinds up -TV off -increased OOB tolerance -Bed alarm set on middle setting  Interventions: -place patient in L side-lying with pillows under R arm and between legs for improved neck positioning -Place R PRAFO at night and as tolerated during the day -Whenever possible reduce middle of the night interventions  -Phone/tablet use stops at 10 pm to promote sleep   Recommendations for interactions with patient: -distraction and redirection with outbursts and agitation -provide breaks with agitation -Explain reasoning for medications   Attendees: Feliberto Gottron, SLP, Jake Shark, OT, Serina Cowper, PT

## 2020-02-22 ENCOUNTER — Inpatient Hospital Stay (HOSPITAL_COMMUNITY): Payer: Self-pay | Admitting: Speech Pathology

## 2020-02-22 ENCOUNTER — Inpatient Hospital Stay (HOSPITAL_COMMUNITY): Payer: Self-pay

## 2020-02-22 ENCOUNTER — Inpatient Hospital Stay (HOSPITAL_COMMUNITY): Payer: Self-pay | Admitting: Occupational Therapy

## 2020-02-22 LAB — CBC WITH DIFFERENTIAL/PLATELET
Abs Immature Granulocytes: 0.14 10*3/uL — ABNORMAL HIGH (ref 0.00–0.07)
Basophils Absolute: 0.1 10*3/uL (ref 0.0–0.1)
Basophils Relative: 1 %
Eosinophils Absolute: 0.4 10*3/uL (ref 0.0–0.5)
Eosinophils Relative: 4 %
HCT: 27 % — ABNORMAL LOW (ref 39.0–52.0)
Hemoglobin: 8.4 g/dL — ABNORMAL LOW (ref 13.0–17.0)
Immature Granulocytes: 1 %
Lymphocytes Relative: 18 %
Lymphs Abs: 2 10*3/uL (ref 0.7–4.0)
MCH: 25.2 pg — ABNORMAL LOW (ref 26.0–34.0)
MCHC: 31.1 g/dL (ref 30.0–36.0)
MCV: 81.1 fL (ref 80.0–100.0)
Monocytes Absolute: 1.4 10*3/uL — ABNORMAL HIGH (ref 0.1–1.0)
Monocytes Relative: 12 %
Neutro Abs: 7.3 10*3/uL (ref 1.7–7.7)
Neutrophils Relative %: 64 %
Platelets: 486 10*3/uL — ABNORMAL HIGH (ref 150–400)
RBC: 3.33 MIL/uL — ABNORMAL LOW (ref 4.22–5.81)
RDW: 15.3 % (ref 11.5–15.5)
WBC: 11.4 10*3/uL — ABNORMAL HIGH (ref 4.0–10.5)
nRBC: 0 % (ref 0.0–0.2)

## 2020-02-22 LAB — LACTIC ACID, PLASMA: Lactic Acid, Venous: 1.3 mmol/L (ref 0.5–1.9)

## 2020-02-22 MED ORDER — METRONIDAZOLE 500 MG PO TABS
500.0000 mg | ORAL_TABLET | Freq: Three times a day (TID) | ORAL | Status: DC
  Administered 2020-02-22 – 2020-02-25 (×8): 500 mg via ORAL
  Filled 2020-02-22 (×8): qty 1

## 2020-02-22 MED ORDER — POTASSIUM CHLORIDE CRYS ER 20 MEQ PO TBCR
20.0000 meq | EXTENDED_RELEASE_TABLET | Freq: Every day | ORAL | Status: DC
  Administered 2020-02-22 – 2020-03-12 (×20): 20 meq via ORAL
  Filled 2020-02-22 (×20): qty 1

## 2020-02-22 NOTE — Progress Notes (Addendum)
Speech Language Pathology Daily Session Note  Patient Details  Name: Christopher Marks MRN: [id number] Date of Birth: August 02, 1990  Today's Date: 02/22/2020 SLP Individual Time: 0175-1025 SLP Individual Time Calculation (min): 55 min  Short Term Goals: Week 1: SLP Short Term Goal 1 (Week 1): Patient will participate in full cognitive-linguistic testing to determine baseline scores. SLP Short Term Goal 2 (Week 1): Patient will perform basic functional ADL"s with modA for accuracy and to initiate. SLP Short Term Goal 3 (Week 1): Patient will consume trials of regular texture solids with SLP without significant difficulty or overt s/s of aspiration or penetration. SLP Short Term Goal 4 (Week 1): Patient will orient self to time/place/situation with minA cues for use of external aids as needed. SLP Short Term Goal 5 (Week 1): Patient will express wants/needs/thoughts at sentence and conversational level with 90% intelligibility of speech with supervision A.  Skilled Therapeutic Interventions: Skilled treatment session focused on dysphagia and cognitive goals. SLP facilitated session by providing overall Max A verbal cues for problem solving and sustained attention during a basic money management task. Patient's girlfriend arrived in middle of session and brought patient regular textured foods as she was unaware of his current diet restrictions.  Patient consumed trials of regular textures and demonstrated prolonged mastication with Mod verbal cues for attention to task due to poor awareness of bolus. No overt s/s of aspiration noted. Patient ate ~2 fries and 4 bites of chicken and reported his stomach hurt, the snack was then discontinued. Recommend patient remain on current diet. Patient's girlfriend educated regarding patient's current swallowing function, diet recommendations and appropriate textures. She was apologetic and verbalized understanding, especially after seeing the amount of time it took for  patient to masticate. She then independently verbalized a list of appropriate food options she can bring from home. Of note, there was no agitation or verbal frustration noted throughout session, patient actually apologized for his behavior from the previous session and remained calm and cooperative throughout. Patient left upright in the wheelchair with alarm on, girlfriend present and all needs within reach. Continue with current plan of care.      Pain Pain Assessment Pain Scale: 0-10 Pain Score: 0-No pain Pain Location: Leg Pain Orientation: Lower;Right;Left Pain Intervention(s): Medication (See eMAR)  Therapy/Group: Individual Therapy  Jowana Thumma 02/22/2020, 11:49 AM

## 2020-02-22 NOTE — Progress Notes (Signed)
Patient with recurrent fever--reports malaise and RLQ>LLQ abdominal pain as well as nausea. IVF increased to 100 cc/hr. Lactic acid, CBC and CMET repeated. Has had liver laceration--question abscess on 10/25--may need to repeat CT abdomen/pelvis for follow up if fevers persist. Will await LFTs.

## 2020-02-22 NOTE — Progress Notes (Signed)
Vital signs taken at 2142 at resting state patient awoke with headache and given tylenol for a temp of 101.4 and headache pain. Mews vital signs protocol implemented for increased vital signs frequency. Charge nurse sue notified     02/22/20 2142  Assess: MEWS Score  Temp (!) 101.4 F (38.6 C) (Simultaneous filing. User may not have seen previous data.)  BP 133/82  Pulse Rate (!) 103  Resp (!) 27  Level of Consciousness Alert  SpO2 100 %  O2 Device Room Air  Assess: MEWS Score  MEWS Temp 1  MEWS Systolic 0  MEWS Pulse 1  MEWS RR 2  MEWS LOC 0  MEWS Score 4  MEWS Score Color Red  Assess: if the MEWS score is Yellow or Red  Were vital signs taken at a resting state? Yes  Focused Assessment Change from prior assessment (see assessment flowsheet)  Early Detection of Sepsis Score *See Row Information* Low  MEWS guidelines implemented *See Row Information* Yes  Treat  MEWS Interventions Administered scheduled meds/treatments;Administered prn meds/treatments  Pain Scale 0-10  Pain Score 7  Pain Type Acute pain  Pain Location Leg  Pain Orientation Right  Pain Descriptors / Indicators Discomfort  Pain Frequency Intermittent  Pain Onset With Activity  Patients Stated Pain Goal 2  Pain Intervention(s) Medication (See eMAR);Repositioned  Multiple Pain Sites No  Take Vital Signs  Increase Vital Sign Frequency  Red: Q 1hr X 4 then Q 4hr X 4, if remains red, continue Q 4hrs  Escalate  MEWS: Escalate Red: discuss with charge nurse/RN and provider, consider discussing with RRT  Notify: Charge Nurse/RN  Name of Charge Nurse/RN Notified Fannie Knee   Date Charge Nurse/RN Notified 02/21/20  Time Charge Nurse/RN Notified 2150
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
## 2020-02-22 NOTE — Progress Notes (Signed)
Physical Therapy Weekly Progress Note  Patient Details  Name: Christopher Marks MRN: [id number] Date of Birth: 18-May-1990  Beginning of progress report period: February 16, 2020 End of progress report period: February 23, 2020  Patient has met 3 of 4 short term goals.  Patient with slow progress this week, limited by lethargy and fever with UTI at end of the week. He currently requires max A for bed mobility, max A for slide board transfers, mod-max A +2 for squat pivot and partial sit to stand, max-supervision sitting balance limited by fatigue lethargy, and is tolerating 1-2 hours OOB a day. Cognitive deficits fluctuate with arousal levels with increased agitation when feeling ill.   Patient continues to demonstrate the following deficits muscle weakness and muscle joint tightness, decreased cardiorespiratoy endurance, impaired timing and sequencing, unbalanced muscle activation, decreased coordination and decreased motor planning, decreased midline orientation, decreased attention to right, right side neglect and decreased motor planning, decreased initiation, decreased attention, decreased awareness, decreased problem solving, decreased safety awareness and decreased memory and decreased sitting balance, decreased standing balance, decreased postural control, hemiplegia, decreased balance strategies and difficulty maintaining precautions and therefore will continue to benefit from skilled PT intervention to increase functional independence with mobility.  Patient progressing toward long term goals..  Continue plan of care.  PT Short Term Goals Week 1:  PT Short Term Goal 1 (Week 1): Patient will perform bed mobility with max A +1. PT Short Term Goal 1 - Progress (Week 1): Met PT Short Term Goal 2 (Week 1): Patient will perform basic transfers with mod A +2. PT Short Term Goal 2 - Progress (Week 1): Met PT Short Term Goal 3 (Week 1): Patient will initiate w/c mobility. PT Short Term Goal 3 -  Progress (Week 1): Progressing toward goal PT Short Term Goal 4 (Week 1): Patient will achieve neutral cervical rotation with AROM. PT Short Term Goal 4 - Progress (Week 1): Met Week 2:  PT Short Term Goal 1 (Week 2): Patient will perform bed mobility with mod A. PT Short Term Goal 2 (Week 2): Patient will perform basic transfers with mod A of 1 person. PT Short Term Goal 3 (Week 2): Patient will perform sit to/from stand with mod A +2. PT Short Term Goal 4 (Week 2): Patient will initaite w/c mobility.  Skilled Therapeutic Interventions/Progress Updates:  Ambulation/gait training;Cognitive remediation/compensation;Discharge planning;DME/adaptive equipment instruction;Pain management;Functional mobility training;Psychosocial support;Splinting/orthotics;Therapeutic Activities;UE/LE Strength taining/ROM;Visual/perceptual remediation/compensation;Balance/vestibular training;Community reintegration;Disease management/prevention;Functional electrical stimulation;Neuromuscular re-education;Patient/family education;Skin care/wound management;Stair training;Therapeutic Exercise;UE/LE Coordination activities;Wheelchair propulsion/positioning   Therapy Documentation Precautions:  Precautions Precautions: Fall Precaution Comments: R-sided hemi Restrictions Weight Bearing Restrictions: Yes RLE Weight Bearing: Non weight bearing   Therapy/Group: Individual Therapy  Doreene Burke PT, DPT Page Spiro, PT, DPT, CSRS  02/23/2020, 6:44 AM

## 2020-02-22 NOTE — Progress Notes (Signed)
Physical Therapy Session Note  Patient Details  Name: Christopher Marks MRN: [id number] Date of Birth: Feb 06, 1991  Today's Date: 02/22/2020 PT Individual Time: 0915-1012 PT Individual Time Calculation (min): 57 min   Short Term Goals: Week 1:  PT Short Term Goal 1 (Week 1): Patient will perform bed mobility with max A +1. PT Short Term Goal 2 (Week 1): Patient will perform basic transfers with mod A +2. PT Short Term Goal 3 (Week 1): Patient will initiate w/c mobility. PT Short Term Goal 4 (Week 1): Patient will achieve neutral cervical rotation with AROM.  Skilled Therapeutic Interventions/Progress Updates:     Patient in bed asleep upon PT arrival. Patient aroused to verbal and tactile stimulation and agreeable to PT session. Patient denied pain during session, reported receiving pain medicine prior to session. Patient was oriented to self, location, and situation, required min cues for orientation questions for day of the week and month. He was lethargic at the beginning of the session, however, became much more alert and verbal once sitting up. He also stated, "I need to apologize to the nurse for cussing her out yesterday," "I was having a bad day."  Patient was incontinent of urine and agreeable to attempting toileting on [facility], as RN reported that a stool sample needed to be obtained.  Therapeutic Activity: Bed Mobility: Patient performed rolling R/L for peri-care and removal of his soiled brief with mod A +2 for positioning and facilitation of R arm and leg placement and use of L hand to pull on bed rails. He performed supine to sit with mod A +2 for trunk and lower extremity support. Provided verbal cues for sequencing with facilitation. Transfers: Patient performed squat pivot bed>BSC with max A+2. He was continent of bowl while seated on the [facility] with min A for trunk support and cues for midline orientation due to L posterior lean. He performed sit to/form partial stand with max A of  1-2 to perform peri-care then continued into a stand pivot to the TIS w/c wear a brief and gown were donned with total A. Therapist placed a foot under his R foot extending his R leg out to maintain NWB precautions with all transfers.   Wheelchair Mobility:  Patient was transported in the TIS w/c with total A throughout session for energy conservation and time management.  Neuromuscular Re-ed: Patient performed the following cognitive remediation, R attention, and R upper extremity motor control activities seated in the TIS w/c: -played giant connect four x2, patient able to state the rules of the game prior to playing. Cued patient to perform visual scanning of full board throughout the game. The patient only placed his pieces on the L side and demonstrated decreased L attention throughout the games, cued patient to use his R hand to grasp the pieces when handed to him and facilitated elbow and shoulder extension for him to place the pieces in with his L hand >50% of the time while playing.   Patient in TIS w/c with NT in the room at end of session with breaks locked, seat belt alarm set, and all needs within reach.    Therapy Documentation Precautions:  Precautions Precautions: Fall Precaution Comments: R-sided hemi Restrictions Weight Bearing Restrictions: Yes RLE Weight Bearing: Non weight bearing   Therapy/Group: Individual Therapy  Nariya Neumeyer L Aidan Moten PT, DPT  02/22/2020, 12:33 PM
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
## 2020-02-23 ENCOUNTER — Inpatient Hospital Stay (HOSPITAL_COMMUNITY): Payer: Self-pay

## 2020-02-23 ENCOUNTER — Inpatient Hospital Stay (HOSPITAL_COMMUNITY): Payer: Self-pay | Admitting: Speech Pathology

## 2020-02-23 ENCOUNTER — Inpatient Hospital Stay (HOSPITAL_COMMUNITY): Payer: Self-pay | Admitting: Occupational Therapy

## 2020-02-23 DIAGNOSIS — A499 Bacterial infection, unspecified: Secondary | ICD-10-CM

## 2020-02-23 DIAGNOSIS — N390 Urinary tract infection, site not specified: Secondary | ICD-10-CM

## 2020-02-23 LAB — GASTROINTESTINAL PANEL BY PCR, STOOL (REPLACES STOOL CULTURE)

## 2020-02-23 LAB — COMPREHENSIVE METABOLIC PANEL
ALT: 227 U/L — ABNORMAL HIGH (ref 0–44)
AST: 109 U/L — ABNORMAL HIGH (ref 15–41)
Albumin: 2.3 g/dL — ABNORMAL LOW (ref 3.5–5.0)
Alkaline Phosphatase: 169 U/L — ABNORMAL HIGH (ref 38–126)
Anion gap: 9 (ref 5–15)
BUN: 12 mg/dL (ref 6–20)
CO2: 20 mmol/L — ABNORMAL LOW (ref 22–32)
Calcium: 8.6 mg/dL — ABNORMAL LOW (ref 8.9–10.3)
Chloride: 105 mmol/L (ref 98–111)
Creatinine, Ser: 0.81 mg/dL (ref 0.61–1.24)
GFR, Estimated: >60 mL/min (ref >60)
Glucose, Bld: 108 mg/dL — ABNORMAL HIGH (ref 70–99)
Potassium: 4 mmol/L (ref 3.5–5.1)
Sodium: 134 mmol/L — ABNORMAL LOW (ref 135–145)
Total Bilirubin: 1.1 mg/dL (ref 0.3–1.2)
Total Protein: 6.4 g/dL — ABNORMAL LOW (ref 6.5–8.1)

## 2020-02-23 LAB — URINE CULTURE: Culture: >=100000 — AB
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
## 2020-02-23 NOTE — Progress Notes (Signed)
Occupational Therapy Session Note  Patient Details  Name: Christopher Marks MRN: [id number] Date of Birth: 01/01/1991  Today's Date: 02/23/2020 OT Individual Time: 8242-3536 OT Individual Time Calculation (min): 56 min    Short Term Goals: Week 1:  OT Short Term Goal 1 (Week 1): Pt will sit EOB wiht MOD A of 1 caregiver in prep for seated toileitng OT Short Term Goal 2 (Week 1): Pt will transfer wiht 1 caregiver and LRAD to w/c in prep for toilet transfer OT Short Term Goal 3 (Week 1): Pt will complete 2/4 steps of UB dressing OT Short Term Goal 4 (Week 1): Pt will locate 2/3 ADL items R of midline at sink  Skilled Therapeutic Interventions/Progress Updates:    Pt in bed to start session, more pleasant this session.  He was oriented to place and reason for hospitalization.  He was not oriented to day of the week as he stated "Wednesday".  Therapist told him that it was the day after Wednesday to which he initially replied "Tuesday".  He was then told again and he was able to state Thursday.  He was agreeable to completion of bathing task supine to sit as well as for changing his clothing.  He needed max assist to transition from supine to sit and once on the edge of the bed, he maintained his balance with supervision.  He also did a better job throughout sitting for approximately 20 mins at keeping his head more at midline.  He still reported increased neck pain, however, with therapist providing min assist to reposition at midline on some occasions. He was able to complete UB bathing with min assist, integrating the RUE to assist with opening the soap, squeezing out the washcloth, and washing the RUE with overall mod assist. He needed mod assist for washing his lower legs and feet, and needed to return to supine to complete washing of his buttocks and donning of new brief.  Max instructional cueing was needed to re-direct pt to not try to stand to wash his front peri area but to instead do this in  sitting because of his weightbearing restrictions.  Pt was appropriate throughout session, and did not become agitated.  He stated that he was "repenting" and trying to"do better" now since he had talked to his uncle.  He transitioned back to supine with max assist and worked on rolling in the bed with overall mod assist in order for therapist to assist with washing his buttocks and donning new brief and pants. He was able to bridge using the LLE to assist with donning his pants, but still needed to roll in the bed to get them completely over his hips.  He was left in the bed at the end of the session with the call button and phone in reach and safety bed alarm in place.  Drop arm commode left in room as well to work on squat pivot or sliding board transfers to it in future sessions.    Therapy Documentation Precautions:  Precautions Precautions: Fall Precaution Comments: R-sided hemi Restrictions Weight Bearing Restrictions: Yes RLE Weight Bearing: Non weight bearing   Pain: Pain Assessment Pain Scale: Faces Faces Pain Scale: Hurts a little bit Pain Type: Acute pain Pain Location: Leg Pain Orientation: Right Pain Descriptors / Indicators: Discomfort Pain Onset: With Activity Pain Intervention(s): Repositioned ADL: See Care Tool Section for some details of mobility and selfcare  Therapy/Group: Individual Therapy  Devyn Sheerin OTR/L 02/23/2020, 5:08 PM

## 2020-02-23 NOTE — Progress Notes (Signed)
Speech Language Pathology Daily Session Note  Patient Details  Name: Christopher Marks MRN: [id number] Date of Birth: [birth year]  Today's Date: 02/23/2020 SLP Individual Time: 1000-1055 SLP Individual Time Calculation (min): 55 min  Short Term Goals: Week 1: SLP Short Term Goal 1 (Week 1): Patient will participate in full cognitive-linguistic testing to determine baseline scores. SLP Short Term Goal 2 (Week 1): Patient will perform basic functional ADL"s with modA for accuracy and to initiate. SLP Short Term Goal 3 (Week 1): Patient will consume trials of regular texture solids with SLP without significant difficulty or overt s/s of aspiration or penetration. SLP Short Term Goal 4 (Week 1): Patient will orient self to time/place/situation with minA cues for use of external aids as needed. SLP Short Term Goal 5 (Week 1): Patient will express wants/needs/thoughts at sentence and conversational level with 90% intelligibility of speech with supervision A.  Skilled Therapeutic Interventions: Skilled treatment session focused on cognitive goals. Upon arrival, patient was talking on the phone and appropriately asked the caller to call him back. Patient remained emotional throughout the session with intermittent crying has he reported gratitude for people who are praying for him and checking on him. RN reported the patient refused morning medications. SLP educated patient on what the medications were for and possible consequences of not taking medications, patient then agreeable.  SLP provided a basic money management task in which patient required Mod verbal cues for problem solving and sustained attention to task. Patient left upright in bed with alarm on and all needs within reach. Continue with current plan of care.      Pain Pain Assessment Pain Scale: 0-10 Pain Score: 0-No pain  Therapy/Group: Individual Therapy  Veatrice Eckstein 02/23/2020, 12:10 PM

## 2020-02-23 NOTE — Progress Notes (Signed)
Patient ID: Christopher Marks, male   DOB: 11/26/1990, 29 y.o.   MRN: [id number]  Frances Maywood completed Medicaid forms with patient.   [name], MSW, LCSWA Office: [phone] Cell: [phone] Fax: [phone]

## 2020-02-23 NOTE — Progress Notes (Signed)
Received verbal order per MD Riley Kill to D/C pt enteric precautions. Enteric precautions D/C at this time. Mylo Red, LPN

## 2020-02-23 NOTE — Progress Notes (Signed)
Physical Therapy Session Note  Patient Details  Name: Christopher Marks MRN: [id number] Date of Birth: 21-Apr-1990  Today's Date: 02/23/2020 PT Individual Time: 0800-0900 PT Individual Time Calculation (min): 60 min   Short Term Goals: Week 1:  PT Short Term Goal 1 (Week 1): Patient will perform bed mobility with max A +1. PT Short Term Goal 1 - Progress (Week 1): Met PT Short Term Goal 2 (Week 1): Patient will perform basic transfers with mod A +2. PT Short Term Goal 2 - Progress (Week 1): Met PT Short Term Goal 3 (Week 1): Patient will initiate w/c mobility. PT Short Term Goal 3 - Progress (Week 1): Progressing toward goal PT Short Term Goal 4 (Week 1): Patient will achieve neutral cervical rotation with AROM. PT Short Term Goal 4 - Progress (Week 1): Met Week 2:  PT Short Term Goal 1 (Week 2): Patient will perform bed mobility with mod A. PT Short Term Goal 2 (Week 2): Patient will perform basic transfers with mod A of 1 person. PT Short Term Goal 3 (Week 2): Patient will perform sit to/from stand with mod A +2. PT Short Term Goal 4 (Week 2): Patient will initaite w/c mobility. Week 3:     Skilled Therapeutic Interventions/Progress Updates:    PAIN pt c/o cervical pain w/AAROM, ROM/stretching to tolerance  Pt initially sleeping soundly, slow to awaken.  Pt c/o feeling cold, found to be incontinent of large volume of urine, clothing/brief/bed linens wet.  Nursing notified of need for assistance w/these issues. Pt supine to side to sit w/max assist.  Pt sits w/L lateral wt shift leans R w/rotation of shoulders to L, downward gaze. In sitting, worked on trunk and cervical AAROM/stretching via visual scanning activities, reaching activities, bimanual tasks, heavy tactile facilitation for cervical and trunk rotation to/past midline.   Pt tolerated 65mn sitting activities.  Nursing arrived to assist w/changing bed.  Pt became impatient waiting for assistance,  agitated and swearing at  nursing and therapist while M[place] in sitting by therapist, pt lifted from bed to allow for cleaning of mattress/change of linens.  In harness pt engaged in RLE knee extension and hamstring curls 2x15 each w/tatile and verbal cues to attend to task.   Pt lowered to clean bed.  Rolls w/max assist. For removal of pants and brief by therapist. Pt provided w/washcloth and performs bathing of perineum w/mod assist/tends to clean on L/not thouroughly on R.  Able to bridge w/mod assist for positioning of RLE, new brief/pants donned w/max assist. Pt left supine w/rails up x 4, alarm set, bed in lowest position, and needs in reach.  Pt handed off to nursing for meds at end of session.  Therapy Documentation Precautions:  Precautions Precautions: Fall Precaution Comments: R-sided hemi Restrictions Weight Bearing Restrictions: Yes RLE Weight Bearing: Non weight bearing    Therapy/Group: Individual Therapy  BCallie Fielding PDolores11/02/2020, 3:57 PM
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
## 2020-02-24 ENCOUNTER — Inpatient Hospital Stay (HOSPITAL_COMMUNITY): Payer: Self-pay | Admitting: Speech Pathology

## 2020-02-24 ENCOUNTER — Inpatient Hospital Stay (HOSPITAL_COMMUNITY): Payer: Self-pay | Admitting: Occupational Therapy

## 2020-02-24 ENCOUNTER — Inpatient Hospital Stay (HOSPITAL_COMMUNITY): Payer: Self-pay | Admitting: Physical Therapy

## 2020-02-24 LAB — CBC
HCT: 29.6 % — ABNORMAL LOW (ref 39.0–52.0)
Hemoglobin: 9.2 g/dL — ABNORMAL LOW (ref 13.0–17.0)
MCH: 25.3 pg — ABNORMAL LOW (ref 26.0–34.0)
MCHC: 31.1 g/dL (ref 30.0–36.0)
MCV: 81.3 fL (ref 80.0–100.0)
Platelets: 416 10*3/uL — ABNORMAL HIGH (ref 150–400)
RBC: 3.64 MIL/uL — ABNORMAL LOW (ref 4.22–5.81)
RDW: 15.3 % (ref 11.5–15.5)
WBC: 9.8 10*3/uL (ref 4.0–10.5)
nRBC: 0 % (ref 0.0–0.2)

## 2020-02-24 LAB — VANCOMYCIN, TROUGH
Vancomycin Tr: 43 ug/mL (ref 15–20)
Vancomycin Tr: 13 ug/mL — ABNORMAL LOW (ref 15–20)

## 2020-02-24 MED ORDER — ACETAMINOPHEN 325 MG PO TABS
650.0000 mg | ORAL_TABLET | ORAL | Status: DC | PRN
  Administered 2020-02-24 – 2020-03-08 (×5): 650 mg via ORAL
  Filled 2020-02-24 (×6): qty 2

## 2020-02-24 NOTE — Significant Event (Signed)
CRITICAL VALUE ALERT  Critical Value: Vancomycin Trough; 43  Date & Time Notied: 02/24/20 12:11  Provider Notified: Deatra Ina, PA-C  Orders Received/Actions taken: Dosage amount to be adjusted.
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
## 2020-02-24 NOTE — Progress Notes (Signed)
Patient ID: Christopher Marks, male   DOB: 1990-11-11, 28 y.o.   MRN: [id number]  SW called pt grandmother Aurea Graff [phone]) to inform on Medicaid forms being signed by patient.    [name], MSW, LCSWA Office: [phone] Cell: [phone] Fax: [phone]

## 2020-02-24 NOTE — Progress Notes (Signed)
Occupational Therapy Weekly Progress Note  Patient Details  Name: Christopher Marks MRN: [id number] Date of Birth: 11/17/1990  Beginning of progress report period: February 16, 2020 End of progress report period: February 24, 2020  Today's Date: 02/24/2020 OT Individual Time: 0905-1002 OT Individual Time Calculation (min): 57 min    Patient has met 3 of 4 short term goals.  [name] is making slow but steady progress with OT at this time.  He currently needs overall min to mod assist for UB bathing with max assist for UB dressing.  LB bathing and dressing are at max assist level sit to supine secondary to his NWBing status in the RLE.  He continues to need mod assist overall for sliding board transfers to the wheelchair with max assist for squat pivot transfers to the [facility].  A drop arm commode was placed in his room for future trial with the sliding board until his awareness and strength improve.  RUE function is still limited currently at a Brunnstrum stage IV-V level with mod assist needed to integrate the RUE into selfcare tasks for squeezing out the washcloth or when attempting to wash the LUE.[name]  Cognitively he is still around a Ranchos level V with some agitation noted but he can be redirected.  Sustained attention level is at a couple of mins or less with mod to max instructional cueing for re-direction when his neck pain has increased.  He continues to demonstrate head tilt to the right when sitting unsupported but is tolerating positioning as well as light stretching to the upper trap and levator.  [name] continues to demonstrate decreased awareness of his deficits as well as inconsistency with orientation of place, time, and situation.  Recommend continued CIR level therapy to continue progression of his cognitive deficits as well as all of his physical deficits impacting his ADL function.  Planned discharge is currently 12/2  Patient continues to demonstrate the following deficits:  muscle weakness, muscle joint tightness and muscle paralysis, impaired timing and sequencing, unbalanced muscle activation and decreased coordination, decreased attention to right, decreased initiation, decreased attention, decreased awareness, decreased problem solving, decreased safety awareness, decreased memory and delayed processing and decreased sitting balance, decreased postural control, hemiplegia and decreased balance strategies and therefore will continue to benefit from skilled OT intervention to enhance overall performance with BADL and Reduce care partner burden.  Patient progressing toward long term goals..  Continue plan of care.  OT Short Term Goals Week 2:  OT Short Term Goal 1 (Week 2): Pt will complete 2/4 steps of UB dressing OT Short Term Goal 2 (Week 2): Pt will complete UB bathing with no more than min assist and min instructional cueing to sequence for two consecutive sessions. OT Short Term Goal 3 (Week 2): Pt will complete sliding board transfer to the drop arm commode with mod assist for two consecutive trials. OT Short Term Goal 4 (Week 2): Pt will maintain sustained attention to selfcare tasks for 15 mins with no more than min instructional cueing for re-direction.  Skilled Therapeutic Interventions/Progress Updates:    Pt pleasant and overall cooperative this session.  He was in agreement to get OOB.  He was oriented to place and reason for hospitalization but was not oriented to the day of the week.  He was able to work on rolling in the bed to start in order to donn some scrub pants with max assist.  He completed rolling to the left side with mod assist for  pulling up pants on the right and then bridged with min assist for pulling up pants on the left.  He then transferred to sitting with mod assist on the left side of the bed.  Sliding board was utilized with mod assist after placement for transfer to the wheelchair.  He needed max instructional cueing to keep from  pushing through the RLE during transfer.  Once in the chair, he was taken over to the sink where he completed oral hygiene with setup.  Initially, he was confused asking "Where is my toothbrush?"  When therapist pointed it out he stated that it wasn't his toothbrush and that it was someone elses.  Therapist was able to convince him to eventually use it however and he was able to use the RUE to assist with removing toothpaste cap as well as applying toothpaste to the toothbrush.  After completion, he was rolled down to the dayroom where he initially worked on stretching his neck in lateral flexion to the left with therapist assist for intervals of 30 seconds for 5 reps.  Increased pain was reported while stretching but he was able to tolerate it better.  He then worked on reaching for clothespins from the wheelchair with the RUE and placing on the horizontal bars.  Mod assist needed when reaching up to get the clothespin from the therapist, demonstrating approximately 90 degrees shoulder flexion.  He completed 5-6 repetitions before transferring back to the room at conclusion of session.  He was left sitting up in the chair tilted back for comfort.  Nursing and NT in the room.  Call button and phone in hand with safety belt in place. RUE supported on pillow for positioning.    Therapy Documentation Precautions:  Precautions Precautions: Fall Precaution Comments: R-sided hemi Restrictions Weight Bearing Restrictions: Yes RLE Weight Bearing: Non weight bearing Pain: Pain Assessment Pain Scale: Faces Faces Pain Scale: Hurts a little bit Pain Type: Acute pain Pain Location: Neck Pain Orientation: Right Pain Descriptors / Indicators: Discomfort Pain Onset: With Activity Pain Intervention(s): Repositioned;Emotional support ADL: See Care Tool Section for some details of mobility and selfcare  Therapy/Group: Individual Therapy  Sayla Golonka OTR/L 02/24/2020, 12:55 PM
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
## 2020-02-24 NOTE — Progress Notes (Signed)
Physical Therapy Session Note  Patient Details  Name: Christopher Marks MRN: [id number] Date of Birth: June 16, 1990  Today's Date: 02/24/2020 PT Individual Time: 5035-4656 PT Individual Time Calculation (min): 72 min   Short Term Goals: Week 2:  PT Short Term Goal 1 (Week 2): Patient will perform bed mobility with mod A. PT Short Term Goal 2 (Week 2): Patient will perform basic transfers with mod A of 1 person. PT Short Term Goal 3 (Week 2): Patient will perform sit to/from stand with mod A +2. PT Short Term Goal 4 (Week 2): Patient will initaite w/c mobility.  Skilled Therapeutic Interventions/Progress Updates:    Pt received supine in bed asleep, but awakens to gentle tactile and auditory stimulus - pt agreeable to therapy session. Requires some increased time to awake fully prior to initiating mobility to EOB. While in supine, working on increasing alertness, pt reports that he has been performing active cervical stretches between therapy sessions focusing on L cervical rotation and L lateral flexion with 30 second holds - demos x 3 reps. Supine>sitting L EOB, HOB partially elevated and using bedrail, with therapist providing max cuing to perform logroll technique to increase pt independence and decrease strain on cervical musculature to avoid increased pain - with increased time and cuing pt able to come to sitting with heavy min assist. Sitting EOB with min assist initially then quickly (20seconds) progressing to close supervision for safety using R UE support. Continues to demo improving active upright, midline cervical alignment while sitting EOB but when resting returns to excessive R lateral flexion with some L cervical rotation. Lab technician in/out for blood draw attempt while pt sat EOB with close supervision for safety. Pt able to recall R LE NWBing precautions.   R lateral scoot transfer EOB>w/c using transfer board - max assist for board placement with pt able to assist with placement  via L lateral trunk lean onto forearm support and elevate R pelvis (pt able to recall placement of board when initiating transfer) - scooting with heavy mod assist for pivoting hips, therapist continues to place foot under pt's R LE to maintain NWB with pt demoing good compliance - cuing and facilitation for increased L LE WBing to assist with scooting - cuing for R hand placement for WBing during transfer - pt demos improving motor planning and sequencing of this task.  Transported to/from gym in w/c for time management and energy conservation. R lateral scoot w/c>EOM using transfer board with same assist/cuing as described with pt continuing to demo improving motor planning.   Sitting EOM focusing on trunk control and R UE NMR via the following:  - participating in boxing activity, max assist to don boxing gloves, starting with therapist providing external targets to work on speed of initiation of movement, increased trunk rotation, dual-cognitive task to mix up locations and sequences of targets then transitioned to using punching bag with pt demoing increased motor recruitment and muscle force when punching bag as opposed to therapist's boxing glove targets - able to perform all with close supervision/CGA for sitting balance while reaching only minimal distance outside BOS  - bimanual task via catching and then "dunking" light weight green ball into basketball goal - tossed ball to pt's R side to promote increased R hand incoroporation into task - had to adjust goal and provide max cuing for pt to transition to a dunking type shot to force increased incorporation of R UE as pt typically shoots L handed - with increased reps  demos improving recruitment and motor output to raise R UE with initially max progressed to min assist in order to reach ball up to goal Sit>stand EOM>3 [place] support and therapist placing foot under pt's R LE to monitor NWB compliance - +2 heavy mod assist for lifting into standing  and pt unable to maintain NWB during transition but once in standing was able to keep L weight shift - tolerated standing ~53minute with +2 min assist for static standing balance. L lateral scoot transfer EOM>w/c using transfer board with max assist for board placement and heavy mod assist for scooting hips - continued cuing and facilitation for increased L LE WBing to perform transfer and cuing for R UE involvement. Transported back to room and L lateral scoot to bed as just described. Sit>supine with mod assist for B LE management into the bed. Left supine in bed with needs in reach, RUE therapeutically positioned on pillows, and bed alarm on.   Therapy Documentation Precautions:  Precautions Precautions: Fall Precaution Comments: R-sided hemi Restrictions Weight Bearing Restrictions: Yes RLE Weight Bearing: Non weight bearing  Pain: Grimaces when assisting with R LE management up into bed at end of session but otherwise not indications or reports of pain during session.   Therapy/Group: Individual Therapy  Ginny Forth , PT, DPT, CSRS  02/24/2020, 12:13 PM

## 2020-02-24 NOTE — Progress Notes (Signed)
Vancomycin trough drawn while vancomycin dose infusing  Level of 43 is erroneous  Will redraw prior to 6pm dose  Directly spoke to RN to refrain from hanging vancomycin dose until level drawn   Leota Sauers Pharm.D. CPP, BCPS Clinical Pharmacist [phone] 02/24/2020 2:38 PM

## 2020-02-24 NOTE — Progress Notes (Signed)
Speech Language Pathology Weekly Progress and Session Note  Patient Details  Name: Christopher Marks MRN: [id number] Date of Birth: [birth year]  Beginning of progress report period: February 17, 2020 End of progress report period: February 24, 2020  Today's Date: 02/24/2020 SLP Individual Time: 1100-1155 SLP Individual Time Calculation (min): 55 min  Short Term Goals: Week 1: SLP Short Term Goal 1 (Week 1): Patient will participate in full cognitive-linguistic testing to determine baseline scores. SLP Short Term Goal 1 - Progress (Week 1): Met SLP Short Term Goal 2 (Week 1): Patient will perform basic functional ADL"s with modA for accuracy and to initiate. SLP Short Term Goal 2 - Progress (Week 1): Not met SLP Short Term Goal 3 (Week 1): Patient will consume trials of regular texture solids with SLP without significant difficulty or overt s/s of aspiration or penetration. SLP Short Term Goal 3 - Progress (Week 1): Not met SLP Short Term Goal 4 (Week 1): Patient will orient self to time/place/situation with minA cues for use of external aids as needed. SLP Short Term Goal 4 - Progress (Week 1): Met SLP Short Term Goal 5 (Week 1): Patient will express wants/needs/thoughts at sentence and conversational level with 90% intelligibility of speech with supervision A. SLP Short Term Goal 5 - Progress (Week 1): Met    New Short Term Goals: Week 2: SLP Short Term Goal 1 (Week 2): Patient will consume current diet with minimal overt s/s of aspiration and Min verbal cues for use of swallowing compensatory strategies. SLP Short Term Goal 2 (Week 2): Patient will demonstrate sustained attention to functional tasks for ~10 minutes with Min verbal cues for redirection. SLP Short Term Goal 3 (Week 2): Patient will recall new, daily information with Mod verbal and visual cues. SLP Short Term Goal 4 (Week 2): Patient will demonstrate functional problem solving for basic and familiar tasks with Mod verbal  cues. SLP Short Term Goal 5 (Week 2): Patient will self-monitor and correct errors during functional tasks with Mod verbal cues.  Weekly Progress Updates: Patient has made functional gains and haas met 3 of 5 STGs this reporting period. Currently, patient continues to demonstrate behaviors consistent with a Rancho Level VI and requires overall Max A verbal cues to complete functional and familiar tasks safely in regards to sustained attention, recall and functional problem solving. However, patient demonstrates improved initiation of functional tasks and orientation. Patient is 100% intelligible when fully awake and alert and can express his basic wants/needs appropriately. Patient is consuming Dys. 3 textures with thin liquids with minimal overt s/s of aspiration and requires Mod verbal cues for use of swallowing compensatory strategies. Trials of regular textures have not been attempted due to ongoing lethargy and poor awareness of bolus. Patient and family education ongoing. Patient would benefit from continued skilled SLP intervention to maximize his cognitive and swallowing function and overall functional independence prior to discharge.      Intensity: Minumum of 1-2 x/day, 30 to 90 minutes Frequency: 3 to 5 out of 7 days Duration/Length of Stay: 03/15/20 Treatment/Interventions: Cognitive remediation/compensation;Dysphagia/aspiration precaution training;Cueing hierarchy;Internal/external aids;Speech/Language facilitation;Functional tasks;Patient/family education;Medication managment;Environmental controls;Therapeutic Activities   Daily Session  Skilled Therapeutic Interventions: Skilled treatment session focused on cognitive and dysphagia goals. SLP facilitated session by providing Min A verbal cues for functional problem solving during a basic medication management task. Patient also participated in a medication safety task with 100% accuracy. Patient also completed a basic money management  task with extra time and Min verbal cues  for problem solving. SLP also facilitated session by providing Mod A verbal cues for use of a slow rate, small bites and attention to right buccal pocketing with lunch meal of Dys. 3 textures with thin liquids. Patient with minimal PO intake but no overt s/s of aspiration noted. Patient transferred back to bed at end of session per his request. Patient left supine in bed with alarm on and all needs within reach. Continue with current plan of care.      Pain No/Denies Pain   Therapy/Group: Individual Therapy  Lundon Verdejo, [place] 02/24/2020, 6:28 AM

## 2020-02-25 MED ORDER — SODIUM CHLORIDE 0.9 % IV SOLN
2.0000 g | INTRAVENOUS | Status: AC
  Administered 2020-02-25 – 2020-02-27 (×3): 2 g via INTRAVENOUS
  Filled 2020-02-25: qty 20
  Filled 2020-02-25 (×3): qty 2
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
## 2020-02-26 ENCOUNTER — Inpatient Hospital Stay (HOSPITAL_COMMUNITY): Payer: Self-pay | Admitting: Speech Pathology

## 2020-02-26 ENCOUNTER — Inpatient Hospital Stay (HOSPITAL_COMMUNITY): Payer: Self-pay

## 2020-02-26 LAB — CULTURE, BLOOD (ROUTINE X 2)
Culture: NO GROWTH
Culture: NO GROWTH
Special Requests: ADEQUATE

## 2020-02-26 MED ORDER — DILTIAZEM HCL 60 MG PO TABS
60.0000 mg | ORAL_TABLET | Freq: Three times a day (TID) | ORAL | Status: DC
  Administered 2020-02-26 – 2020-03-12 (×42): 60 mg via ORAL
  Filled 2020-02-26 (×44): qty 1

## 2020-02-26 NOTE — Progress Notes (Signed)
Speech Language Pathology Daily Session Note  Patient Details  Name: Christopher Marks MRN: [id number] Date of Birth: 24-Dec-1990  Today's Date: 02/26/2020 SLP Individual Time: 1115-1200 SLP Individual Time Calculation (min): 45 min  Short Term Goals: Week 2: SLP Short Term Goal 1 (Week 2): Patient will consume current diet with minimal overt s/s of aspiration and Min verbal cues for use of swallowing compensatory strategies. SLP Short Term Goal 2 (Week 2): Patient will demonstrate sustained attention to functional tasks for ~10 minutes with Min verbal cues for redirection. SLP Short Term Goal 3 (Week 2): Patient will recall new, daily information with Mod verbal and visual cues. SLP Short Term Goal 4 (Week 2): Patient will demonstrate functional problem solving for basic and familiar tasks with Mod verbal cues. SLP Short Term Goal 5 (Week 2): Patient will self-monitor and correct errors during functional tasks with Mod verbal cues.  Skilled Therapeutic Interventions:  Pt was seen for skilled ST targeting cognitive goals.  Pt was on the phone upon therapist's arrival but quickly hung up the phone in order to participate in treatment.   Pt was pleasantly interactive throughout therapy session but reported that he is eager to get home so that he can get a job and earn money for his kids' Christmas gifts.  SLP discussed pt's current limitations in light of cognitive and physical deficits and that he will likely not be ready for return to work by Christmas.  Pt then stated that if that was the case he would need to apply for disability and SLP instructed pt to direct questions of that nature to his social worker, whose name pt was able to find on the whiteboard in his room with min assist verbal cues.  Pt was able to recall his weight bearing precautions and general activities of earlier PT session with min question cues but continues to demonstrate decreased safety awareness in the moment and attempted  to get up unassisted towards the end of today's therapy session.  SLP instructed pt to wait for assistance prior to standing up and pt was agreeable to waiting for nursing assistance for transfer back to bed.  Pt was left in bed with bed alarm set and all needs within reach at the end of today's therapy session.  Continue per current plan of care.    Pain Pain Assessment Pain Scale: 0-10 Pain Score: 0-No pain  Therapy/Group: Individual Therapy  Camaya Gannett, Melanee Spry 02/26/2020, 12:35 PM
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
## 2020-02-26 NOTE — Progress Notes (Signed)
Physical Therapy Session Note  Patient Details  Name: Christopher Marks MRN: [id number] Date of Birth: [birth year]  Today's Date: 02/26/2020 PT Individual Time: 1000-1040 PT Individual Time Calculation (min): 40 min   Short Term Goals: Week 1:  PT Short Term Goal 1 (Week 1): Patient will perform bed mobility with max A +1. PT Short Term Goal 1 - Progress (Week 1): Met PT Short Term Goal 2 (Week 1): Patient will perform basic transfers with mod A +2. PT Short Term Goal 2 - Progress (Week 1): Met PT Short Term Goal 3 (Week 1): Patient will initiate w/c mobility. PT Short Term Goal 3 - Progress (Week 1): Progressing toward goal PT Short Term Goal 4 (Week 1): Patient will achieve neutral cervical rotation with AROM. PT Short Term Goal 4 - Progress (Week 1): Met Week 2:  PT Short Term Goal 1 (Week 2): Patient will perform bed mobility with mod A. PT Short Term Goal 2 (Week 2): Patient will perform basic transfers with mod A of 1 person. PT Short Term Goal 3 (Week 2): Patient will perform sit to/from stand with mod A +2. PT Short Term Goal 4 (Week 2): Patient will initaite w/c mobility.  Skilled Therapeutic Interventions/Progress Updates:   Received pt supine in bed, pt agreeable to therapy, and denied any pain during session. Session with emphasis on functional mobility/transfers, generalized strengthening, dynamic standing balance/coordinaiton, and improved activity tolerance. Pt unable to recall RLE NWB precautions stating "I can put some weight through that foot." Therapist re-educated pt on RLE NWB precautions and pt verbalized understanding. Donned bilateral ted hose and non-skid socks total A and pants with mod A. Pt transferred supine<>sitting EOB with min A and cues for logroll technique. Donned pull over shirt with mod A and pt able to maintain static sitting balance with supervision. Pt transferred sit<>stand via 3 musketeer assist with min A +2. Therapist placed foot underneath RLE to  maintain RLE NWB precautions. However pt initially placing ~25% weight through RLE onto therapist's foot. While standing pt able to maintain RLE NWB status with cues to perform marching x10 reps on RLE with min A+2 for balance. Located RW and pt transferred sit<>stand from bed with RW min A +2 for safety and performed additional x10 reps R hip flexion while maintaining RLE NWB precautions. Pt then transferred sit<>stand mod A of 1 with RW and performed x10 hops on LLE while maintaining RLE NWB precautions! Pt then transferred sit<>stand with mod A of 1 and attempted stand<>pivot transfer with RW to TIS WC. Initally pt with good adherance to RLE NWB precautions but with LOB and mild L knee buckling resulting in pt placing weight on RLE and required max A +2 to quickly pivot into WC. Noted, pt with increased difficulty when performing stand<>pivot due to confusion regarding transfer as pt then able to transfer sit<>stand with RW from TIS WC mod A of 1 and perform x10 additional hops on LLE while maintaining RLE NWB precautions. Concluded session with pt semi-reclined in TIS WC, needs within reach, and seatbelt alarm on. Therapist repositioned headrest to encourage L cervical rotation and L lateral flexion.   Therapy Documentation Precautions:  Precautions Precautions: Fall Precaution Comments: R-sided hemi Restrictions Weight Bearing Restrictions: Yes RLE Weight Bearing: Non weight bearing   Therapy/Group: Individual Therapy Alfonse Alpers PT, DPT   02/26/2020, 7:40 AM

## 2020-02-27 ENCOUNTER — Inpatient Hospital Stay (HOSPITAL_COMMUNITY): Payer: Self-pay | Admitting: Speech Pathology

## 2020-02-27 ENCOUNTER — Inpatient Hospital Stay (HOSPITAL_COMMUNITY): Payer: Self-pay

## 2020-02-27 ENCOUNTER — Inpatient Hospital Stay (HOSPITAL_COMMUNITY): Payer: Self-pay | Admitting: Occupational Therapy

## 2020-02-27 LAB — CULTURE, BLOOD (ROUTINE X 2)
Culture: NO GROWTH
Culture: NO GROWTH
Special Requests: ADEQUATE
Special Requests: ADEQUATE

## 2020-02-27 LAB — BASIC METABOLIC PANEL
Anion gap: 9 (ref 5–15)
BUN: 9 mg/dL (ref 6–20)
CO2: 22 mmol/L (ref 22–32)
Calcium: 9.4 mg/dL (ref 8.9–10.3)
Chloride: 108 mmol/L (ref 98–111)
Creatinine, Ser: 0.71 mg/dL (ref 0.61–1.24)
GFR, Estimated: >60 mL/min (ref >60)
Glucose, Bld: 86 mg/dL (ref 70–99)
Potassium: 4 mmol/L (ref 3.5–5.1)
Sodium: 139 mmol/L (ref 135–145)

## 2020-02-27 LAB — CBC
HCT: 31 % — ABNORMAL LOW (ref 39.0–52.0)
Hemoglobin: 9.5 g/dL — ABNORMAL LOW (ref 13.0–17.0)
MCH: 25.3 pg — ABNORMAL LOW (ref 26.0–34.0)
MCHC: 30.6 g/dL (ref 30.0–36.0)
MCV: 82.7 fL (ref 80.0–100.0)
Platelets: 414 10*3/uL — ABNORMAL HIGH (ref 150–400)
RBC: 3.75 MIL/uL — ABNORMAL LOW (ref 4.22–5.81)
RDW: 16.2 % — ABNORMAL HIGH (ref 11.5–15.5)
WBC: 8.8 10*3/uL (ref 4.0–10.5)
nRBC: 0.2 % (ref 0.0–0.2)

## 2020-02-27 MED ORDER — OXYCODONE HCL 5 MG PO TABS
5.0000 mg | ORAL_TABLET | ORAL | Status: DC | PRN
  Administered 2020-02-27 – 2020-02-28 (×2): 5 mg via ORAL
  Filled 2020-02-27 (×2): qty 1

## 2020-02-27 NOTE — Progress Notes (Signed)
Speech Language Pathology Daily Session Note  Patient Details  Name: Christopher Marks MRN: [id number] Date of Birth: 1990-11-20  Today's Date: 02/27/2020 SLP Individual Time: 6579-0383 SLP Individual Time Calculation (min): 55 min  Short Term Goals: Week 2: SLP Short Term Goal 1 (Week 2): Patient will consume current diet with minimal overt s/s of aspiration and Min verbal cues for use of swallowing compensatory strategies. SLP Short Term Goal 2 (Week 2): Patient will demonstrate sustained attention to functional tasks for ~10 minutes with Min verbal cues for redirection. SLP Short Term Goal 3 (Week 2): Patient will recall new, daily information with Mod verbal and visual cues. SLP Short Term Goal 4 (Week 2): Patient will demonstrate functional problem solving for basic and familiar tasks with Mod verbal cues. SLP Short Term Goal 5 (Week 2): Patient will self-monitor and correct errors during functional tasks with Mod verbal cues.  Skilled Therapeutic Interventions: Skilled treatment session focused on cognitive goals. SLP facilitated session by providing extra time and overall Mod A verbal cues for recall of procedures and complex problem solving during a novel card task. Patient demonstrated selective attention to task for ~45 minutes in a mildly distracting environment with Mod I. Patient left upright in wheelchair with alarm on and all needs within reach. Continue with current plan of care.      Pain No/Denies Pain   Therapy/Group: Individual Therapy  Mael Delap 02/27/2020, 12:52 PM

## 2020-02-27 NOTE — Progress Notes (Signed)
Physical Therapy Session Note  Patient Details  Name: Christopher Marks MRN: [id number] Date of Birth: Apr 23, 1990  Today's Date: 02/27/2020 PT Individual Time: 0900-1000 and 1200-1210 PT Individual Time Calculation: 60 min and 10 min   Short Term Goals: Week 2:  PT Short Term Goal 1 (Week 2): Patient will perform bed mobility with mod A. PT Short Term Goal 2 (Week 2): Patient will perform basic transfers with mod A of 1 person. PT Short Term Goal 3 (Week 2): Patient will perform sit to/from stand with mod A +2. PT Short Term Goal 4 (Week 2): Patient will initaite w/c mobility.  Skilled Therapeutic Interventions/Progress Updates:     Session 1: Patient in bed asleep upon PT arrival. Patient easily aroused to verbal stimulation. Patient initially reporting feeling tired this morning, but then agreeable to PT session and eager to work on mobility throughout session. Patient denied pain during session. Patient did not recall weight bearing precautions at beginning of session. Educated on weight bearing precautions and patient was able to recall 2/2 times during session when asked. Patient was appropriate and oriented throughout session, frequently reported that he wanted to do well in therapy to get home to his kids for Christmas. He did perseverate on eating the chips in his room and required education x3 about current dysphasia III diet and purpose for diet restrictions.   Therapeutic Activity: Bed Mobility: Patient performed supine to sit with CGA with use of bed rail. Provided verbal cues for rolling to his L side, supporting his R leg on his L when bringing his legs off the bed, then to push up to his L elbow then hand to come to sititng. Transfers: Patient performed a slide board transfer bed>TIS w/c to the R with min A-CGA with a second person SBA for safety and total A for board placement. Provided cues for hand placement, board placement, and head-hips relationship for proper technique and  decreased assist with transfers.  He performed squat pivot/lateral scoot transfers TIS w/c<>mat table with min A-CGA R and L. Provided cues for hand placement, weight bearing precautions for R foot, and head-hips relationship for proper technique and decreased assist with transfers.  Patient performed sit to/from stand x2 with min A-CGA in the // bars. Provided verbal cues for weight bearing precautions, forward and L weight shift, and R hand placement.  Placed patient's R foot on therapists foot to monitor maintaining NWB with R lower extremity with all transfers. On second sit to stand patient was able to hold his R foot off the ground throughout the transfer without assist.  Gait Training:  Patient ambulated 8 feet in the // bars x2 with min-mod A to facilitate L foot clearance. Ambulated with hop-to gait pattern on L using B upper extremities on the // bars. Provided demonstration of proper technique prior to each trial. Provided verbal cues for sequencing, attention to R hand as he tends to leave it behind, increased L foot clearance, and use of upper extremities to lift his foot off the ground instead of hopping to reduce impact on L foot. Placed patient's R foot on therapists to maintain NWB during first trial, patient able to hold his R foot off the ground throughout second trial.  Wheelchair Mobility:  Patient was transported in the TIS w/c with total A throughout session for energy conservation and time management.  Neuromuscular Re-ed: Patient performed the following sitting balance and R upper extremity, cervical, and R lower extremity motor control activities seated EOM: -  sitting balance focused on trunk symmetry in front of a mirror for visual feedback, initially patient with L shoulder elevation, mild R trunk rotation, and R cervical flexion; patient able to self correct body asymmetry with min cues  -reaching with R upper extremity in all direction with facilitation for elbow extension  that improved with increased repetition -pushing/pulling therapist seated on rolling stool without resistance with R arm, progress from half active range for extending his arm to full active range and increased velocity with pulling motion throughout -AROM cervical horizontal and vertical rotation R/L x8 with cues to complete full range -AROM cervical lateral tilt R/L with cues for returning to midline using visual feedback from the mirror and reduced compensation of L shoulder elevation to the L -R knee flexion/extension progressing from [facility] for full extension ROM to AROM, and no resistance AROM for full flexion throughout, encouraged increased knee flexion ROM each trial  Patient in TIS w/c at end of session with breaks locked, seat belt alarm set, and all needs within reach. Patient agreeable to sit up for speech therapy session in 30 min.  Session 2: Returned to assist patient back to the bed following speech therapy session per patient's request, passed on by SLP.  Patient in TIS w/c upon PT arrival. Patient alert and agreeable to PT session. Patient reported mild low back pain from sitting in the chair during session, RN made aware. PT provided repositioning, rest breaks, and distraction as pain interventions throughout session.   Therapeutic Activity: Bed Mobility: Patient performed sit to supine with min A for R lower extremity managment. Provided verbal cues for using his L arm for improved trunk control. Transfers: Patient performed squat pivot/lateral scoot transfer TIS w/c>bed with min A-CGA. Provided cues for hand placement, weight bearing precautions for R foot, and head-hips relationship for proper technique and decreased assist with transfers. Patient held his R foot off the floor without assist or cues during the transfer.  Patient in bed at end of session with breaks locked, bed alarm set, and all needs within reach.     Therapy Documentation Precautions:   Precautions Precautions: Fall Precaution Comments: R-sided hemi Restrictions Weight Bearing Restrictions: No RLE Weight Bearing: Non weight bearing   Therapy/Group: Individual Therapy  Shaday Rayborn L Idelle Reimann PT, DPT  02/27/2020, 4:15 PM

## 2020-02-27 NOTE — Progress Notes (Signed)
Entered pts room. He is in bed laying flat eating a roast beef sub and some chips. Pt instructed on current diet. Pt was one bite away from finishing sub so rest thrown away. Placed pt's chips from home where pt can not eat them.
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
## 2020-02-28 ENCOUNTER — Inpatient Hospital Stay (HOSPITAL_COMMUNITY): Payer: Self-pay | Admitting: Occupational Therapy

## 2020-02-28 ENCOUNTER — Inpatient Hospital Stay (HOSPITAL_COMMUNITY): Payer: Self-pay | Admitting: Physical Therapy

## 2020-02-28 ENCOUNTER — Encounter (HOSPITAL_COMMUNITY): Payer: Self-pay | Admitting: Psychology

## 2020-02-28 ENCOUNTER — Inpatient Hospital Stay (HOSPITAL_COMMUNITY): Payer: Self-pay | Admitting: Speech Pathology

## 2020-02-28 ENCOUNTER — Inpatient Hospital Stay (HOSPITAL_COMMUNITY): Payer: Self-pay

## 2020-02-28 LAB — GLUCOSE, CAPILLARY: Glucose-Capillary: 118 mg/dL — ABNORMAL HIGH (ref 70–99)

## 2020-02-28 MED ORDER — OXYCODONE HCL 5 MG PO TABS
5.0000 mg | ORAL_TABLET | Freq: Four times a day (QID) | ORAL | Status: DC | PRN
  Administered 2020-02-28 – 2020-03-01 (×3): 5 mg via ORAL
  Filled 2020-02-28 (×3): qty 1

## 2020-02-28 NOTE — Progress Notes (Signed)
Speech Language Pathology Daily Session Note  Patient Details  Name: TAJAH SCHREINER MRN: [id number] Date of Birth: Jul 16, 1990  Today's Date: 02/28/2020 SLP Individual Time: 1100-1130 SLP Individual Time Calculation (min): 30 min  Short Term Goals: Week 2: SLP Short Term Goal 1 (Week 2): Patient will consume current diet with minimal overt s/s of aspiration and Min verbal cues for use of swallowing compensatory strategies. SLP Short Term Goal 2 (Week 2): Patient will demonstrate sustained attention to functional tasks for ~10 minutes with Min verbal cues for redirection. SLP Short Term Goal 3 (Week 2): Patient will recall new, daily information with Mod verbal and visual cues. SLP Short Term Goal 4 (Week 2): Patient will demonstrate functional problem solving for basic and familiar tasks with Mod verbal cues. SLP Short Term Goal 5 (Week 2): Patient will self-monitor and correct errors during functional tasks with Mod verbal cues.  Skilled Therapeutic Interventions: Skilled treatment session focused on cognitive goals. Upon arrival, patient was asleep in bed and took more than a reasonable amount of time for arousal. With extra time, patient sat EOB with Min verbal cues for problem solving. SLP facilitated session by providing Min A verbal cues to self-monitor and correct errors during a mildly complex calendar/scheduling task. Task was unable to be completed due to time constraints. Patient transferred back to bed and left with alarm on and all needs within reach. Continue with current plan of care.      Pain Pain Assessment Pain Scale: 0-10 Pain Score: 0-No pain  Therapy/Group: Individual Therapy  Marlene Beidler 02/28/2020, 11:32 AM

## 2020-02-28 NOTE — Progress Notes (Signed)
Physical Therapy Session Note  Patient Details  Name: Christopher Marks MRN: [id number] Date of Birth: 14-Feb-1991  Today's Date: 02/28/2020 PT Individual Time: 7902-4097 PT Individual Time Calculation: 70 min  Short Term Goals: Week 2:  PT Short Term Goal 1 (Week 2): Patient will perform bed mobility with mod A. PT Short Term Goal 2 (Week 2): Patient will perform basic transfers with mod A of 1 person. PT Short Term Goal 3 (Week 2): Patient will perform sit to/from stand with mod A +2. PT Short Term Goal 4 (Week 2): Patient will initaite w/c mobility.  Skilled Therapeutic Interventions/Progress Updates:     Patient in bed eating breakfast with NT in the room upon PT arrival. Patient alert and agreeable to PT session. Patient reported 7/10 neck pain at end of session, RN made aware. PT provided repositioning, rest breaks, and distraction as pain interventions throughout session.   Therapeutic Activity: Bed Mobility: Donned pants and socks bed level with max A for time management. Patient performed supine to sit with supervision and increased time and cues for NWB with R leg with mobility. He performed sit to supine with min A-CGA for bring his R lower extremity on the bed. Provided verbal cues for bringing his knees to his chest and using his L leg to support his R while bring them up to the bed for reduced assist. Patient sat EOB with supervision to finish eating lunch and drink his coffee. Discussed d/c planning, target d/c date, and PT goals. Patient named all of his children and provided there ages. Answers seemed appropriate, but unable to confirm without family present. He washed his face with a wash cloth and doffed/donned his shirt with mod-max A and cues for hemi-technique.  Transfers: Patient performed squat pivot bed>TIS w/c and TIS w/c>mat table with min A to lift his hips and facilitate head-hips relationship. He performed sit to/from stand x4 from the mat table and stand pivot mat  table>TIS w/c with min A-CGA using the RW. Provided verbal cues and demonstration for hand placement, R foot NWB, forward weight shift, and sequencing for pivoting and use of RW during stand pivot. Patient recalled weight bearing precautions and maintained his R foot off the floor with min cues with all transfers.  Wheelchair Mobility:  Patient was transported in the TIS w/c with total A throughout session for energy conservation and time management. Retrieved manual w/c during session for patient to initiate w/c mobility with therapy later today.   Neuromuscular Re-ed: Patient performed the following standing balance and R upper extremity motor control activities: -standing reaching with R upper extremity to grasp 4 horse shoes and place them on the basket ball goal at lowest height then return them; required seated rest break in between, intermittent cues to maintain R foot off the floor, and min A for balance throughout using RW for L upper extremity support, greatest challenge with elbow extension AROM to complete task -in sitting, donned gloves with max A and cleaned 4 horse shoes, focused on reaching to full elbow extension to grab and return the horse shoe with his R hand in various directions and manipulating the horse shoe to clean it with a sani-wipe   Patient in bed at end of session with breaks locked, bed alarm set, and all needs within reach. Patient with increased fatigue throughout session. Declined sitting OOB between therapy sessions, but agreeable to participating in ST sitting EOB for improved arousal and participation, SLP made aware.    Therapy  Documentation Precautions:  Precautions Precautions: Fall Precaution Comments: R-sided hemi Restrictions Weight Bearing Restrictions: Yes RLE Weight Bearing: Non weight bearing   Therapy/Group: Individual Therapy  Jamani Bearce L Spencer Cardinal PT, DPT  02/28/2020, 4:10 PM

## 2020-02-28 NOTE — Progress Notes (Signed)
Occupational Therapy Session Note  Patient Details  Name: Christopher Marks MRN: [id number] Date of Birth: 1991-02-16  Today's Date: 02/28/2020 OT Individual Time: 5465-6812 OT Individual Time Calculation (min): 71 min    Short Term Goals: Week 2:  OT Short Term Goal 1 (Week 2): Pt will complete 2/4 steps of UB dressing OT Short Term Goal 2 (Week 2): Pt will complete UB bathing with no more than min assist and min instructional cueing to sequence for two consecutive sessions. OT Short Term Goal 3 (Week 2): Pt will complete sliding board transfer to the drop arm commode with mod assist for two consecutive trials. OT Short Term Goal 4 (Week 2): Pt will maintain sustained attention to selfcare tasks for 15 mins with no more than min instructional cueing for re-direction.  Skilled Therapeutic Interventions/Progress Updates:  Patient met lying supine in bed asleep. Increased coaxing/encouragment to arrouse. Patient reporting 0/10 pain at rest and with activity. Patient able to roll to L side-lying and come to sitting position at EOB with Min guard. Education on manual wc with patient in agreement with trial this afternoon. Patient able to recall RLE weightbearing precautions and demonstrates good adherence with squat-pivot transfer to manual wc on R requiring Min A. Patient initially with hips forward requiring assist and multimodal cues for posterior scoot. Seated at sink level, patient completed 2/3 grooming tasks with set-up assist minimal cues for attention to task. Time needed to retrieve leg rests from equipment room. Patient education on locking/unlcoking breaks and self-propulsion of wc initially with BUE. Difficulty with sequencing, motor planning, maintaining grip with RUE, and wrist/digit/shoulder extension. Patient then educated on use of hemi technique demonstrating more success. Patient able to propel wc forward with use of LUE but difficulty steering with LLE 2/2 height of wc. Education to  continue in subsequent sessions. Seated at high/low table in therapy gym patient engaged in GMC/FMC and bimanual manipulation with use of bolts/nuts/washers. Patient able to recall aspects of his job prior to admission with noted desire to return to work when able. Total A for wc transport back to room and Min A for squat-pivot transfer to [place] wc. Session concluded with patient seated in TIS wc with call bell within reach, chair alarm activated, and all needs met.   Therapy Documentation Precautions:  Precautions Precautions: Fall Precaution Comments: R-sided hemi Restrictions Weight Bearing Restrictions: Yes RLE Weight Bearing: Non weight bearing General:    Therapy/Group: Individual Therapy  Christopher Marks 02/28/2020, 7:29 AM

## 2020-02-28 NOTE — Plan of Care (Signed)
  Problem: RH Balance Goal: LTG Patient will maintain dynamic standing balance (PT) Description: LTG:  Patient will maintain dynamic standing balance with assistance during mobility activities (PT) Flowsheets (Taken 02/28/2020 0547) LTG: Pt will maintain dynamic standing balance during mobility activities with:: (upgraded goal due to increased trunk control, arousal, and awareness with mobility.) Contact Guard/Touching assist   Problem: Sit to Stand Goal: LTG:  Patient will perform sit to stand with assistance level (PT) Description: LTG:  Patient will perform sit to stand with assistance level (PT) Flowsheets (Taken 02/28/2020 0547) LTG: PT will perform sit to stand in preparation for functional mobility with assistance level: (upgraded goal due to increased trunk control, arousal, and awareness with mobility.) Minimal Assistance - Patient > 75% Note: upgraded goal due to increased trunk control, arousal, and awareness with mobility.   Problem: RH Bed Mobility Goal: LTG Patient will perform bed mobility with assist (PT) Description: LTG: Patient will perform bed mobility with assistance, with/without cues (PT). Flowsheets (Taken 02/28/2020 0547) LTG: Pt will perform bed mobility with assistance level of: (upgraded goal due to increased trunk control, arousal, and awareness with mobility.) Supervision/Verbal cueing Note: upgraded goal due to increased trunk control, arousal, and awareness with mobility.   Problem: RH Bed to Chair Transfers Goal: LTG Patient will perform bed/chair transfers w/assist (PT) Description: LTG: Patient will perform bed to chair transfers with assistance (PT). Flowsheets (Taken 02/28/2020 0547) LTG: Pt will perform Bed to Chair Transfers with assistance level: (upgraded goal due to increased trunk control, arousal, and awareness with mobility.) Supervision/Verbal cueing Note: upgraded goal due to increased trunk control, arousal, and awareness with mobility.    Problem: RH Car Transfers Goal: LTG Patient will perform car transfers with assist (PT) Description: LTG: Patient will perform car transfers with assistance (PT). Flowsheets (Taken 02/28/2020 0547) LTG: Pt will perform car transfers with assist:: (upgraded goal due to increased trunk control, arousal, and awareness with mobility.) Minimal Assistance - Patient > 75% Note: upgraded goal due to increased trunk control, arousal, and awareness with mobility.   Problem: RH Attention Goal: LTG Patient will demonstrate this level of attention during functional activites (PT) Description: LTG:  Patient will demonstrate this level of attention during functional activites (PT) Flowsheets (Taken 02/28/2020 0547) Patient will demonstrate this level of attention during functional activites: Sustained Patient will demonstrate above attention level in the following environment: Controlled LTG: Patient will demonstrate attention during functional mobility with assistance of: (upgraded goal due to increased arousal and awareness with mobility.) Minimal Assistance - Patient > 75% Note: upgraded goal due to increased arousal and awareness with mobility.   Problem: RH Ambulation Goal: LTG Patient will ambulate in controlled environment (PT) Description: LTG: Patient will ambulate in a controlled environment, # of feet with assistance (PT). Flowsheets (Taken 02/28/2020 0552) LTG: Pt will ambulate in controlled environ  assist needed:: Minimal Assistance - Patient > 75% LTG: Ambulation distance in controlled environment: 25 ft using LRAD and maintaining R lower extremity NWB.

## 2020-02-28 NOTE — Progress Notes (Signed)
Physical Therapy Session Note  Patient Details  Name: Christopher Marks MRN: [id number] Date of Birth: [birth year]  Today's Date: 02/28/2020 PT Individual Time: 4967-5916 PT Individual Time Calculation (min): 27 min   Short Term Goals: Week 2:  PT Short Term Goal 1 (Week 2): Patient will perform bed mobility with mod A. PT Short Term Goal 2 (Week 2): Patient will perform basic transfers with mod A of 1 person. PT Short Term Goal 3 (Week 2): Patient will perform sit to/from stand with mod A +2. PT Short Term Goal 4 (Week 2): Patient will initaite w/c mobility.  Skilled Therapeutic Interventions/Progress Updates:    Pt received supine in bed and agreeable to therapy session. Supine>sitting L EOB, HOB flat but using bedrails, with CGA for safety as pt sliding close to EOB. With question cuing pt able to recall R LE NWBing precaution. Sit>stand EOB>RW with min/mod assist for lifting into standing and cuing for R LE NWB - pt demos good compliance once in standing but does have some WBing during the transition. R stand pivot EOB>w/c using RW with min assist for balance and cuing for sequencing of AD and pivoting on L foot - along with continued cuing for R LE NWBing - +2 present for safety.  Transported to/from gym in w/c for time management and energy conservation. Performed sitting balance and R UE NMR using the BITZ system: first setting visual scanning complex array to touch capital letters in ascending order progressed to rotator sequencing with pt tapping slow moving circle targets - pt requires max manual facilitation for R UE reaching - required mod cuing to recall sequencing of the tasks. At end of session pt agreeable to remain seated in w/c for meal with encouragement - left with needs in reach, seat belt alarm on, and R UE therapeutically supported.  Therapy Documentation Precautions:  Precautions Precautions: Fall Precaution Comments: R-sided hemi Restrictions Weight Bearing  Restrictions: Yes RLE Weight Bearing: Non weight bearing  Pain: Reports R UE becomes "sore" during the NMR tasks - provided rest breaks for pain management.  Therapy/Group: Individual Therapy  Ginny Forth , PT, DPT, CSRS  02/28/2020, 5:58 PM

## 2020-02-28 NOTE — Patient Care Conference (Signed)
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 Sula Soda Care Coodinator Present: [name], LCSWA;Milah Recht Marlyne Beards, RN, BSN, CRRN Nurse Present: Adam Phenix, LPN PT Present: Serina Cowper, PT OT Present: Jake Shark, OT SLP Present: Feliberto Gottron, SLP PPS Coordinator present : Edson Snowball, Park Breed, SLP     Current Status/Progress Goal Weekly Team Focus  Bowel/Bladder   Pt is continent/incontinent of bowel/bladder. He mostly knows when he needs to void or have BM but has accidents at times. LBM-11/14  To become more continent of bowel and bladder.  Assess tolieting needs q2 if needed.   Swallow/Nutrition/ Hydration   Dys. 3 textures with thin liquids, Min A  Mod I  increase use of swallowing strategies, trials of regular textures   ADL's   Min assist for supine to sit with min assist for UB selfcare and max assist for LB selfcare sit to supine.  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  Needs min to mod assist to integrate into function.  Mod assist for squat pivot transfers to drop arm commode.  Poor awareness of situation and weightbearing precautions.  min A overall  RUE NMR, cognitive retraining, transfer training, balance retraining, pt/family education   Mobility   CGA-min A bed mobility and  transfers, ambulated 8 feet in parallel bars x2 with mod A maintaining NWB on R lower extremity  Min A-CGA overall, gait 25 ft min A (upgraded today due to patient's progress)  R upper/lower extremity NMR, sitting and standing balance/tolerance, activity tolerance, R lower extremity ROM, R attention, safety awareness, adhearance to precautions, functional mobility, gait and w/c training, patient/caregiver education   Communication             Safety/Cognition/ Behavioral Observations  Mod A with complex tasks  Supervision  sustained attention, functional problem solving, recall and emergent awareness   Pain   Has headaches at times, he can get prn oxy q4 if needed.  To keep pain level <2/10.  Assess pain q shift or prn.   Skin   Abrasion to left arm, and healing surigical incision to right lower leg.  Prevent skin breakdown from occurring and promote healing.  Assess skin q shift or prn     Discharge Planning:  Pt is uninsured. D/c to grandmother's home who will be primary caregiver. Assistance from grandmother and girlfriend.   Team Discussion: Continent B/B with incontinent episodes, headache at times, incision to leg healing well. OT reports patient is min assist for upper body ADL's, max assist lower body ADL's, mod assist squat pivot. Goals are set at min assist. PT reports patient is min assist for transfers, maintains precautions with cueing. Goals are set at min assist. SLP reports patient is making progress, may argue at times but will eventually agree. Patient on target to meet  rehab goals: yes  *See Care Plan and progress notes for long and short-term goals.   Revisions to Treatment Plan:  None at this time.  Teaching Needs: Continue with family education, weight bearing precautions  Current Barriers to Discharge: Inaccessible home environment, Decreased caregiver support, Home enviroment access/layout, Wound care, Weight bearing restrictions, Medication compliance and  Behavior  Possible Resolutions to Barriers: Continue education on weight bearing precautions, safety awareness, continue current medication regimen, and education on wound care and dressing changes.     Medical Summary Current Status: Argumentative, neck pain, anemia, more agreeable overall, dysphagia  Barriers to Discharge: Medical stability;Behavior;Decreased family/caregiver support  Barriers to Discharge Comments: Argumentative, neck pain, his grandmother does not have transportation, anemia, dysphagia Possible Resolutions to [employer] Focus: Continued pain medications, add kpad, schedule caregiver training with grandma, monitor hemoglobin weekly, D3 diet with continued SLP   Continued Need for Acute Rehabilitation Level of Care: The patient requires daily medical management by a physician with specialized training in physical medicine and rehabilitation for the following reasons: Direction of a multidisciplinary physical rehabilitation program to maximize functional independence : Yes Medical management of patient stability for increased activity during participation in an intensive rehabilitation regime.: Yes Analysis of laboratory values and/or radiology reports with any subsequent need for medication adjustment and/or medical intervention. : Yes   I attest that I was present, lead the team conference, and concur with the assessment and plan of the team.   Tennis Must 02/28/2020, 1:22 PM
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
## 2020-02-29 ENCOUNTER — Inpatient Hospital Stay (HOSPITAL_COMMUNITY): Payer: Self-pay

## 2020-02-29 ENCOUNTER — Inpatient Hospital Stay (HOSPITAL_COMMUNITY): Payer: Self-pay | Admitting: Speech Pathology

## 2020-02-29 ENCOUNTER — Inpatient Hospital Stay (HOSPITAL_COMMUNITY): Payer: Self-pay | Admitting: Occupational Therapy

## 2020-02-29 MED ORDER — CHLORDIAZEPOXIDE HCL 25 MG PO CAPS
25.0000 mg | ORAL_CAPSULE | Freq: Every day | ORAL | Status: DC
  Administered 2020-03-01 – 2020-03-05 (×5): 25 mg via ORAL
  Filled 2020-02-29 (×5): qty 1

## 2020-02-29 NOTE — Progress Notes (Addendum)
Occupational Therapy Session Note  Patient Details  Name: Christopher Marks MRN: [id number] Date of Birth: 09-20-1990  Today's Date: 02/29/2020 OT Individual Time: 1305-1404 OT Individual Time Calculation (min): 59 min    Short Term Goals: Week 2:  OT Short Term Goal 1 (Week 2): Pt will complete 2/4 steps of UB dressing OT Short Term Goal 2 (Week 2): Pt will complete UB bathing with no more than min assist and min instructional cueing to sequence for two consecutive sessions. OT Short Term Goal 3 (Week 2): Pt will complete sliding board transfer to the drop arm commode with mod assist for two consecutive trials. OT Short Term Goal 4 (Week 2): Pt will maintain sustained attention to selfcare tasks for 15 mins with no more than min instructional cueing for re-direction.  Skilled Therapeutic Interventions/Progress Updates:    Pt in bed to start session, agreeable to participation in OT session.  He was initially not oriented to the day of the week but with use of the external calendar and mod questioning cueing he was able to state it was Wednesday.  He was able to transfer to the EOB with supervision and then worked on donning a pullover scrub top with overall min assist and mod instructional cueing for hemi technique.  He then completed squat pivot transfer to the wheelchair at mod assist level with mod instructional cueing and therapist assist to keep the RLE in NWBing.  He continues to try and place weight through the RLE without good awareness that he is supposed to be NWBing.  He was then taken down to the therapy gym where he was agreeable to working on RUE strengthening with use of the UE ergonometer.  He was able to complete 3 mins initially using BUEs and resistance on level 5.  He was able to maintain sustained grip in the right hand on the handle for approximately 30 seconds or slightly more at a time.  After a brief rest, he proceeded to complete 2 more sets of 3 mins using the isolated RUE  only.  He was unable to maintain grip with isolated use, so ace bandage was utilized for sustained grip in order to focus on shoulder strengthening.  With peddling forward for the first set, he was able to complete with min guard assist.  Therapist occasionally provided cueing for decreased shoulder hike and to avoid leaning to the left. On the second set he needed mod assist for peddling in reverse secondary to greater demand on the shoulder.  Both sets completed in isolation were on level one resistance.  While working on UE strengthening pt was instructed on the amount of time he would be peddling for each set.  He was able to recognize when three mins were up but needed mod questioning cueing from therapist.  Returned to the room at the end of the session via wheelchair with pt recalling his room number "09", but was not able to recall "4W".  He completed squat pivot transfer to the bed from the wheelchair with mod assist and then transitioned to supine with min guard assist.  Call button and phone in reach with safety alarm in place.  Nursing made aware of pt's request for something to drink.    Therapy Documentation Precautions:  Precautions Precautions: Fall Precaution Comments: R-sided hemi Restrictions Weight Bearing Restrictions: Yes RLE Weight Bearing: Non weight bearing  Pain: Pain Assessment Pain Scale: Faces Faces Pain Scale: No hurt ADL: See Care Tool Section for some details  of mobility and selfcare  Therapy/Group: Individual Therapy  Paul Torpey OTR/L 02/29/2020, 4:05 PM

## 2020-02-29 NOTE — Progress Notes (Signed)
Patient ID: [name], male   DOB: 1991/02/10, 29 y.o.   MRN: [id number]  SW called pt grandmother Christopher Marks [phone]) to provide updates from team conference on gains made. SW discussed the importance of family education. Continues to report she does not have transportation which makes things challenging, and also watching her 14 mos old grandson. She states she is free for family education on Tues 1pm-3pm. SW to follow-up on Monday to confirm she will have transportation, and SW will explore resources for her as well.   [name], MSW, LCSWA Office: [phone] Cell: [phone] Fax: [phone]

## 2020-02-29 NOTE — Progress Notes (Signed)
Physical Therapy Session Note  Patient Details  Name: Christopher Marks MRN: [id number] Date of Birth: 1990/09/25  Today's Date: 02/29/2020 PT Individual Time: 4098-1191 and 800-900 PT Individual Time Calculation (min): 30 min and 62mn  Short Term Goals: Week 1:  PT Short Term Goal 1 (Week 1): Patient will perform bed mobility with max A +1. PT Short Term Goal 1 - Progress (Week 1): Met PT Short Term Goal 2 (Week 1): Patient will perform basic transfers with mod A +2. PT Short Term Goal 2 - Progress (Week 1): Met PT Short Term Goal 3 (Week 1): Patient will initiate w/c mobility. PT Short Term Goal 3 - Progress (Week 1): Progressing toward goal PT Short Term Goal 4 (Week 1): Patient will achieve neutral cervical rotation with AROM. PT Short Term Goal 4 - Progress (Week 1): Met Week 2:  PT Short Term Goal 1 (Week 2): Patient will perform bed mobility with mod A. PT Short Term Goal 2 (Week 2): Patient will perform basic transfers with mod A of 1 person. PT Short Term Goal 3 (Week 2): Patient will perform sit to/from stand with mod A +2. PT Short Term Goal 4 (Week 2): Patient will initaite w/c mobility. Week 3:     Skilled Therapeutic Interventions/Progress Updates:    .AM SESSION PAIN  DENIES PAIN THIS AM INITIALLY, states he is tired and hungry as he has not had breakfast.  Later in session c/o 10/10 headach, nursing provided meds, treatment to tolerance.  Pt supine to sit on edge of bed w/cues for NWB/tries to wb w/scooting/bridge position, cga, use of rail Slow w/side to sit but completes w/cga only. Lateral scoot bed to wc w/cues for NWBing on RLE/therapist monitors w/foot under foot, min assist, cues, and set up assist. Once sitting pt able to feed self breakfast w/supervison.[id number]   STS w/therapist elevating RLE to minimize wbing w/transition, does put some wt on limb w/STS but limited by positioning.   Standing in parallel bars visual scanning to R to locate numbers 1-10 posted  randomly on mirror to R, Pt then able to point towards numbers w/RUE, min cues to attend and keep track of where he is in count. Maintains Nwbing w/therapist elevating limb/positioned ahead of pt.  Gait 824fx 2 in parallel bars NWBing on RLE w/cues to increase use of UEs to offload LLE, maintains wbing well, wc follow/sits suddenly first pass.  Required 5-6 min for recovery between efforts.  while resting - pt presented w/written simple math problems, scans to R to read problem aloud, simple addition w/ease, multiplication difficult/uncertain baseline knowledge w/this, difficulty w/double digit addition.  wc propulsion: Mod assist to maintain straight path using LUE/LLE, decreased ability to use LLE to steer wc cue to seat height + R inattention x 6080fSquat pivot wc to bed w mod assist, NWBing RLE/cues Sit to supine w/light mod assist for R exts. Handed off to nursing for admin of meds.  PM SESSION PAIN States headache but unable to quantify, inconsistent complaints, treatent to tolerance.  Pt initially supine and aggravated by phone ringing and difficulty operating call feature on his tablet. Once connected, did tell girlfriend his "therapist is here". Supine to sit w/cues for NWB of RLE. Attempted Sit to stand w/RW/training w/stand pivot transfer, but pt immediately attempts to stand wbing thru RLE therefore defaulted to squat pivot.  Performs w/min assist and maintains NWBing w/tactile and verbal cues. wc propulsion w/bilat UEs x 68f21fmax cues /hand over hand  facilitation of RUE, overpowers w/LUE and unable to self correct/poor problem solving ability.  Pt transported to gym Sit to stand in parallel bars w/therapist elevating RLE, use of bars, min assist. Gait 6f, turns at 870f maintains NWB during gait w/verbal cues, w/turning w/physical assist from therapist to prevent wbing due to decreased attention to task.  Attempts to sit unsafetly when 1/4 turned to chair, max cues and mod  assist for safety. Repeated gait as above x 2.  Pt returned to room  Therapist demonstrated short distance gait and turning to sit on edge of bed w/RW.  Pt repeats "I already know what the fuck you want me to do!" Yet attempts to stand without regard to wbing precautions.  Sit to stand from wc, pulls up on walker, therapist elevates RLE to prevent wbing. Gait 76f72f/RW, turns and sits on edge of bed w/max cues for safety and wbing but does maintain wbing and fully turn. Sit to supine w/min assist.  Pt annoyed w/returning to bed.  Swearing about his back being stiff and always being contained to bed.  Able to redirect fairly easily despite short bouts of agitation. Pt left supine w/rails up x 4, alarm set, bed in lowest position, and needs in reach.    Therapy Documentation Precautions:  Precautions Precautions: Fall Precaution Comments: R-sided hemi Restrictions Weight Bearing Restrictions: Yes RLE Weight Bearing: Non weight bearing    Therapy/Group: Individual Therapy  BarCallie FieldingT Pettis/17/2021, 3:48 PM
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
## 2020-02-29 NOTE — Progress Notes (Signed)
Speech Language Pathology Daily Session Note  Patient Details  Name: Christopher Marks MRN: [id number] Date of Birth: Jan 07, 1991  Today's Date: 02/29/2020 SLP Individual Time: 1000-1045 SLP Individual Time Calculation (min): 45 min  Short Term Goals: Week 2: SLP Short Term Goal 1 (Week 2): Patient will consume current diet with minimal overt s/s of aspiration and Min verbal cues for use of swallowing compensatory strategies. SLP Short Term Goal 2 (Week 2): Patient will demonstrate sustained attention to functional tasks for ~10 minutes with Min verbal cues for redirection. SLP Short Term Goal 3 (Week 2): Patient will recall new, daily information with Mod verbal and visual cues. SLP Short Term Goal 4 (Week 2): Patient will demonstrate functional problem solving for basic and familiar tasks with Mod verbal cues. SLP Short Term Goal 5 (Week 2): Patient will self-monitor and correct errors during functional tasks with Mod verbal cues.  Skilled Therapeutic Interventions: Skilled treatment session focused on cognitive goals. Upon arrival, patient was slow to arouse but was eventually agreeable to participate. SLP facilitated session by providing Min A verbal cues for organization and problem solving during a mildly complex calendar organization task. However, patient was overall Mod I for attention to task in a mildly distracting environment for ~20 minutes.  Patient initiated a medication management task in which patient required Max A verbal cues for recall of his current medications and their functions with goals to organize a QD pill box during the next session. Patient transferred back to bed at end of session with Min verbal cues for safety with task. Patient left supine in bed with alarm on and all needs within reach. Continue with current plan of care.      Pain No/Denies Pain   Therapy/Group: Individual Therapy  Whalen Trompeter 02/29/2020, 2:37 PM
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
## 2020-03-01 ENCOUNTER — Inpatient Hospital Stay (HOSPITAL_COMMUNITY): Payer: Self-pay | Admitting: Speech Pathology

## 2020-03-01 ENCOUNTER — Inpatient Hospital Stay (HOSPITAL_COMMUNITY): Payer: Self-pay

## 2020-03-01 MED ORDER — OXYCODONE HCL 5 MG PO TABS
5.0000 mg | ORAL_TABLET | Freq: Three times a day (TID) | ORAL | Status: DC | PRN
  Administered 2020-03-02 – 2020-03-12 (×11): 5 mg via ORAL
  Filled 2020-03-01 (×12): qty 1

## 2020-03-01 NOTE — Progress Notes (Signed)
Occupational Therapy Session Note  Patient Details  Name: Christopher Marks MRN: [id number] Date of Birth: 02/17/1991  Today's Date: 03/01/2020 OT Individual Time: 1400-1515 OT Individual Time Calculation (min): 75 min    Short Term Goals: Week 1:  OT Short Term Goal 1 (Week 1): Pt will sit EOB wiht MOD A of 1 caregiver in prep for seated toileitng OT Short Term Goal 1 - Progress (Week 1): Met OT Short Term Goal 2 (Week 1): Pt will transfer wiht 1 caregiver and LRAD to w/c in prep for toilet transfer OT Short Term Goal 2 - Progress (Week 1): Met OT Short Term Goal 3 (Week 1): Pt will complete 2/4 steps of UB dressing OT Short Term Goal 3 - Progress (Week 1): Not met OT Short Term Goal 4 (Week 1): Pt will locate 2/3 ADL items R of midline at sink OT Short Term Goal 4 - Progress (Week 1): Met  Skilled Therapeutic Interventions/Progress Updates:    1:1. Pt received in bed agreeable to OT and requires increased time to arouse. Pt reporting medication impacting arousal. Pt told time and states, "let me get my butt up out of this bed." Pt completes all transfers via squat pivot or stand pivot in B directions with pt R foot under OT foot to monitor WB status with MIN intermittent WB throughout. Pt completes toilet and shower transfer using grab bar in B directions with MIN-MOD A overall and VC for maintaining precautions. Pt completes grooming, lotion and deodorant application at sink with S. Pt completes UB dressing with MOD A and LB dressing with MMAX A overall for doffing and donning throughout. Pt able to follow hemi cuing with increased time.  Box and blocks (MOD VC for rule following): RUE 17 LUE 37 Dynamometer: RUE 33 avg over 3 trials LUE 20 avg over 3 trials   Exited session with pt seated in bed, exit alarm on and call light in reahc  Therapy Documentation Precautions:  Precautions Precautions: Fall Precaution Comments: R-sided hemi Restrictions Weight Bearing Restrictions:  No RLE Weight Bearing: Non weight bearing General:   Vital Signs: Therapy Vitals Temp: 98.5 F (36.9 C) Pulse Rate: 88 BP: 122/73 Patient Position (if appropriate): Lying Oxygen Therapy SpO2: 100 % O2 Device: Room Air Pain:   ADL: ADL Eating: Maximal assistance Grooming: Maximal assistance Where Assessed-Grooming: Bed level (suction d/t arousal) Upper Body Bathing: Maximal assistance Where Assessed-Upper Body Bathing: Bed level Lower Body Bathing: Dependent Where Assessed-Lower Body Bathing: Bed level Upper Body Dressing: Maximal assistance Where Assessed-Upper Body Dressing: Bed level Lower Body Dressing: Dependent Where Assessed-Lower Body Dressing: Bed level Toileting: Unable to assess Toilet Transfer Method: Unable to assess Vision   Perception    Praxis   Exercises:   Other Treatments:     Therapy/Group: Individual Therapy  Tonny Branch 03/01/2020, 8:05 AM

## 2020-03-01 NOTE — Plan of Care (Signed)
  Problem: Consults Goal: RH BRAIN INJURY PATIENT EDUCATION Description: Description: See Patient Education module for eduction specifics Outcome: Progressing Goal: Skin Care Protocol Initiated - if Braden Score 18 or less Description: If consults are not indicated, leave blank or document N/A Outcome: Progressing   Problem: RH BLADDER ELIMINATION Goal: RH STG MANAGE BLADDER WITH ASSISTANCE Description: STG Manage Bladder With mod Assistance Outcome: Progressing Goal: RH STG MANAGE BLADDER WITH MEDICATION WITH ASSISTANCE Description: STG Manage Bladder With Medication With mod Assistance. Outcome: Progressing   Problem: RH SKIN INTEGRITY Goal: RH STG SKIN FREE OF INFECTION/BREAKDOWN Description: Skin to remain free from additional breakdown while on rehab with mod assistance. Outcome: Progressing Goal: RH STG MAINTAIN SKIN INTEGRITY WITH ASSISTANCE Description: STG Maintain Skin Integrity With mod Assistance. Outcome: Progressing Goal: RH STG ABLE TO PERFORM INCISION/WOUND CARE W/ASSISTANCE Description: STG Able To Perform Incision/Wound Care With mod Assistance. Outcome: Progressing   Problem: RH SAFETY Goal: RH STG ADHERE TO SAFETY PRECAUTIONS W/ASSISTANCE/DEVICE Description: STG Adhere to Safety Precautions With mod Assistance/Device. Outcome: Progressing Goal: RH STG DECREASED RISK OF FALL WITH ASSISTANCE Description: STG Decreased Risk of Fall With mod Assistance. Outcome: Progressing   Problem: RH COGNITION-NURSING Goal: RH STG USES MEMORY AIDS/STRATEGIES W/ASSIST TO PROBLEM SOLVE Description: STG Uses Memory Aids/Strategies With mod Assistance to Problem Solve. Outcome: Progressing Goal: RH STG ANTICIPATES NEEDS/CALLS FOR ASSIST W/ASSIST/CUES Description: STG Anticipates Needs/Calls for Assist With mod Assistance/Cues. Outcome: Progressing   Problem: RH PAIN MANAGEMENT Goal: RH STG PAIN MANAGED AT OR BELOW PT'S PAIN GOAL Description: <4 on a 0-10 pain  scale. Outcome: Progressing   Problem: RH KNOWLEDGE DEFICIT BRAIN INJURY Goal: RH STG INCREASE KNOWLEDGE OF SELF CARE AFTER BRAIN INJURY Description: Patient will demonstrate knowledge of medication management, weight bearing precautions, skin and wound care, safety precautions, and follow up care with the MD post discharge wit min assist from CIR staff. Outcome: Progressing

## 2020-03-01 NOTE — Progress Notes (Signed)
Speech Language Pathology Daily Session Note  Patient Details  Name: Christopher Marks MRN: [id number] Date of Birth: Oct 09, 1990  Today's Date: 03/01/2020 SLP Individual Time: 1100-1153 SLP Individual Time Calculation (min): 53 min  Short Term Goals: Week 2: SLP Short Term Goal 1 (Week 2): Patient will consume current diet with minimal overt s/s of aspiration and Min verbal cues for use of swallowing compensatory strategies. SLP Short Term Goal 2 (Week 2): Patient will demonstrate sustained attention to functional tasks for ~10 minutes with Min verbal cues for redirection. SLP Short Term Goal 3 (Week 2): Patient will recall new, daily information with Mod verbal and visual cues. SLP Short Term Goal 4 (Week 2): Patient will demonstrate functional problem solving for basic and familiar tasks with Mod verbal cues. SLP Short Term Goal 5 (Week 2): Patient will self-monitor and correct errors during functional tasks with Mod verbal cues.  Skilled Therapeutic Interventions: Skilled treatment session focused on cognitive goals. Upon arrival, patient was talking on the telephone but appeared lethargic. Patient reporting neck pain, RN aware, patient pre-medicated and repositioned. SLP facilitated session by providing Mod A verbal cues for sustained attention, functional problem solving and error awareness during a complex medication management task.  Suspect patient's function impacted by pain and lethargy today. Task will be completed during next session. Patient left supine in bed with alarm on and all needs within reach. Continue with current plan of care.      Pain Pain Assessment Pain Scale: 0-10 Pain Score: 9  Pain Type: Acute pain Pain Location: Neck Pain Orientation: Right;Left Pain Descriptors / Indicators: Throbbing;Discomfort Pain Onset: On-going Patients Stated Pain Goal: 2 Pain Intervention(s): Repositioned;RN made aware (patient pre-medicated) Multiple Pain Sites:  No  Therapy/Group: Individual Therapy  Dodger Sinning 03/01/2020, 12:04 PM

## 2020-03-01 NOTE — Plan of Care (Signed)
°  Problem: Sit to Stand Goal: LTG:  Patient will perform sit to stand with assistance level (PT) Description: LTG:  Patient will perform sit to stand with assistance level (PT) Flowsheets (Taken 03/01/2020 0656) LTG: PT will perform sit to stand in preparation for functional mobility with assistance level: (Upgraded goal due to patient's progress with strength/balance with functional transfers.) Contact Guard/Touching assist Note: Upgraded goal due to patient's progress with strength/balance with functional transfers.
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
## 2020-03-01 NOTE — Progress Notes (Signed)
Physical Therapy Weekly Progress Note  Patient Details  Name: Christopher Marks MRN: [id number] Date of Birth: 1990-07-05  Beginning of progress report period: February 23, 2020 End of progress report period: March 01, 2020  Today's Date: 03/01/2020 PT Individual Time: 0920-1020 PT Individual Time Calculation: 60 min  Patient has met 4 of 4 short term goals.  Patient made great progress this week. He currently performs bed mobility and transfers with min A, is ambulating 15 ft in // bars, and performs w/c propulsion up to 180 feet with min A using hemi-technique. He requires mod-max verbal, and tactile cues to maintain weight bearing precautions with all mobility. Patient displays decreased attention, poor frustration control, and decreased awareness and incite into his deficits at this time.  Patient continues to demonstrate the following deficits muscle weakness and muscle joint tightness, decreased cardiorespiratoy endurance, abnormal tone, decreased coordination and decreased motor planning, decreased attention to right, decreased attention, decreased awareness, decreased problem solving and decreased safety awareness and decreased sitting balance, decreased standing balance, decreased postural control, hemiplegia, decreased balance strategies and difficulty maintaining precautions and therefore will continue to benefit from skilled PT intervention to increase functional independence with mobility.  Patient progressing toward long term goals..  Plan of care revisions: upgraded sit to stand goal due to patient's progress with mobility, see Care Plan..  PT Short Term Goals Week 2:  PT Short Term Goal 1 (Week 2): Patient will perform bed mobility with mod A. PT Short Term Goal 1 - Progress (Week 2): Met PT Short Term Goal 2 (Week 2): Patient will perform basic transfers with mod A of 1 person. PT Short Term Goal 2 - Progress (Week 2): Met PT Short Term Goal 3 (Week 2): Patient will perform  sit to/from stand with mod A +2. PT Short Term Goal 3 - Progress (Week 2): Met PT Short Term Goal 4 (Week 2): Patient will initaite w/c mobility. PT Short Term Goal 4 - Progress (Week 2): Met Week 3:  PT Short Term Goal 1 (Week 3): Patient will perform bed mobility with supervision consistently. PT Short Term Goal 2 (Week 3): Patient will perform basic transfers with CGA maintaining NWB of R lower extremity with min cues. PT Short Term Goal 3 (Week 3): Patient will ambulate 10 ft with RW while maintaining R lower extremity NWB with mod A.  Skilled Therapeutic Interventions/Progress Updates:     Patient in bed aslepp upon PT arrival. Patient required several attempts of verbal stimulation to arouse and was lethargic with diaphoresis upon arousal. Vitals: BP 128/86, HR 91, SPO2 96% on RA, temp 86.4 deg F. RN made aware and reports patient has had a good morning. Patient became more aroused with mobility and diaphoresis resolved, however reported feeling drowsy throughout session. Patient reported 5/10 R lateral neck pain at end of session, RN made aware. PT provided repositioning, rest breaks, and distraction as pain interventions.   Therapeutic Activity: Bed Mobility: Patient performed supine to/from sit with min A, increased assist to come to sitting due to decreased arousal. Provided verbal cues for bringing his R knee across to roll to L side-lying to sit up and maintain NWB on R lower extremity. Patient sat EOB with supervision while donning pants and B socks with max A for time and energy conservation.  Transfers: Patient performed stand pivot from slightly elevated bed with min A with PT's foot under the patient's R foot to monitor for weight bearing. He performed sit to/from stand x2 in  the // bars with min A-CGA holding his R foot off the ground with these transfers. He performed a squat pivot back to bed due to increased fatigue with mod A and PT holding his R foot off the floor. Provided  verbal cues for forward weight shift, hand placement on AD, and R leg NWB with all transfers.  Gait Training:  Patient ambulated 15 feet with 2 180 degree turns in the // bars with min A for improved L foot clearance with lifting. Ambulated with hop-to gait pattern on L using B upper extremities on the bars with decreased L foot clearance and decreased elbow extension on R to lift his weight off his foot. Provided verbal cues for R upper extremity triceps and latissimus activation and increased L foot clearance. Required less cues for sequencing and R hand placement today and maintained his R foot off the ground without assist throughout.   Wheelchair Mobility:  Patient propelled wheelchair 180 feet with min A using L hemi-technique. Provided verbal cues for L foot placement to improved steering with manual facilitation initially and turning technique. Patient required assist due to veering R and decreased R attention. Provided cues for visual scanning to the R throughout to notice or avoid proximity to objects.  Therapeutic Exercise: Patient performed the following exercises with verbal and tactile cues for proper technique. -seated knee extension/flexion 2x10 with tactile cues to complete full range and 5 sec hold at end range for isometric hold and increased stretch -PROM knee hip flexion/extension in supine with 10 sec hold with over pressure at end range x5  Patient not agreeable to sitting OOB at end of session due to neck pain and fatigue, patient stated, "I need to sleep" then also stated "I am in this bed too much to get better" however, continued to insist on getting back to bed. Suggested patient sit OOB following next therapy session after getting rest, patient in agreement. Patient in the bed with breaks locked, bed alarm set, and all needs within reach.    Therapy Documentation Precautions:  Precautions Precautions: Fall Precaution Comments: R-sided hemi Restrictions Weight  Bearing Restrictions: No RLE Weight Bearing: Non weight bearing  Therapy/Group: Individual Therapy   L  PT, DPT  03/01/2020, 7:15 PM

## 2020-03-02 ENCOUNTER — Inpatient Hospital Stay (HOSPITAL_COMMUNITY): Payer: Self-pay

## 2020-03-02 ENCOUNTER — Inpatient Hospital Stay (HOSPITAL_COMMUNITY): Payer: Self-pay | Admitting: Speech Pathology

## 2020-03-02 MED ORDER — TOPIRAMATE 25 MG PO TABS
25.0000 mg | ORAL_TABLET | Freq: Every day | ORAL | Status: DC
  Administered 2020-03-02 – 2020-03-12 (×11): 25 mg via ORAL
  Filled 2020-03-02 (×11): qty 1

## 2020-03-02 NOTE — Progress Notes (Signed)
Speech Language Pathology Weekly Progress and Session Note  Patient Details  Name: Christopher Marks MRN: [id number] Date of Birth: 1990-07-13  Beginning of progress report period: February 24, 2020 End of progress report period: March 02, 2020  Today's Date: 03/02/2020 SLP Individual Time: 1110-1205 SLP Individual Time Calculation (min): 55 min  Short Term Goals: Week 2: SLP Short Term Goal 1 (Week 2): Patient will consume current diet with minimal overt s/s of aspiration and Min verbal cues for use of swallowing compensatory strategies. SLP Short Term Goal 1 - Progress (Week 2): Met SLP Short Term Goal 2 (Week 2): Patient will demonstrate sustained attention to functional tasks for ~10 minutes with Min verbal cues for redirection. SLP Short Term Goal 2 - Progress (Week 2): Met SLP Short Term Goal 3 (Week 2): Patient will recall new, daily information with Mod verbal and visual cues. SLP Short Term Goal 3 - Progress (Week 2): Met SLP Short Term Goal 4 (Week 2): Patient will demonstrate functional problem solving for basic and familiar tasks with Mod verbal cues. SLP Short Term Goal 4 - Progress (Week 2): Met SLP Short Term Goal 5 (Week 2): Patient will self-monitor and correct errors during functional tasks with Mod verbal cues. SLP Short Term Goal 5 - Progress (Week 2): Met    New Short Term Goals: Week 3: SLP Short Term Goal 1 (Week 3): Patient will consume current diet with minimal overt s/s of aspiration and supervision verbal cues for use of swallowing compensatory strategies. SLP Short Term Goal 2 (Week 3): Patient will consume trials of regular textures with efficient mastication and complete oral clearance without overt s/s of aspiration over 2 sessions prior to upgrade. SLP Short Term Goal 3 (Week 3): Patient will demonstrate sustained attention to functional tasks for ~30 minutes with Min verbal cues for redirection. SLP Short Term Goal 4 (Week 3): Patient will recall new,  daily information with Min verbal and visual cues. SLP Short Term Goal 5 (Week 3): Patient will demonstrate functional problem solving for mildly complex and familiar tasks with Mod verbal cues. SLP Short Term Goal 6 (Week 3): Patient will self-monitor and correct errors during functional tasks with Min verbal cues.  Weekly Progress Updates: Patient has made functional gains and has met 5 of 5 STGs this reporting period. Currently, patient is demonstrating behaviors consistent with a Rancho Level VI-emerging VII and requires overall Mod A to complete functional and familiar tasks safely in regards to functional problem solving, selective attention, recall of daily information and emergent awareness. Patient's overall cognitive functioning can be impacted by pain and fatigue at times. Patient is also consuming Dys. 3 textures with thin liquids with full supervision needed for use of small bites and a slow rate of self-feeding. Will administer trials of regular textures this reporting period. Patient and family education ongoing. Patient would benefit from continued skilled SLP intervention to maximize his cognitive and swallowing function and overall functional independence prior to discharge.      Intensity: Minumum of 1-2 x/day, 30 to 90 minutes Frequency: 3 to 5 out of 7 days Duration/Length of Stay: 03/15/20 Treatment/Interventions: Cognitive remediation/compensation;Dysphagia/aspiration precaution training;Cueing hierarchy;Internal/external aids;Speech/Language facilitation;Functional tasks;Patient/family education;Medication managment;Environmental controls;Therapeutic Activities   Daily Session  Skilled Therapeutic Interventions: Skilled treatment session focused on cognitive goals. Upon arrival, patient was awake in bed and agreeable to treatment session. SLP facilitated session by transferring him to the wheelchair to maximize arousal. Patient requested to play a previously learned card game  (2  days ago). SLP provided patient with Min verbal cues for recall of the procedures to the task and for complex problem solving. Patient also required supervision verbal cues for selective attention to task in a moderately distracting environment for ~40 minutes. Patient transferred back to bed at end of session. Patient left supine in bed with alarm on and all needs within reach. Continue with current plan of care.       Pain No/Denies Pain   Therapy/Group: Individual Therapy  Gayna Braddy 03/02/2020, 6:48 AM

## 2020-03-02 NOTE — Progress Notes (Signed)
Physical Therapy Session Note  Patient Details  Name: Christopher Marks MRN: [id number] Date of Birth: Jun 14, 1990  Today's Date: 03/02/2020 PT Individual Time: 4098-1191 PT Individual Time Calculation (min): 75 min   Short Term Goals: Week 1:  PT Short Term Goal 1 (Week 1): Patient will perform bed mobility with max A +1. PT Short Term Goal 1 - Progress (Week 1): Met PT Short Term Goal 2 (Week 1): Patient will perform basic transfers with mod A +2. PT Short Term Goal 2 - Progress (Week 1): Met PT Short Term Goal 3 (Week 1): Patient will initiate w/c mobility. PT Short Term Goal 3 - Progress (Week 1): Progressing toward goal PT Short Term Goal 4 (Week 1): Patient will achieve neutral cervical rotation with AROM. PT Short Term Goal 4 - Progress (Week 1): Met Week 2:  PT Short Term Goal 1 (Week 2): Patient will perform bed mobility with mod A. PT Short Term Goal 1 - Progress (Week 2): Met PT Short Term Goal 2 (Week 2): Patient will perform basic transfers with mod A of 1 person. PT Short Term Goal 2 - Progress (Week 2): Met PT Short Term Goal 3 (Week 2): Patient will perform sit to/from stand with mod A +2. PT Short Term Goal 3 - Progress (Week 2): Met PT Short Term Goal 4 (Week 2): Patient will initaite w/c mobility. PT Short Term Goal 4 - Progress (Week 2): Met Week 3:  PT Short Term Goal 1 (Week 3): Patient will perform bed mobility with supervision consistently. PT Short Term Goal 2 (Week 3): Patient will perform basic transfers with CGA maintaining NWB of R lower extremity with min cues. PT Short Term Goal 3 (Week 3): Patient will ambulate 10 ft with RW while maintaining R lower extremity NWB with mod A.  Skilled Therapeutic Interventions/Progress Updates:    PAIN pt c/o headache, nursing notified and provided w/meds during session.  Treatment to tolerance.  Pt initially sleeping but easy to arouse.  Pt states "I am not doing anything until I eat breakfast."  Easily redirected.    Dons pants w/mod assist, cues for initiating w/RLE, to avoid wbing in bridge position when raising pants.  Supine to sit w/suprevision, cues Sit to stand w/RW w/direct cues and therapist supporting RLE to adhere to wbing precautions, min assist. stand pivot transfer w/RW w/mod assist for sequencing/wbing precs/safety w/AD. Pt transported to hall for continued session.  Sit to stand repeated multiple trials throughout session, pt consistently attempts to wb fully thru RLE/max cues and assist to prevent/adhere to wbing.   Gait trials: 60f x 1 w/min assist, wc follow/pt attempts to sit impulsively at times,  cues to maintain safe distance within walker, maintains wbing during gait well. [id number] Manual stretching and soft tissue mob R upper traps/scalenes, very pt tender midrange of scalenes w/stretching  Bimanual task shooting ball into basketball hoop from sitting.  Seated soft lego puzzles w/guide placed to pt R to encourage attention/scanning to R, verbal cues to utilize R hand for assistance/placing of pieces, occasional pressure to secure L hand to facilitate use of R,  mod assist for accuracy following guide,   Pt left oob in wc w/alarm belt set and needs in reach.  Requesting to get into bed but no linens on bed, NT called and arrived to assist pt.  Handoff to NT, Zach.   Therapy Documentation Precautions:  Precautions Precautions: Fall Precaution Comments: R-sided hemi Restrictions Weight Bearing Restrictions:  No RLE Weight Bearing: Non weight bearing    Therapy/Group: Individual Therapy  Jerrilyn Cairo 03/02/2020, 1:02 PM

## 2020-03-02 NOTE — Progress Notes (Addendum)
Pt with explosive anger and cussing. Pt angry because he is not able to stand and walk to the bathroom with his walker. Pt instructed on safety and transfer plan. Pt states with foul words that he does not care about safety plan.Nurse tech able to diffuse situation. Pt given pain medication for complaints of neck pain.
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
## 2020-03-02 NOTE — Progress Notes (Signed)
Pt instructed on constipation and pt's need for a dulcolax suppository. Pt declined using expletives, but was in agreement to take miralax.

## 2020-03-02 NOTE — Progress Notes (Signed)
Occupational Therapy Weekly Progress Note  Patient Details  Name: Christopher Marks MRN: [id number] Date of Birth: September 22, 1990  Beginning of progress report period: February 24, 2020 End of progress report period: March 02, 2020  Today's Date: 03/02/2020 OT Individual Time: 1330-1430 OT Individual Time Calculation (min): 60 min    Patient has met 4 of 4 short term goals.  Pt has made steady progress this reporting period improving from SB transfers to toilet to squat pivot transfers with grab bar. Pt continues to still demo varying awareness and adherence to WB Precautions with OT intermittntly needing to facilitate NWB during mobility within ADLs. Pt can doff/don shirt with MOD A, pants with MAX A and bathe with MOD A overall. Pt has improved R attention significantly and incorporates RUE into tasks without cues,but requires facilitation to reach end ranges.  Patient continues to demonstrate the following deficits: muscle weakness, decreased cardiorespiratoy endurance, impaired timing and sequencing, abnormal tone, unbalanced muscle activation, motor apraxia and decreased coordination, decreased visual perceptual skills, decreased attention to right and decreased motor planning, decreased attention, decreased awareness, decreased problem solving, decreased safety awareness and decreased memory and decreased sitting balance, decreased standing balance, decreased postural control, hemiplegia, decreased balance strategies and difficulty maintaining precautions and therefore will continue to benefit from skilled OT intervention to enhance overall performance with BADL and iADL.  Patient progressing toward long term goals..  Continue plan of care.  OT Short Term Goals Week 2:  OT Short Term Goal 1 (Week 2): Pt will complete 2/4 steps of UB dressing OT Short Term Goal 1 - Progress (Week 2): Met OT Short Term Goal 2 (Week 2): Pt will complete UB bathing with no more than min assist and min  instructional cueing to sequence for two consecutive sessions. OT Short Term Goal 2 - Progress (Week 2): Met OT Short Term Goal 3 (Week 2): Pt will complete sliding board transfer to the drop arm commode with mod assist for two consecutive trials. OT Short Term Goal 3 - Progress (Week 2): Met OT Short Term Goal 4 (Week 2): Pt will maintain sustained attention to selfcare tasks for 15 mins with no more than min instructional cueing for re-direction. OT Short Term Goal 4 - Progress (Week 2): Met Week 3:  OT Short Term Goal 1 (Week 3): Pt will thread BLE into pants with no physicalA OT Short Term Goal 2 (Week 3): Pt will recall hemi dressing techniques wiht MIN quesiton cues OT Short Term Goal 3 (Week 3): Pt will complete toilet transfer with MIN A and good adherene to WB precautions OT Short Term Goal 4 (Week 3): Pt will manage pants past hips prior to toileting with MIN A only for balance  Skilled Therapeutic Interventions/Progress Updates:    1;1. Pt received in bed agreeable to OT after increased time to arouse and pt requesting to eat lunch. Pt supine>sitting with S using bed rails. Pt completes seated EOB eating with MOD VC for slowing rate of consumption. Pt states, "my whole family stuff their face. This is how we eat."  Ot uses conversation as distraction to slow consumtion. Pt intermittently mildly aggitated over DC day and continued eduation that pt will be discharged based on progress as well as potential change in WB status improving funciton. Pt easily redirected and after meal complete squat pivot transfer with MIN A and OT elevating RLE off floor to get out of and into bed. Pt completes chair tipping activity with mod-max resistance an scapular  faiitation to strengthen anterior deltoid in closed chain movement. Pt completes 2x30 seconds of neck stretching lateral flexion and rotation to L to improve head positioning. Exited session with pt seated in bed, exit alarm on and call light in  reach  Therapy Documentation Precautions:  Precautions Precautions: Fall Precaution Comments: R-sided hemi Restrictions Weight Bearing Restrictions: No RLE Weight Bearing: Non weight bearing General:   Vital Signs: Therapy Vitals Temp: 98.9 F (37.2 C) Temp Source: Oral Pulse Rate: 90 Resp: 20 BP: 116/83 Patient Position (if appropriate): Lying Oxygen Therapy SpO2: 100 % O2 Device: Room Air Pain: Pain Assessment Pain Score: Asleep ADL: ADL Eating: Maximal assistance Grooming: Maximal assistance Where Assessed-Grooming: Bed level (suction d/t arousal) Upper Body Bathing: Maximal assistance Where Assessed-Upper Body Bathing: Bed level Lower Body Bathing: Dependent Where Assessed-Lower Body Bathing: Bed level Upper Body Dressing: Maximal assistance Where Assessed-Upper Body Dressing: Bed level Lower Body Dressing: Dependent Where Assessed-Lower Body Dressing: Bed level Toileting: Unable to assess Toilet Transfer Method: Unable to assess Vision   Perception    Praxis   Exercises:   Other Treatments:     Therapy/Group: Individual Therapy  Tonny Branch 03/02/2020, 6:47 AM
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
## 2020-03-04 ENCOUNTER — Inpatient Hospital Stay (HOSPITAL_COMMUNITY): Payer: Self-pay | Admitting: Occupational Therapy

## 2020-03-04 ENCOUNTER — Inpatient Hospital Stay (HOSPITAL_COMMUNITY): Payer: Self-pay | Admitting: Speech Pathology

## 2020-03-04 ENCOUNTER — Inpatient Hospital Stay (HOSPITAL_COMMUNITY): Payer: Self-pay
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
## 2020-03-04 NOTE — Progress Notes (Signed)
Occupational Therapy Session Note  Patient Details  Name: Christopher Marks MRN: [id number] Date of Birth: 07-May-1990  Today's Date: 03/04/2020 OT Individual Time: 3779-3968 OT Individual Time Calculation (min): 57 min   Short Term Goals: Week 3:  OT Short Term Goal 1 (Week 3): Pt will thread BLE into pants with no physicalA OT Short Term Goal 2 (Week 3): Pt will recall hemi dressing techniques wiht MIN quesiton cues OT Short Term Goal 3 (Week 3): Pt will complete toilet transfer with MIN A and good adherene to WB precautions OT Short Term Goal 4 (Week 3): Pt will manage pants past hips prior to toileting with MIN A only for balance  Skilled Therapeutic Interventions/Progress Updates:    Pt greeted in bathroom handoff from nursing. Per nursing, pt "in a mood" this morning, cursing and agitated, however, pt pleasant and cooperative throughout OT session. Pt had urinated in the bed, per pt, the urinal over flowed and spilled in the bed. Pt requested to shower. OT covered IV site, then assisted with 5 ft ambulation with RW to the [facility] in shower. Pt needed verbal cues to maintain NWB R LE with sit<>stand, but was then able to "hop" with RW to the shower. Bathing completed with use of R UE for neuro re-ed. Pt able to use cut-out of BSC to reach under and wash buttocks and peri-area with verbal cues. Stand-pivot out of shower with use of grab bars and Min A to get to wc. Pt completed grooming tasks seated in wc at the sink with supervision. Min A for dressing tasks with verbal cues for hemi dressing techniques. Min A for balance when standing to pull up pants and verbal cues to maintain NWB R LE in standing. R UE NMR with joint input to bring pt through full shoulder, elbow, wrist, hand ROM. Scap mobility completed as well. Pt completed squat-pivot back to bed with CGA. Pt left semi-reclined in bed with needs met.   Therapy Documentation Precautions:  Precautions Precautions: Fall Precaution Comments:  R-sided hemi Restrictions Weight Bearing Restrictions: Yes RLE Weight Bearing: Non weight bearing Pain:   denies pain   Therapy/Group: Individual Therapy  Valma Cava 03/04/2020, 8:35 AM

## 2020-03-04 NOTE — Progress Notes (Signed)
Speech Language Pathology Daily Session Note  Patient Details  Name: Christopher Marks MRN: [id number] Date of Birth: 04-Jul-1990  Today's Date: 03/04/2020 SLP Individual Time: 1030-1055 SLP Individual Time Calculation (min): 25 min  Short Term Goals: Week 3: SLP Short Term Goal 1 (Week 3): Patient will consume current diet with minimal overt s/s of aspiration and supervision verbal cues for use of swallowing compensatory strategies. SLP Short Term Goal 2 (Week 3): Patient will consume trials of regular textures with efficient mastication and complete oral clearance without overt s/s of aspiration over 2 sessions prior to upgrade. SLP Short Term Goal 3 (Week 3): Patient will demonstrate sustained attention to functional tasks for ~30 minutes with Min verbal cues for redirection. SLP Short Term Goal 4 (Week 3): Patient will recall new, daily information with Min verbal and visual cues. SLP Short Term Goal 5 (Week 3): Patient will demonstrate functional problem solving for mildly complex and familiar tasks with Mod verbal cues. SLP Short Term Goal 6 (Week 3): Patient will self-monitor and correct errors during functional tasks with Min verbal cues.  Skilled Therapeutic Interventions: Pt was seen for skilled ST targeting cognitive goals. Upon arrival pt was under his covers but agreeable to participate in ST activities if he could remain in bed. He acknowledged feeling "angry and upset" earlier this AM, however he was cooperative and very calm throughout ST session. Pt politely declined opportunity to try regular textured chips with therapist to work toward diet advancement. In functional conversation he recalled 3 activities targeted in ST related to cognitive interventions with Supervision A prompting. Overall Min A verbal cues provided for problem solving how to compensate for feeling cold in his room. Fatigue increased throughout session, and pt exhibited 1 instance of language of confusion (asked  "is that Casimiro Needle Jordan's son in here?"), but easily redirected. Pt eventually unable to maintain appropriate level of arousal to participate further, therefore session ended 20 minutes early. Pt was left laying in bed with alarm set and needs within reach. Continue per current plan of care.       Pain Pain Assessment Pain Scale: 0-10 Pain Score: 0-No pain  Therapy/Group: Individual Therapy  Little Ishikawa 03/04/2020, 12:00 PM

## 2020-03-04 NOTE — Progress Notes (Signed)
Physical Therapy Session Note  Patient Details  Name: Christopher Marks MRN: [id number] Date of Birth: Mar 19, 1991  Today's Date: 03/04/2020 PT Individual Time: 1305-1420 PT Individual Time Calculation (min): 75 min   Short Term Goals: Week 3:  PT Short Term Goal 1 (Week 3): Patient will perform bed mobility with supervision consistently. PT Short Term Goal 2 (Week 3): Patient will perform basic transfers with CGA maintaining NWB of R lower extremity with min cues. PT Short Term Goal 3 (Week 3): Patient will ambulate 10 ft with RW while maintaining R lower extremity NWB with mod A.  Skilled Therapeutic Interventions/Progress Updates:     Session 1: Patient in bed with RN providing morning meds upon PT arrival. Patient alert and agreeable to PT session. Patient denied pain during session.  Patient asking if he could leave before thanksgiving, planned d/c date is 03/15/20. He is also stating that he wants to go to a weekend cookout 2 hours away where he will stay multiple days with friends and family. Educated patient on therapy goals, current level of assist required for mobility, need for family education prior to d/c, and safety concerns with NWB status and deficits with motor planning and recall.  Also, educated patient on TBI symptoms and tolerance for over stimulating environments, safety concerns with a long car ride and different home accessibility.   Patient asking if he could go home if his grandmother will take him. Called his grandmother during session. She has concerns about d/c and states that she has been in agreement with the plan for d/c on 12/2 and would like him to recover as much as possible prior to d/c. Discussed this with the patient. He became verbally agitated and yelled about needing to go home and threatened to not participate in therapies until he was d/c'd. Patient unable to be redirected at this time. Informed patient that this PT would be seeing him later today and at  that time he would have an opportunity to demonstrate his current level of function and we could continue this discussion. Patient in agreement, but still visibly upset.    Patient in the bed at end of session with breaks locked, bed alarm set, and all needs within reach.   Session 2: Patient in bed upon PT arrival. Patient alert and agreeable to PT session. Patient denied pain during session, reported he was pre-medicated and felt "high" asking that his dose not be as strong, RN made aware.   Patient recalled earlier discussion and stated he wanted to do as much as he could this afternoon.   Therapeutic Activity: Bed Mobility: Patient performed supine to/from sit with supervision on a flat bed and mat table during session. Provided verbal cues for NWB on R leg x1. Transfers: Patient performed stand pivot transfers with min A bed>w/c and w/c>TIS w/c using RW. He performed sit to/from stand x2 with min A-CGA using RW, and squat pivot w/c<>mat table with CGA-close supervision. On initial transfer, attempted teach-back method for performing sit to stand, patient initiated stand with weight bearing through B lower extremities. For all subsequent transfers, provided verbal cues and demonstration for technique prior to each initial type of transfer and had patient perform teach back with second trial of each transfer. Continued to require mod cues for NWB and max cues for hand placement during transfers with RW for safety. Patient able to hold his foot up during transfers and compliant following cues.  Gait Training:  Patient ambulated 40 feet with 2  180 degree truns using RW with min A. Ambulated with hop-to gait pattern on L, maintained NWB on R throughout after first cue. Provided verbal cues for increased use of UEs to lift his foot off the floor rather than hop for decreased impact at initial contact, and to reduce proximity to RW for safety.  Wheelchair Mobility:  Patient propelled wheelchair 71  feet with supervision using B upper extremities and intermittent use of L lower extremity. Provided max verbal cues for increased stroke length on R, use of L foot to assist with steering due to R arm weakness, attending to his R to avoid objects, and turning technique (required hand over hand assist for tighter turns). Reviewed w/c set-up for squat pivot transfers with use of breaks, leg rests, and arm rests during session. Will need reinforcement.  Therapeutic Exercise: Patient performed the following exercises with verbal and tactile cues for proper technique. -R quad sets with towel roll under ankle x10 with 5 sec hold -R heel slides through full ROM x10, knee flexion >100 deg each trial -R SLR x10, initally with >10 deg extensor lag, provided min A to maintain full extension through range -R ankle DF with gentle OP 2x30 sec to neutral  During exercises patient apologized for his behavior during our earlier session. Reported that he had thought about what we had discussed and realized it was not a good time to go to a big party during the holiday. He also stated "I need to put my health first," and preceded to teach back education provided from this morning without prompting. Provided positive reinforcement of patients recognition and self reflection. Placed quote about putting health first on patient's white board as a reminder. Patient stated "I will read it every morning when I get up."  Patient with appropriate questions about when weight bearing status would be changed and use of PRAFO in the room. Provided education about wearing the PRAFO at night to reduce risk of PF contracture and will follow up with medical team about weight bearing. Patient receptive to therapist's answers.  Patient in TIS w/c at end of session with breaks locked, seat belt alarm set, and all needs within reach.    Therapy Documentation Precautions:  Precautions Precautions: Fall Precaution Comments: R-sided  hemi Restrictions Weight Bearing Restrictions: Yes RLE Weight Bearing: Non weight bearing   Therapy/Group: Individual Therapy  Saahas Hidrogo L Nocole Zammit PT, DPT  03/04/2020, 3:50 PM

## 2020-03-05 ENCOUNTER — Inpatient Hospital Stay (HOSPITAL_COMMUNITY): Payer: Self-pay | Admitting: Speech Pathology

## 2020-03-05 ENCOUNTER — Inpatient Hospital Stay (HOSPITAL_COMMUNITY): Payer: Self-pay

## 2020-03-05 ENCOUNTER — Encounter (HOSPITAL_COMMUNITY): Payer: Self-pay | Admitting: Psychology

## 2020-03-05 DIAGNOSIS — I10 Essential (primary) hypertension: Secondary | ICD-10-CM

## 2020-03-05 DIAGNOSIS — T148XXA Other injury of unspecified body region, initial encounter: Secondary | ICD-10-CM

## 2020-03-05 LAB — CBC
HCT: 31.3 % — ABNORMAL LOW (ref 39.0–52.0)
Hemoglobin: 9.6 g/dL — ABNORMAL LOW (ref 13.0–17.0)
MCH: 25.1 pg — ABNORMAL LOW (ref 26.0–34.0)
MCHC: 30.7 g/dL (ref 30.0–36.0)
MCV: 81.7 fL (ref 80.0–100.0)
Platelets: 356 10*3/uL (ref 150–400)
RBC: 3.83 MIL/uL — ABNORMAL LOW (ref 4.22–5.81)
RDW: 16.5 % — ABNORMAL HIGH (ref 11.5–15.5)
WBC: 9 10*3/uL (ref 4.0–10.5)
nRBC: 0 % (ref 0.0–0.2)

## 2020-03-05 LAB — BASIC METABOLIC PANEL
Anion gap: 12 (ref 5–15)
BUN: 9 mg/dL (ref 6–20)
CO2: 21 mmol/L — ABNORMAL LOW (ref 22–32)
Calcium: 9.3 mg/dL (ref 8.9–10.3)
Chloride: 105 mmol/L (ref 98–111)
Creatinine, Ser: 0.86 mg/dL (ref 0.61–1.24)
GFR, Estimated: >60 mL/min (ref >60)
Glucose, Bld: 121 mg/dL — ABNORMAL HIGH (ref 70–99)
Potassium: 3.6 mmol/L (ref 3.5–5.1)
Sodium: 138 mmol/L (ref 135–145)

## 2020-03-05 IMAGING — DX DG TIBIA/FIBULA 2V*R*
4 series · 4 of 4 positions shown · non-contrast
Comparison: [DATE].

CLINICAL DATA: Status post intramedullary rod fixation of right
tibial fracture.

EXAM:
RIGHT TIBIA AND FIBULA - 2 VIEW

[tibia ap (1 of 2)]
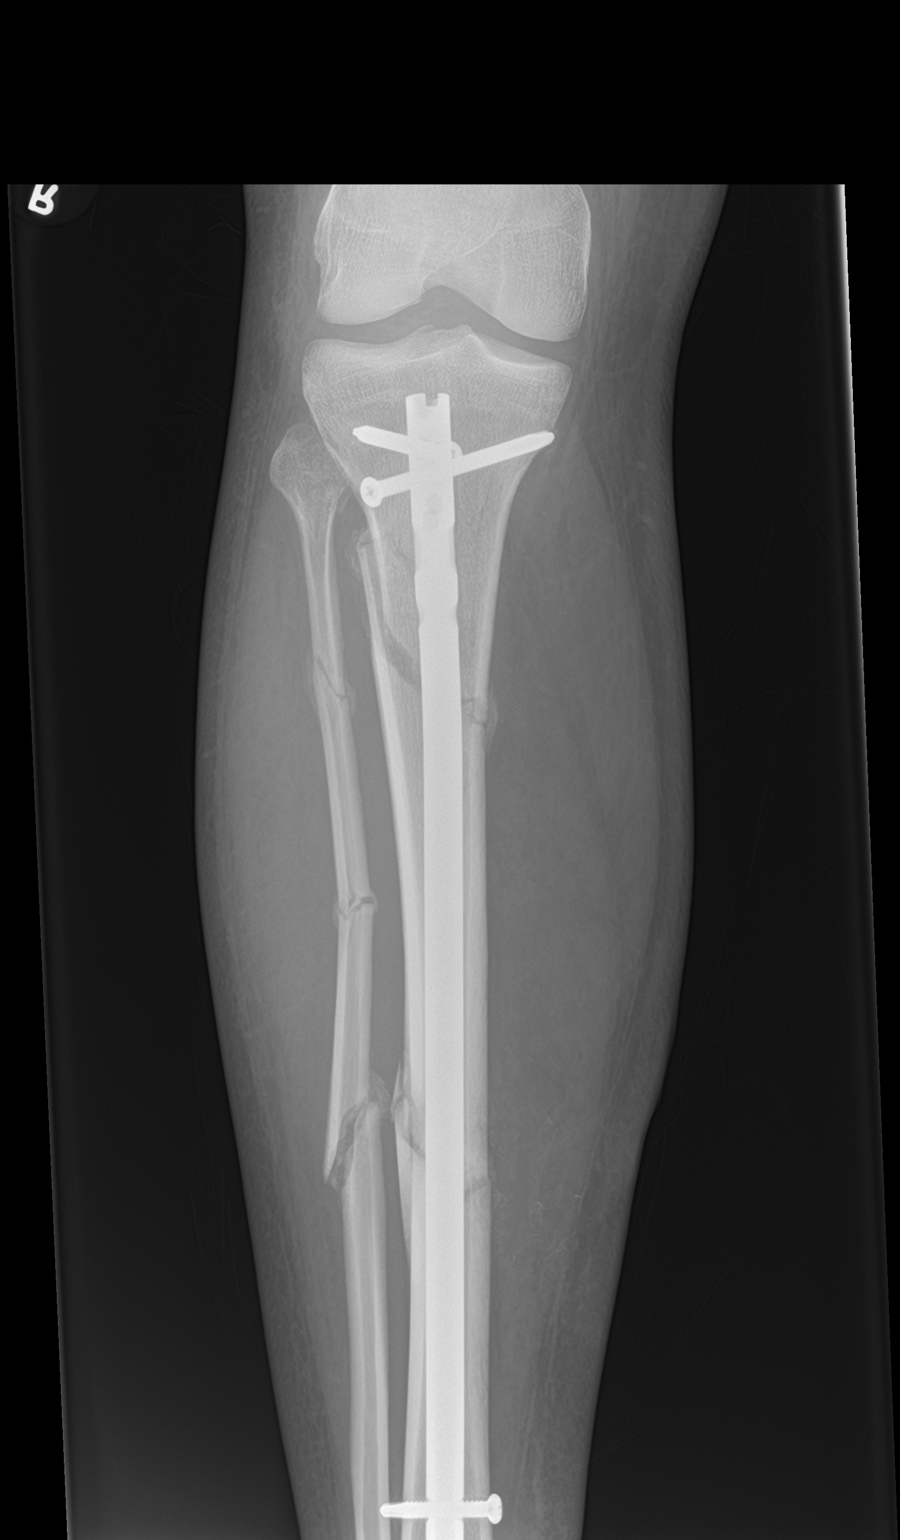

[tibia ap (2 of 2)]
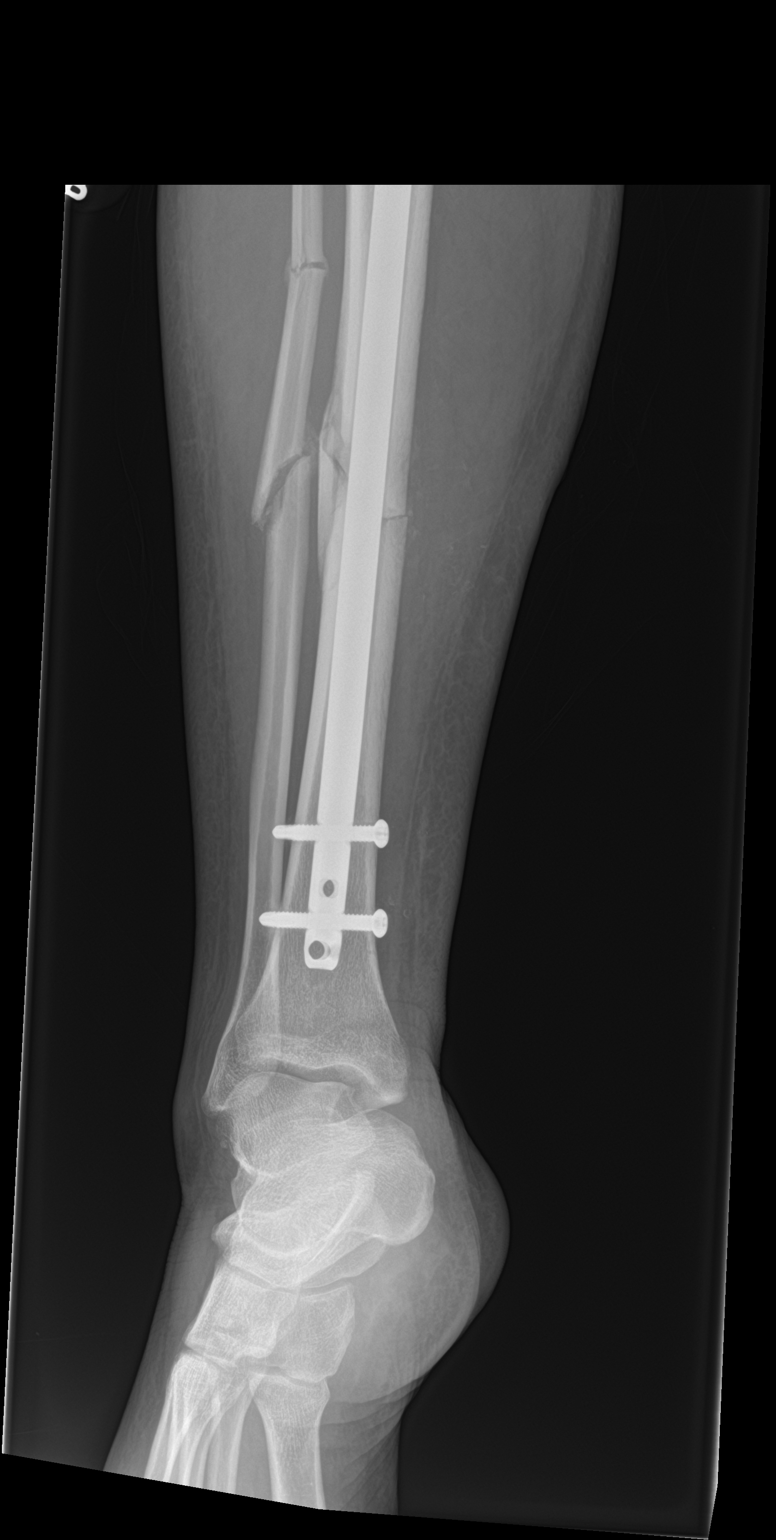

[tibia lat (1 of 2)]
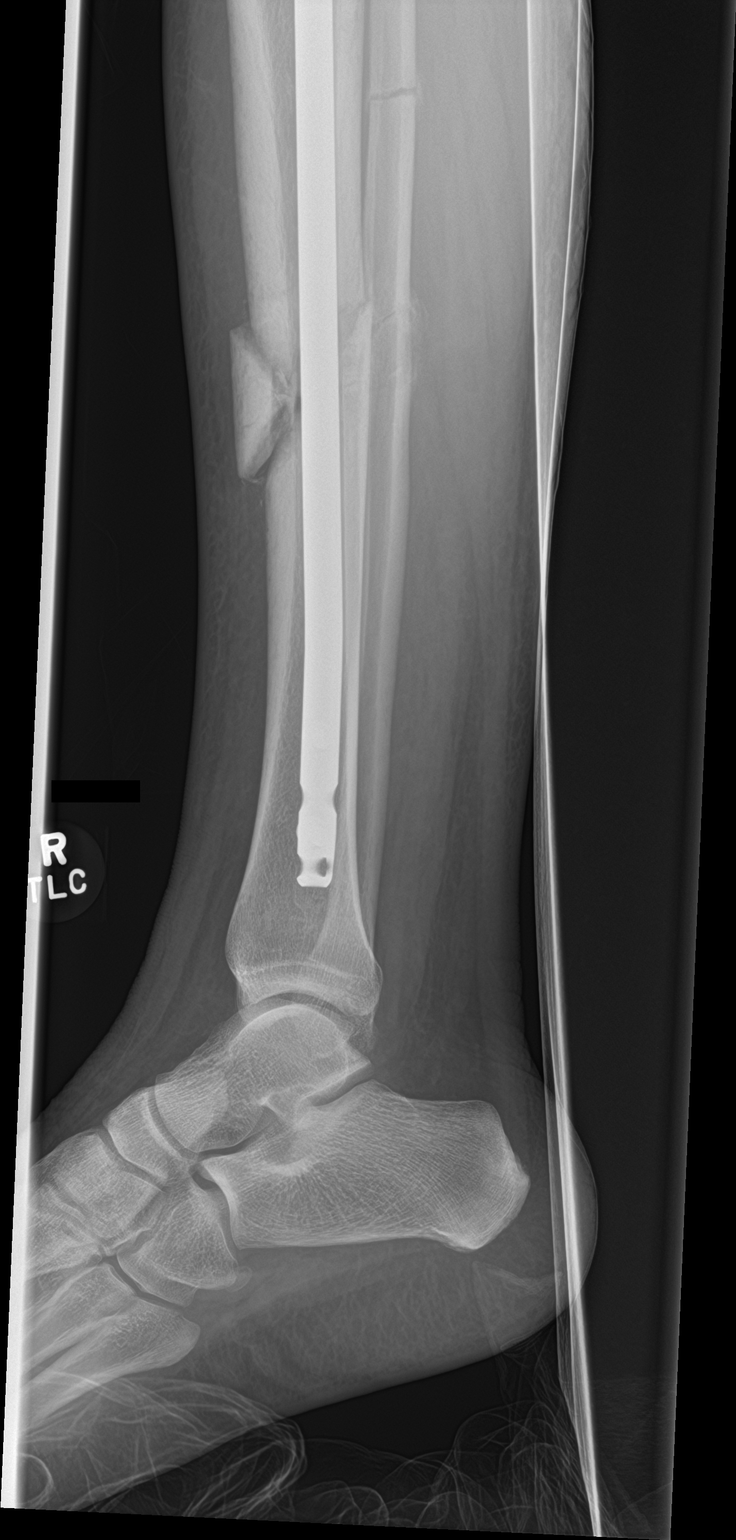

[tibia lat (2 of 2)]
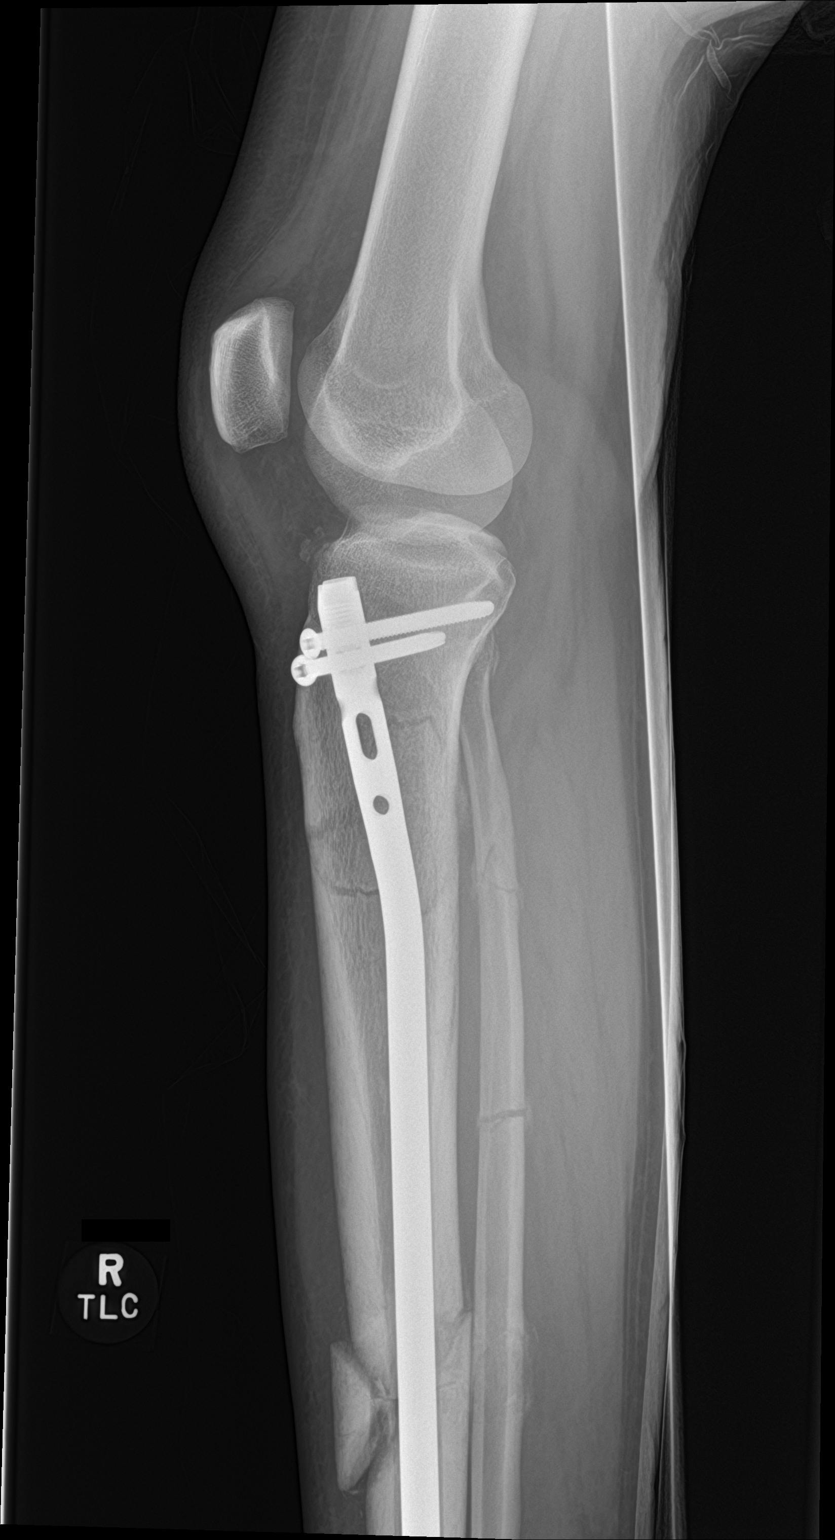

[4 of 4 positions shown; findings below may reference images not displayed]

FINDINGS: Status post intramedullary rod fixation of fractures involving the
proximal and distal portions of the right tibia. Good alignment of
fracture fragments is noted. Continued presence of 3 fractures
involving the proximal and middle portions of the right fibula. Some
callus formation is noted suggesting healing. No new fracture or
dislocation is noted.
IMPRESSION: Status post intramedullary rod fixation of proximal and distal right
tibial fractures. Continued presence of 3 fractures involving the
proximal and middle portions of the right fibula.

## 2020-03-05 MED ORDER — SENNOSIDES-DOCUSATE SODIUM 8.6-50 MG PO TABS
2.0000 | ORAL_TABLET | Freq: Every day | ORAL | Status: DC
  Administered 2020-03-07 – 2020-03-11 (×5): 2 via ORAL
  Filled 2020-03-05 (×7): qty 2

## 2020-03-05 MED ORDER — ALUM & MAG HYDROXIDE-SIMETH 200-200-20 MG/5ML PO SUSP
30.0000 mL | Freq: Four times a day (QID) | ORAL | Status: DC | PRN
  Administered 2020-03-05: 30 mL via ORAL
  Filled 2020-03-05: qty 30

## 2020-03-05 MED ORDER — CHLORDIAZEPOXIDE HCL 5 MG PO CAPS
15.0000 mg | ORAL_CAPSULE | Freq: Every day | ORAL | Status: DC
  Filled 2020-03-05: qty 1
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
## 2020-03-05 NOTE — Progress Notes (Signed)
Patient ID: CAELIN RAYL, male   DOB: October 01, 1990, 29 y.o.   MRN: [id number]   03/01/2020- SW spoke with Judeth Cornfield to provide updates from team conference, however, she was at a conference and not available. SW informed will provide updates next week after team conference.   03/05/2020- Per medical team, potential earlier discharge.  SW left message for Delray Alt of RN [phone]) to provide updates on pt condition, and discuss discharge. SW waiting on follow-up.  [name], MSW, LCSWA Office: [phone] Cell: [phone] Fax: [phone]

## 2020-03-05 NOTE — Progress Notes (Addendum)
Patient ID: Christopher Marks, male   DOB: 24-Sep-1990, 29 y.o.   MRN: [id number]  SW called pt grandmother [name] [phone]) who confirms that she will have transportation to fam edu tomorrow 1pm-3pm. States she will need transportation to home. SW informed will follow-up once more information. *SW called pt grandmother to inform that we will use cab voucher to get her home.   03/05/2020- Per medical team, potential earlier discharge. SW sent charity  HHPT/OT/SLP referral to [facility]. SW waiting on follow-up.   [name], MSW, LCSWA Office: [phone] Cell: [phone] Fax: [phone]

## 2020-03-05 NOTE — Progress Notes (Signed)
Pt slept well throughout the night. No behaviors noted. Pt noted to be impulsive this morning, pt out of bed unassisted, standing voiding in urinal. Responding well to redirection of staff.

## 2020-03-05 NOTE — Progress Notes (Signed)
Physical Therapy Session Note  Patient Details  Name: Christopher Marks MRN: [id number] Date of Birth: December 25, 1990  Today's Date: 03/05/2020 PT Individual Time: 0800-0900 PT Individual Time Calculation (min): 60 min   Short Term Goals: Week 3:  PT Short Term Goal 1 (Week 3): Patient will perform bed mobility with supervision consistently. PT Short Term Goal 2 (Week 3): Patient will perform basic transfers with CGA maintaining NWB of R lower extremity with min cues. PT Short Term Goal 3 (Week 3): Patient will ambulate 10 ft with RW while maintaining R lower extremity NWB with mod A.  Skilled Therapeutic Interventions/Progress Updates:     Patient in bed upon PT arrival. Patient alert and agreeable to PT session. Patient reported 5/10 abdominal pain, that improved to a 1/10 after BM during session, RN and MD made aware. PT provided repositioning, rest breaks, and distraction as pain interventions throughout session. Patient reports the pain kept him awake at night and that he slept poorly.  Therapeutic Activity: Bed Mobility: Patient performed supine to/from sit with supervision in a flat bed without use of bed rail. Provided verbal cues for not pushing through his R foot to roll to maintain NWB. Transfers: Patient performed sit to/from stand x1 and a toilet transfer x1 with min A, except mod A to come to standing from low toilet, using the RW and a grab bar during toileting. Provided verbal cues for maintaining NWB with R foot throughout, hand placement, and sequencing. Patient placed his foot on therapist's for transfers with minimal weight bearing with heavy cues. Patient was continent of bowl during toileting, required mod A for peri-care for thoroughness and max A for lower body clothing management. Patient initiated assist with pulling up his pants in standing, but unable to maintain balance and weight bearing precautions. Cued patient to use B upper extremity support and allow PT to perform  clothing management for safety.   Gait Training:  Patient ambulated ~15 feet to the bathroom using RW with min A. Ambulated with hop-to gait pattern on L with very short step length, decreased L foot clearance, and increased effort with mobility. Maintained NWB throughout gait. Provided verbal cues for increased foot clearance with use of upper extremities to lift his foot, proximity to RW, sequencing over incline into the bathroom, and facilitation for stabilization and forward progression of RW.  Wheelchair Mobility:  Patient propelled wheelchair grossly 85 feet x2 with supervision using B upper extremities and his L foot for propulsion with 2 pillows placed behind him for improved leverage using his hamstrings to steer with his L leg. Provided verbal cues for propulsion technique, using of his R arm for improved awareness and motor control, turning technique with intermittent hand over hand assist, and a visual target in front and to the L of the patient to encourage steering left due to tendency to veer R.   MD rounded at end of session, discussed abdominal pain, d/c plans, and weight bearing status with MD during assessment.  Patient in bed due to fatigue at end of session with breaks locked, bed alarm set, and all needs within reach.   Therapy Documentation Precautions:  Precautions Precautions: Fall Precaution Comments: R-sided hemi Restrictions Weight Bearing Restrictions: Yes RLE Weight Bearing: Weight bearing as tolerated General:   Vital Signs: Therapy Vitals Temp: 98 F (36.7 C) Pulse Rate: 96 Resp: 18 BP: 131/84 Patient Position (if appropriate): Sitting Oxygen Therapy SpO2: 100 % O2 Device: Room Air   Therapy/Group: Individual Therapy  Miya Luviano  L Terresa Marlett PT, DPT  03/05/2020, 5:06 PM

## 2020-03-05 NOTE — Progress Notes (Signed)
Speech Language Pathology Daily Session Note  Patient Details  Name: Christopher Marks MRN: [id number] Date of Birth: 1991/02/28  Today's Date: 03/05/2020 SLP Individual Time: 1100-1155 SLP Individual Time Calculation (min): 55 min  Short Term Goals: Week 3: SLP Short Term Goal 1 (Week 3): Patient will consume current diet with minimal overt s/s of aspiration and supervision verbal cues for use of swallowing compensatory strategies. SLP Short Term Goal 2 (Week 3): Patient will consume trials of regular textures with efficient mastication and complete oral clearance without overt s/s of aspiration over 2 sessions prior to upgrade. SLP Short Term Goal 3 (Week 3): Patient will demonstrate sustained attention to functional tasks for ~30 minutes with Min verbal cues for redirection. SLP Short Term Goal 4 (Week 3): Patient will recall new, daily information with Min verbal and visual cues. SLP Short Term Goal 5 (Week 3): Patient will demonstrate functional problem solving for mildly complex and familiar tasks with Mod verbal cues. SLP Short Term Goal 6 (Week 3): Patient will self-monitor and correct errors during functional tasks with Min verbal cues.  Skilled Therapeutic Interventions: Skilled treatment session focused on cognitive and dysphagia goals. Upon arrival, patient was sitting EOB and reported a stomach ache. RN made aware and administered medications. Patient requested to use the bathroom and required Mod verbal cues for safety and problem solving with task. Patient appeared weaker today requiring overall Min-Mod A for standing. Min verbal cues were also needed for problem solving with self-care tasks. SLP requested an early tray to continue to work towards diet advancement, however, it did come. Therefore, SLP provided a snack of regular textures (bag of chips) which patient ate siting EOB. Patient consumed trials without overt s/s of aspiration and demonstrated efficient mastication with  complete oral clearance. Recommend patient upgrade to regular textures. Patient left supine in bed with alarm on and all needs within reach. Continue with current plan of care.      Pain Stomach ache RN aware and administered medications  Therapy/Group: Individual Therapy  Hemi Chacko 03/05/2020, 12:47 PM
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
## 2020-03-05 NOTE — Progress Notes (Signed)
Physical Therapy Session Note  Patient Details  Name: Christopher Marks MRN: [id number] Date of Birth: 1990/07/14  Today's Date: 03/05/2020 PT Individual Time: 1400-1415 PT Individual Time Calculation (min): 15 min   Short Term Goals: Week 3:  PT Short Term Goal 1 (Week 3): Patient will perform bed mobility with supervision consistently. PT Short Term Goal 2 (Week 3): Patient will perform basic transfers with CGA maintaining NWB of R lower extremity with min cues. PT Short Term Goal 3 (Week 3): Patient will ambulate 10 ft with RW while maintaining R lower extremity NWB with mod A.  Skilled Therapeutic Interventions/Progress Updates:     Patient in room arguing with nursing, as he had slid far forward in his chair at risk for sliding onto the floor as PT passing the room. Patient clearly agitated and uncooperative. Initiate afternoon PT session early due to safety concerns and agitation with nursing staff. Patient greeted therapist appropriately, then continued to yell at the nursing staff in the room. Asked nursing staff to leave at this time, and they did. Patient continued to yell, telling therapist what was going on. Able to redirect patient by offering to return him to the bed. Patient responded to cues to scoot back into the chair with min A, without compliance to weight bearing due to unsafe positioning. Patient then performed a squat pivot transfer to the bed with min A and cues for maintaining NWB, patient followed cues. He then performed sit to supine with supervision. Patient continued to perseverate on the incident, but calming as time went on. Spoke to patient in a low, calm voice throughout. Patient continued to report increased abdominal pain from this morning, stating, "I need to pass gass," RN made aware. Transport team arrived to take the patient to x-ray. Patient initially agitated by them coming at this time. Able to redirect and encouraged the patient to remain calm during the  procedure, after educating him on what was being done and what it was for. Patient calm and in the bed, handed off to transport team at end of session. Patient missed 15 min of skilled PT due to being off the unit for x-ray, RN made aware.   Returned at [facility] to make-up missed time. Patient sleeping calmly in the bed at this time. Will attempt to make-up missed time as able.    Therapy Documentation Precautions:  Precautions Precautions: Fall Precaution Comments: R-sided hemi Restrictions Weight Bearing Restrictions: Yes RLE Weight Bearing: Weight bearing as tolerated General: PT Amount of Missed Time (min): 15 Minutes PT Missed Treatment Reason: Xray  Therapy/Group: Individual Therapy  Samina Weekes L Delmo Matty PT, DPT  03/05/2020, 4:13 PM

## 2020-03-05 NOTE — Progress Notes (Addendum)
Repeat imaging of right tibia has been reviewed. Fracture remains in appropriate alignment. Some interval callus noted around both tibia and fibula fractures, No signs of hardware failure or loosening.   PLAN: - Patient okay to advance to WBAT RLE. Let pain be the guide when ambulating.  - Plan for follow-up with OTS 3-4 weeks after discharge    Jeany Seville A. Ladonna Snide [facility] [phone] (office) [email or web address]

## 2020-03-05 NOTE — Progress Notes (Signed)
Occupational Therapy Session Note  Patient Details  Name: Christopher Marks MRN: [id number] Date of Birth: 04-Aug-1990  Today's Date: 03/05/2020 OT Individual Time: 9758-8325 OT Individual Time Calculation (min): 43 min    Short Term Goals: Week 3:  OT Short Term Goal 1 (Week 3): Pt will thread BLE into pants with no physicalA OT Short Term Goal 2 (Week 3): Pt will recall hemi dressing techniques wiht MIN quesiton cues OT Short Term Goal 3 (Week 3): Pt will complete toilet transfer with MIN A and good adherene to WB precautions OT Short Term Goal 4 (Week 3): Pt will manage pants past hips prior to toileting with MIN A only for balance  Skilled Therapeutic Interventions/Progress Updates:    Pt received sitting EOB with NT present. Pt completed sit > stand from EOB with RW with min cueing for R NWB precautions. Pt was able to maintain precautions during several hops to w/c. Min cueing for hand placement during stand> sit. Pt sat in the w/c at the sink and completed oral care with set up assist. Pt declined ADLs stating he had completed them earlier (highly doubt this). Pt was taken via w/c to the tub room where he was given demo on use of TTB. He returned demonstration with CGA. Min cueing for RLE NWB. Pt returned to the w/c where he was then transported to the dayroom. Pt participated in Wii bowling game in order to address R shoulder flexion/extension. Min facilitation provided at the elbow and shoulder to promote more extended elbow with R shoulder swing. Pt with trap compensation, cued to reduce. Pt returned to his room and was left sitting up with all needs met, chair alarm belt fastened.   Therapy Documentation Precautions:  Precautions Precautions: Fall Precaution Comments: R-sided hemi Restrictions Weight Bearing Restrictions: Yes RLE Weight Bearing: Non weight bearing   Therapy/Group: Individual Therapy  Curtis Sites 03/05/2020, 6:50 AM

## 2020-03-06 ENCOUNTER — Encounter (HOSPITAL_COMMUNITY): Payer: Self-pay | Admitting: Speech Pathology

## 2020-03-06 ENCOUNTER — Ambulatory Visit (HOSPITAL_COMMUNITY): Payer: Self-pay

## 2020-03-06 ENCOUNTER — Encounter (HOSPITAL_COMMUNITY): Payer: Self-pay | Admitting: Occupational Therapy

## 2020-03-06 ENCOUNTER — Inpatient Hospital Stay (HOSPITAL_COMMUNITY): Payer: Self-pay | Admitting: Physical Therapy

## 2020-03-06 MED ORDER — CHLORDIAZEPOXIDE HCL 5 MG PO CAPS
15.0000 mg | ORAL_CAPSULE | Freq: Every day | ORAL | Status: DC
  Administered 2020-03-06: 15 mg via ORAL
  Filled 2020-03-06: qty 3

## 2020-03-06 MED ORDER — METHOCARBAMOL 500 MG PO TABS
500.0000 mg | ORAL_TABLET | Freq: Four times a day (QID) | ORAL | Status: DC
  Administered 2020-03-06 – 2020-03-12 (×18): 500 mg via ORAL
  Filled 2020-03-06 (×22): qty 1

## 2020-03-06 MED ORDER — BETHANECHOL CHLORIDE 25 MG PO TABS
25.0000 mg | ORAL_TABLET | Freq: Three times a day (TID) | ORAL | Status: DC
  Administered 2020-03-06 – 2020-03-07 (×2): 25 mg via ORAL
  Filled 2020-03-06 (×2): qty 1

## 2020-03-06 MED ORDER — CHLORDIAZEPOXIDE HCL 5 MG PO CAPS
10.0000 mg | ORAL_CAPSULE | Freq: Every day | ORAL | Status: DC
  Administered 2020-03-07 – 2020-03-10 (×4): 10 mg via ORAL
  Filled 2020-03-06 (×4): qty 2

## 2020-03-06 NOTE — Progress Notes (Signed)
Speech Language Pathology Daily Session Note  Patient Details  Name: Christopher Marks MRN: [id number] Date of Birth: September 24, 1990  Today's Date: 03/06/2020 SLP Individual Time: 1415-1445 SLP Individual Time Calculation (min): 30 min  Short Term Goals: Week 3: SLP Short Term Goal 1 (Week 3): Patient will consume current diet with minimal overt s/s of aspiration and supervision verbal cues for use of swallowing compensatory strategies. SLP Short Term Goal 2 (Week 3): Patient will consume trials of regular textures with efficient mastication and complete oral clearance without overt s/s of aspiration over 2 sessions prior to upgrade. SLP Short Term Goal 3 (Week 3): Patient will demonstrate sustained attention to functional tasks for ~30 minutes with Min verbal cues for redirection. SLP Short Term Goal 4 (Week 3): Patient will recall new, daily information with Min verbal and visual cues. SLP Short Term Goal 5 (Week 3): Patient will demonstrate functional problem solving for mildly complex and familiar tasks with Mod verbal cues. SLP Short Term Goal 6 (Week 3): Patient will self-monitor and correct errors during functional tasks with Min verbal cues.  Skilled Therapeutic Interventions: Skilled treatment session focused on family education with the patient's girlfriend and grandma. Both were educated regarding patient's current swallowing function, diet recommendations, appropriate textures, swallowing compensatory strategies and medication administration. Patient and family were also educated regarding patient's current cognitive functioning and strategies to utilize at home to maximize attention, recall and overall safety and independence with functional tasks, especially the importance of 24 hour supervision. Both verbalized understanding and handouts were given to reinforce information. Patient handed off to PT. Continue with current plan of care.      Pain No/Denies Pain   Therapy/Group:  Individual Therapy  Thadd Apuzzo 03/06/2020, 2:57 PM
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
## 2020-03-06 NOTE — Plan of Care (Signed)
  Problem: Consults Goal: RH BRAIN INJURY PATIENT EDUCATION Description: Description: See Patient Education module for eduction specifics Outcome: Progressing Goal: Skin Care Protocol Initiated - if Braden Score 18 or less Description: If consults are not indicated, leave blank or document N/A Outcome: Progressing   Problem: RH BLADDER ELIMINATION Goal: RH STG MANAGE BLADDER WITH ASSISTANCE Description: STG Manage Bladder With mod Assistance Outcome: Progressing Goal: RH STG MANAGE BLADDER WITH MEDICATION WITH ASSISTANCE Description: STG Manage Bladder With Medication With mod Assistance. Outcome: Progressing   Problem: RH SKIN INTEGRITY Goal: RH STG SKIN FREE OF INFECTION/BREAKDOWN Description: Skin to remain free from additional breakdown while on rehab with mod assistance. Outcome: Progressing Goal: RH STG MAINTAIN SKIN INTEGRITY WITH ASSISTANCE Description: STG Maintain Skin Integrity With mod Assistance. Outcome: Progressing Goal: RH STG ABLE TO PERFORM INCISION/WOUND CARE W/ASSISTANCE Description: STG Able To Perform Incision/Wound Care With mod Assistance. Outcome: Progressing   Problem: RH SKIN INTEGRITY Goal: RH STG SKIN FREE OF INFECTION/BREAKDOWN Description: Skin to remain free from additional breakdown while on rehab with mod assistance. Outcome: Progressing Goal: RH STG MAINTAIN SKIN INTEGRITY WITH ASSISTANCE Description: STG Maintain Skin Integrity With mod Assistance. Outcome: Progressing Goal: RH STG ABLE TO PERFORM INCISION/WOUND CARE W/ASSISTANCE Description: STG Able To Perform Incision/Wound Care With mod Assistance. Outcome: Progressing   Problem: RH SAFETY Goal: RH STG ADHERE TO SAFETY PRECAUTIONS W/ASSISTANCE/DEVICE Description: STG Adhere to Safety Precautions With mod Assistance/Device. Outcome: Progressing Goal: RH STG DECREASED RISK OF FALL WITH ASSISTANCE Description: STG Decreased Risk of Fall With mod Assistance. Outcome: Progressing    Problem: RH COGNITION-NURSING Goal: RH STG USES MEMORY AIDS/STRATEGIES W/ASSIST TO PROBLEM SOLVE Description: STG Uses Memory Aids/Strategies With mod Assistance to Problem Solve. Outcome: Progressing Goal: RH STG ANTICIPATES NEEDS/CALLS FOR ASSIST W/ASSIST/CUES Description: STG Anticipates Needs/Calls for Assist With mod Assistance/Cues. Outcome: Progressing   Problem: RH PAIN MANAGEMENT Goal: RH STG PAIN MANAGED AT OR BELOW PT'S PAIN GOAL Description: <4 on a 0-10 pain scale. Outcome: Progressing   Problem: RH PAIN MANAGEMENT Goal: RH STG PAIN MANAGED AT OR BELOW PT'S PAIN GOAL Description: <4 on a 0-10 pain scale. Outcome: Progressing   Problem: RH KNOWLEDGE DEFICIT BRAIN INJURY Goal: RH STG INCREASE KNOWLEDGE OF SELF CARE AFTER BRAIN INJURY Description: Patient will demonstrate knowledge of medication management, weight bearing precautions, skin and wound care, safety precautions, and follow up care with the MD post discharge wit min assist from CIR staff. Outcome: Progressing

## 2020-03-06 NOTE — Patient Care Conference (Signed)
Inpatient RehabilitationTeam Conference and Plan of Care Update Date: 03/06/2020   Time: 10:14 AM    Patient Name: Christopher Marks      Medical Record Number: [id number]  Date of Birth: [birth year] Sex: Male         Room/Bed: 4W09C/4W09C-01 Payor Info: Payor: MED PAY / Plan: MED PAY ASSURANCE / Product Type: *No Product type* /    Admit Date/Time:  02/15/2020  5:08 PM  Primary Diagnosis:  Ischemic cerebrovascular accident (CVA) of frontal lobe [facility])  Hospital Problems: Principal Problem:   Ischemic cerebrovascular accident (CVA) of frontal lobe [facility]) Active Problems:   Fracture    Expected Discharge Date: Expected Discharge Date: 03/12/20  Team Members Present: Physician leading conference: Dr. Faith Rogue Care Coodinator Present: Lavera Guise, BSW;Marvon [place] Marlyne Beards, RN, BSN, CRRN Nurse Present: Other (comment) Joycelyn Das, RN) PT Present: Serina Cowper, PT OT Present: Jake Shark, OT SLP Present: Feliberto Gottron, SLP PPS Coordinator present : Edson Snowball, Park Breed, SLP     Current Status/Progress Goal Weekly Team Focus  Bowel/Bladder   Pt is continent/incontinent of B/B LBM 11/22  regain continence of B/B  assess toileting needs q2h   Swallow/Nutrition/ Hydration   Regular textures with thin liquids, Supervision  Mod I  increase use of swallowing compensatory strategies, tolerance of upgraded textures   ADL's   CGA ADL transfers, CGA UB ADLs, min A LB dressing, requires cueing for weightbearing adherence  min A overall  RUE NMR, cognitive retraining, transfer training, family education/d/c planning   Mobility             Communication             Safety/Cognition/ Behavioral Observations  Min A  Supervision  selective attention, complex problem solving, emergent awareness of errors, recall   Pain   c/o H/A relieved with current medication  pt will be free of pain  assess pain qshift and prn   Skin   healing abrasion to left arm surgical  incision to RLE  skin free of breakdown or infection  assess skin qshift and prn     Discharge Planning:  Pt is uninsured. D/c to grandmother's home who will be primary caregiver. Assistance from grandmother and girlfriend. [facility] HHPT/OT/SLP referral sent to [facility] and waiting on updates.   Team Discussion: Refusing some medications, MD aware and will make appropriate adjustments. Continent B/B with an occasional incontinent episode. OT reports patient having so carry over problems with weight bearing precautions. PT reports patient needs more assist and has been arguing more with nursing. SLP reports patient is working on complex problem solving, emergent awareness of errors, and recall. Goals are supervision.  Patient on target to meet rehab goals: yes, but would benefit from a few extra days.  *See Care Plan and progress notes for long and short-term goals.   Revisions to Treatment Plan:  MD to make appropriate medication adjustments.  Teaching Needs: Continue with family education.  Current Barriers to Discharge: Incontinence, Wound care, Weight bearing restrictions and Behavior  Possible Resolutions to Barriers: Continue to work on continence, provide education on wound, incision care if needed, continue education on weight bearing precautions, provide emotional support.     Medical Summary Current Status: right tib fracture healing--WBAT for now. pain improved. constipation. mood can be labile at times.  Barriers to Discharge: Behavior   Possible Resolutions to Barriers/Weekly Focus: supervision at discharge.   Continued Need for Acute Rehabilitation Level of Care: The patient requires daily medical  management by a physician with specialized training in physical medicine and rehabilitation for the following reasons: Direction of a multidisciplinary physical rehabilitation program to maximize functional independence : Yes Medical management of patient stability for  increased activity during participation in an intensive rehabilitation regime.: Yes Analysis of laboratory values and/or radiology reports with any subsequent need for medication adjustment and/or medical intervention. : Yes   I attest that I was present, lead the team conference, and concur with the assessment and plan of the team.   Tennis Must 03/06/2020, 1:03 PM

## 2020-03-06 NOTE — Progress Notes (Signed)
Physical Therapy Session Note  Patient Details  Name: Christopher Marks MRN: [id number] Date of Birth: [birth year]  Today's Date: 03/06/2020 PT Individual Time: 4196-2229 PT Individual Time Calculation (min): 55 min   Short Term Goals: Week 3:  PT Short Term Goal 1 (Week 3): Patient will perform bed mobility with supervision consistently. PT Short Term Goal 2 (Week 3): Patient will perform basic transfers with CGA maintaining NWB of R lower extremity with min cues. PT Short Term Goal 3 (Week 3): Patient will ambulate 10 ft with RW while maintaining R lower extremity NWB with mod A.  Skilled Therapeutic Interventions/Progress Updates:    Pt received asleep, supine in bed with the covers pulled over his head. Therapist awakens pt with gentle voice and motivation to participate in therapy session with pt agreeable. Supine>sitting L EOB, HOB slightly elevated, with close supervision for safety. Sitting EOB donned shirt with min assist to get over head and pt demoing proper sequencing of hemi-technique. Therapist educated pt on updated R LE WBAT precautions and implications to mobility tasks. L stand pivot EOB>w/c using RW with min assist for balance due to posterior lean upon coming to stand - encouraged pt to progress to WBAT on R LE as pt initially performs with NWB holding R LE off floor.  Transported to/from gym in w/c for time management and energy conservation. Gait training ~51ft x2, ~80ft using RW with CGA/min assist - pt started with very little R LE WBing and step-to pattern leading with R LE but with cuing able to progress to reciprocal stepping pattern and increased R LE WBing with pt reporting no pain until final walk when pt started to demonstrate antalgic pattern - pt demos ability to advance R LE during swing phase without assist though lacks full hip/knee flexion. Pt reports fatigue but with encouragement agreeable to continue participating in session. Dynamic sitting balance and R UE NMR  task via ball toss into rebounder with pt tossing it 30x in 30seconds demonstrating increasing incorporation of R UE into task with improved motor control/coordination. Pt starts to fall asleep while sitting on EOM and requests to return to bed - pt reports not sleeping well last night. R stand pivot EOM>w/c using RW with min assist and cuing for sequencing. Transported back to room. L squat pivot w/c>EOB with CGA for steadying. Sit>supine with supervision and pt left supine in bed with needs in reach and bed alarm on.  Therapy Documentation Precautions:  Precautions Precautions: Fall Precaution Comments: R-sided hemi Restrictions Weight Bearing Restrictions: Yes RLE Weight Bearing: Weight bearing as tolerated  Pain:   Reports onset of R LE pain with increased ambulation distance, unrated, - limited gait distance and provided seated rest break for pain management.   Therapy/Group: Individual Therapy  Ginny Forth , PT, DPT, CSRS  03/06/2020, 7:55 AM

## 2020-03-06 NOTE — Progress Notes (Signed)
Occupational Therapy Session Note  Patient Details  Name: KERI TAVELLA MRN: [id number] Date of Birth: 07-27-1990  Today's Date: 03/06/2020 OT Individual Time: 1335-1419 OT Individual Time Calculation (min): 44 min    Short Term Goals: Week 3:  OT Short Term Goal 1 (Week 3): Pt will thread BLE into pants with no physicalA OT Short Term Goal 2 (Week 3): Pt will recall hemi dressing techniques wiht MIN quesiton cues OT Short Term Goal 3 (Week 3): Pt will complete toilet transfer with MIN A and good adherene to WB precautions OT Short Term Goal 4 (Week 3): Pt will manage pants past hips prior to toileting with MIN A only for balance  Skilled Therapeutic Interventions/Progress Updates:    Pt's girlfriend and grandmother in for family education this session. The pt was oriented to month, but not day of the month or week.  He was able to transfer to the EOB with supervision and donn a pullover shirt at supervision level as well.  He then donned his gripper socks with increased time but with supervision.  Slight difficulty still noted with dressing the RLE.  He was able to complete stand pivot transfer to the wheelchair with use of the RW for support.  He was then taken down to the tub shower room in the ADL apartment where he completed two tub transfers using the tub bench and RW and min guard assist.  His girlfriend was able to return demonstrate safe assist with task using the gait belt.  Discussed need for shower bench, which will be purchased prior to discharge.  Also recommended shower mat for in the tub as well as hand held shower.  Educated family as well on using the same technique for transfers to the toilet but did not attempt.  Also discussed the need to let pt sequence bathing and dressing tasks and provide questioning cueing as needed and then direct cueing if he is not able to sequence the task.  They voiced understanding of this.   Finished session with return to the room and with pt  sitting up in the wheelchair with call button and phone in reach and safety belt in place.    Therapy Documentation Precautions:  Precautions Precautions: Fall, Other (comment) Precaution Comments: R-sided hemiparesis Restrictions Weight Bearing Restrictions: No RLE Weight Bearing: Weight bearing as tolerated  Pain: Pain Assessment Pain Scale: 0-10 Pain Score: 0-No pain ADL: See Care Tool Section for some details of mobility and selfcare  Therapy/Group: Individual Therapy  Abygale Karpf OTR/L 03/06/2020, 4:08 PM

## 2020-03-06 NOTE — Progress Notes (Signed)
Physical Therapy Session Note  Patient Details  Name: Christopher Marks MRN: [id number] Date of Birth: February 05, 1991  Today's Date: 03/06/2020 PT Individual Time: 1445-1540 PT Individual Time Calculation: 55 min  Short Term Goals: Week 3:  PT Short Term Goal 1 (Week 3): Patient will perform bed mobility with supervision consistently. PT Short Term Goal 2 (Week 3): Patient will perform basic transfers with CGA maintaining NWB of R lower extremity with min cues. PT Short Term Goal 3 (Week 3): Patient will ambulate 10 ft with RW while maintaining R lower extremity NWB with mod A.  Skilled Therapeutic Interventions/Progress Updates:     Patient in w/c with his girlfriend and grandmother in the room upon PT arrival. Patient alert and agreeable to PT session. Patient reported progressive R lower extremity soreness with weight bearing during session, RN made aware. PT provided repositioning, rest breaks, and distraction as pain interventions throughout session. Patient's family participated in hands on training and family education.   Therapeutic Activity: Bed Mobility: Patient performed supine sit to supine with supervision. Provided verbal cues for positioning in the middle of the bed for safety. Transfers: Patient performed sit to/from stand x3 with CGA weight bearing through B lower extremities. Provided verbal cues for hand placement, touching B legs to seat and reaching back before sitting for safety, and waiting for assist before standing, stood x1 impulsively during session, patient's girlfriend appropriately intervened and assist patient back to sitting providing appropriate cues. Patient performed a simulated mid size SUV height car transfer with min A-CGA using RW. Provided cues for safe technique.  Gait Training:  Patient ambulated 40 feet using RW with CGA-min A. Ambulated with step-to gait pattern with limited weight bearing on his R foot, but demonstrated good advancement and foot  clearance with that foot throughout gait. Provided verbal cues for AD management, sequencing, and encouraged increased weight bearing as tolerated. Patient ascended/descended 4-6" steps using B rails x1 and L rail with B upper extremities x1 with min A. Performed step-to gait pattern leading with L while ascending and R while descending. Provided cues and demonstration for technique and heavy cues for sequencing. Demonstrated safe guarding technique and family performed safe guarding with PT as the patient. Will trial with patient this weekend, prior to d/c.  Wheelchair Mobility:  Patient was transported in the w/c with total A throughout session for energy conservation and time management.  Educated patient and family on general TBI recovery and symptoms. Instructed to provide a consistent daily schedule, including going to sleep and waking up at the same time each day. Educated on importance of sleep for recovery. Discussed symptoms of agitation, poor frustration tolerance, and sudden changes in mood that have been observed during patient's stay. Informed them that these are common following TBI and provided strategies to redirect and deescalate patient when these symptoms present. Educated patient and family on fall risk/prevention, home modifications to prevent falls, and activation of emergency services in the event of a fall during session.   Patient in bed with his family preparing to leave the room at end of session with breaks locked, bed alarm set, and all needs within reach.   Therapy Documentation Precautions:  Precautions Precautions: Fall, Other (comment) Precaution Comments: R-sided hemiparesis Restrictions Weight Bearing Restrictions: No RLE Weight Bearing: Weight bearing as tolerated   Therapy/Group: Individual Therapy  Elray Dains L Agness Sibrian PT, DPT  03/06/2020, 5:38 PM

## 2020-03-06 NOTE — Progress Notes (Signed)
Patient ID: MONTARIUS KITAGAWA, male   DOB: 1990/07/01, 29 y.o.   MRN: [id number]  Pt grandmother [name] and girlfriend Rodney Booze present today for family edu. SW provided updates from team conference, and new d/c date 11/29. Pt girlfriend states she has following DME: RW, w/c. She intends to work on getting TTB and 3in1 BSC for pt. SW informed will inform if pt will be accepted for charity HH.  *Family did not need cab voucher to home as girlfriend was able to get her brother to take them home.  Sw received updates from [facility] reporting pt was declined for charity HH due to history of alcohol abuse, current non-compliance and issues with retaining information. SW informed medical team and requested HEP for pt at discharge.   [name], MSW, LCSWA Office: [phone] Cell: [phone] Fax: [phone]

## 2020-03-07 ENCOUNTER — Inpatient Hospital Stay (HOSPITAL_COMMUNITY): Payer: Self-pay

## 2020-03-07 DIAGNOSIS — R339 Retention of urine, unspecified: Secondary | ICD-10-CM

## 2020-03-07 DIAGNOSIS — R7989 Other specified abnormal findings of blood chemistry: Secondary | ICD-10-CM

## 2020-03-07 LAB — HEPATIC FUNCTION PANEL
ALT: 68 U/L — ABNORMAL HIGH (ref 0–44)
AST: 33 U/L (ref 15–41)
Albumin: 3 g/dL — ABNORMAL LOW (ref 3.5–5.0)
Alkaline Phosphatase: 135 U/L — ABNORMAL HIGH (ref 38–126)
Bilirubin, Direct: 0.2 mg/dL (ref 0.0–0.2)
Indirect Bilirubin: 1 mg/dL — ABNORMAL HIGH (ref 0.3–0.9)
Total Bilirubin: 1.2 mg/dL (ref 0.3–1.2)
Total Protein: 6.9 g/dL (ref 6.5–8.1)

## 2020-03-07 MED ORDER — SORBITOL 70 % SOLN: 30.0000 mL | Freq: Every day | Status: DC | PRN

## 2020-03-07 MED ORDER — BETHANECHOL CHLORIDE 10 MG PO TABS
10.0000 mg | ORAL_TABLET | Freq: Three times a day (TID) | ORAL | Status: DC
  Administered 2020-03-07 – 2020-03-09 (×7): 10 mg via ORAL
  Filled 2020-03-07 (×7): qty 1

## 2020-03-07 NOTE — Progress Notes (Addendum)
Patient ID: Christopher Marks, male   DOB: Sep 03, 1990, 29 y.o.   MRN: [id number]  03/06/2020- SW spoke with [name]/[facility] Dir of RN [phone])   to provide updates from team conference, and to inform on change in pt d/c date to 11/29. SW faxed clinicals updates [phone] for review.  03/07/2020- SW followed up Judeth Cornfield to confirm she received clinical updates. States the correctional facility is unsure if they can provide care to pt, and would like to know if we can hold off d/c. SW informed pt d/c date will not change as pt has made gains and has been informed that he will be d/c on Monday. States she will see what updates she is able to get, and will follow-up with SW today or on Friday.   SW spoke with Lt. Tunstall/[facility] Watch Commander [phone]) to inform on above. Reports they are working on a plan with possible transport with [facility]; there will be updates on d/c plan. SW spoke made effors to make contact with [facility] [phone]) to provide updates on pt d/c, however, unable to get connected with correct resource. SW will follow-up with Watch Commander on Friday to get updates on plan of care.   [name], MSW, LCSWA Office: [phone] Cell: [phone] Fax: [phone]
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
## 2020-03-07 NOTE — Progress Notes (Signed)
Physical Therapy Session Note  Patient Details  Name: Christopher Marks MRN: [id number] Date of Birth: 1990-08-16  Today's Date: 03/07/2020 PT Individual Time: 1500-1545 PT Individual Time Calculation (min): 45 min   Short Term Goals: Week 3:  PT Short Term Goal 1 (Week 3): Patient will perform bed mobility with supervision consistently. PT Short Term Goal 2 (Week 3): Patient will perform basic transfers with CGA maintaining NWB of R lower extremity with min cues. PT Short Term Goal 3 (Week 3): Patient will ambulate 10 ft with RW while maintaining R lower extremity NWB with mod A.  Skilled Therapeutic Interventions/Progress Updates:     Patient in bed asleep upon PT return after 30 min of rest for patient. Patient easily aroused and agreeable to PT session. Patient reported moderate neck stiffness/soreness during session, RN made aware. PT provided repositioning, rest breaks, and distraction as pain interventions throughout session.   Therapeutic Activity: Bed Mobility: Patient performed supine to sit with supervision for safety. Transfers: Patient performed sit to/from stand x2 with CGA progressing to close supervision with and without RW. Provided verbal cues for controlled descent and safety awareness.  Gait Training:  Patient ambulated ~80 feet x2 using RW with CGA. Ambulated with step-to progressing to step-through gait pattern with improved weight bearing on R. Reported gastroc stretch with reciprocal gait pattern. Provided verbal cues for shoulder depression, step-through gait pattern as tolerated for increased weight bearing and more normalized gait pattern, and visual scanning to avoid objects and improve awareness/safety.  Neuromuscular Re-ed: Patient performed the following standing balance and R upper extremity motor control activities: -Wii bowling x10 frames in standing with seated rest breaks between each turn, initially with single upper extremity support then no upper  extremity support for balance training, also focused on R arm extension with activity, demonstrating improved elbow extension and shoulder flexion AROM throughout -sit to stand x10 progressing from RW to no RW for improved balance with functional mobility, focused on equal weight bearing as tolerated  Patient in recliner in the room at end of session with breaks locked, seat belt alarm set, and all needs within reach. Educated on importance of maintaining sleep/wake cycle and encouraged patient to turn off devices and tell family not to call after 9pm to improve night time sleeping. Patient in agreement and stated understanding.    Therapy Documentation Precautions:  Precautions Precautions: Fall, Other (comment) Precaution Comments: R-sided hemiparesis Restrictions Weight Bearing Restrictions: No RLE Weight Bearing: Weight bearing as tolerated General: PT Amount of Missed Time (min): 30 Minutes PT Missed Treatment Reason: Patient fatigue   Therapy/Group: Individual Therapy  [name] L Graylen Noboa PT, DPT  03/07/2020, 5:16 PM

## 2020-03-07 NOTE — Progress Notes (Signed)
Patient ID: GRANITE GODMAN, male   DOB: Apr 28, 1990, 29 y.o.   MRN: [id number]  SW called pt grandmother [name] [phone]) to inform on decline for charity HH. SW informed pt will be sent home with HEP. Grandmother asked about DME pt still needed per Tasha/girlfriend: 3in1 BSC and TTB. SW explained charity DME and flat rate cost for items ($60 per item). She is amenable to charity DME if pt is not approved.   SW sent DME orders to [facility] via parachute.   [name], MSW, LCSWA Office: [phone] Cell: [phone] Fax: [phone]

## 2020-03-07 NOTE — Progress Notes (Signed)
Physical Therapy Note  Patient Details  Name: Christopher Marks MRN: [id number] Date of Birth: 03/22/1991 Today's Date: 03/07/2020    Patient in bed asleep with covers over his head upon PT arrival. Patient reporting extreme fatigue and frustration with family calling and staff waking him up when he is sleeping at night and during the day. Patient agreeable to sleep with "do not disturb" signs on door for 30 min, then participate in PT session this afternoon.    Lawerance Matsuo L Surie Suchocki PT, DPT  03/07/2020, 2:42 PM
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
## 2020-03-07 NOTE — Progress Notes (Signed)
Occupational Therapy Session Note  Patient Details  Name: Christopher Marks MRN: [id number] Date of Birth: 1990-09-08  Today's Date: 03/07/2020 OT Individual Time: 1100-1200 OT Individual Time Calculation (min): 60 min    Short Term Goals: Week 3:  OT Short Term Goal 1 (Week 3): Pt will thread BLE into pants with no physicalA OT Short Term Goal 2 (Week 3): Pt will recall hemi dressing techniques wiht MIN quesiton cues OT Short Term Goal 3 (Week 3): Pt will complete toilet transfer with MIN A and good adherene to WB precautions OT Short Term Goal 4 (Week 3): Pt will manage pants past hips prior to toileting with MIN A only for balance  Skilled Therapeutic Interventions/Progress Updates:    Pt received supine in bed, sleeping but easily awoken. Pt c/o feeling sick, did not specify how/where. Pt initially declining session but then stated "a shower would feel really good". OT left room to gather supplies and upon return, NT present and reporting pt was attempting to get OOB by himself. Edu provided on need to wait for assistance. Pt completed sit > stand with supervision from EOB. CGA for ambulatory transfer with RW into bathroom. Pt weightbearing well through RLE and with no increased pain per report. Pt required min cueing for sequencing doffing of pants and to remove distal part while seated. Bathing completed seated on BSC with lateral leans, set up assist overall with pt requesting min A to wash R side of neck. Pt required min A to don shirt, incorrectly sequencing steps and orientation. Min A to don underwear, but with cueing pt able to don pants with CGA- although again he incorrectly oriented them the first attempt. Mod A to don socks. Pt was taken to the therapy gym to work with the [place] system. Pt used his RUE with min-mod facilitation for functional reaching, sequencing numbers A-Q. Increased difficulty with shoulder flexion and elbow extension. Pt returned to his room and was left supine  with all needs met, bed alarm set.   Therapy Documentation Precautions:  Precautions Precautions: Fall, Other (comment) Precaution Comments: R-sided hemiparesis Restrictions Weight Bearing Restrictions: No RLE Weight Bearing: Weight bearing as tolerated  Therapy/Group: Individual Therapy  Curtis Sites 03/07/2020, 6:33 AM

## 2020-03-07 NOTE — Progress Notes (Signed)
Pt states he rested well through out the night. Pt c/o 8/10 neck pain when standing this morning. Pt states " I think I just slept on it wrong." Pt was given PRN Oxy for the pain. Pt assisted back to the bed. Pt resting in bed at this time. Call light in reach, bed alarm on.

## 2020-03-07 NOTE — Plan of Care (Signed)
  Problem: Sit to Stand Goal: LTG:  Patient will perform sit to stand with assistance level (PT) Description: LTG:  Patient will perform sit to stand with assistance level (PT) Flowsheets (Taken 03/07/2020 1726) LTG: PT will perform sit to stand in preparation for functional mobility with assistance level: (Upgraded goal due to patient's progress following increased weight bearing precautions on R LE.) Supervision/Verbal cueing Note: Upgraded goal due to patient's progress following increased weight bearing precautions on R LE.    Problem: RH Ambulation Goal: LTG Patient will ambulate in controlled environment (PT) Description: LTG: Patient will ambulate in a controlled environment, # of feet with assistance (PT). Flowsheets (Taken 03/07/2020 1726) LTG: Pt will ambulate in controlled environ  assist needed:: (Upgraded goal due to patient's progress following increased weight bearing precautions on R LE.) Contact Guard/Touching assist LTG: Ambulation distance in controlled environment: >100 ft Note: Upgraded goal due to patient's progress following increased weight bearing precautions on R LE.    Problem: RH Ambulation Goal: LTG Patient will ambulate in home environment (PT) Description: LTG: Patient will ambulate in home environment, # of feet with assistance (PT). Flowsheets (Taken 03/07/2020 1730) LTG: Pt will ambulate in home environ  assist needed:: (Added goal due to patient's progress following increased weight bearing precautions on R LE.) Contact Guard/Touching assist LTG: Ambulation distance in home environment: 50 ft using LRAD Note: Added goal due to patient's progress following increased weight bearing precautions on R LE.

## 2020-03-07 NOTE — Progress Notes (Signed)
Physical Therapy Session Note  Patient Details  Name: WYLAN GENTZLER MRN: [id number] Date of Birth: Nov 13, 1990  Today's Date: 03/07/2020 PT Individual [id number]  - 0950, 75 min    Short Term Goals:  Week 3:  PT Short Term Goal 1 (Week 3): Patient will perform bed mobility with supervision consistently. PT Short Term Goal 2 (Week 3): Patient will perform basic transfers with CGA maintaining NWB of R lower extremity with min cues. PT Short Term Goal 3 (Week 3): Patient will ambulate 10 ft with RW while maintaining R lower extremity NWB with mod A.     Skilled Therapeutic Interventions/Progress Updates:  Pt resting in bed; he denied pain.  He stated that he was "high" because he had just had 2 muscle relaxants and didn't think he had any therapy today.  PT reviewed his schedule with him, using paper hand out.  Pt donned hospital shirt once PT handed it to him.  Supine> sit iwht supervision using bed rail.  Squat pivot iwht close supervision to w/c.  Pt requested wahsing his face and brushing teeth before leaving room, which he did with set up.  neuromuscular re-education via forced use, multimodal cues for alternating reciprocal movement, at resistance 20 cm/sec x 2 minutes targeting quadriceps, x 30 seconds x 2 targeting gluteal muscles.  Cues for full extension of R foot plate to floor but no c/o pain RLE.  Forced use bil UEs propelling wc, x 30' x 2 with min assist needed for steering due to limited pushing by RUE  Sit> stand with min assist for balance.  Gait training with RW on level tile x 120' with CGA; cues for safe placement of RW during stand> sit.  Pt requested using UBE.  UBE from wc level,  at resistance 5.3 x 2 minutes forward, x 4 minutes backwards.  No assistance needed for pt to hold hand grip with R hand.  Pt tired and declined further ambulation.  Stand pivot with RW to return to bed, CGA.  Sit> supine with supervision.  Pt stated that he is cold but does not want to  use bed covers; he requested room temperature be set at 74 degrees/.  PT set temp at 74 degrees.  At end of session, pt resting in bed and needs in place.  Bed alarm set.     Therapy Documentation Precautions:  Precautions Precautions: Fall, Other (comment) Precaution Comments: R-sided hemiparesis Restrictions Weight Bearing Restrictions: No RLE Weight Bearing: Weight bearing as tolerated         Therapy/Group: Individual Therapy  Darreld Hoffer 03/07/2020, 7:53 AM

## 2020-03-08 DIAGNOSIS — G441 Vascular headache, not elsewhere classified: Secondary | ICD-10-CM

## 2020-03-08 DIAGNOSIS — R000 Tachycardia, unspecified: Secondary | ICD-10-CM

## 2020-03-08 DIAGNOSIS — D62 Acute posthemorrhagic anemia: Secondary | ICD-10-CM

## 2020-03-08 DIAGNOSIS — G8918 Other acute postprocedural pain: Secondary | ICD-10-CM

## 2020-03-08 DIAGNOSIS — F1011 Alcohol abuse, in remission: Secondary | ICD-10-CM

## 2020-03-08 DIAGNOSIS — T07XXXA Unspecified multiple injuries, initial encounter: Secondary | ICD-10-CM

## 2020-03-08 DIAGNOSIS — K5903 Drug induced constipation: Secondary | ICD-10-CM

## 2020-03-08 DIAGNOSIS — R7401 Elevation of levels of liver transaminase levels: Secondary | ICD-10-CM

## 2020-03-08 MED ORDER — MUSCLE RUB 10-15 % EX CREA
TOPICAL_CREAM | CUTANEOUS | Status: DC | PRN
  Filled 2020-03-08: qty 85

## 2020-03-08 NOTE — Progress Notes (Signed)
03/08/20 1122  What Happened  Was fall witnessed? No  Was patient injured? No  Patient found on floor  Found by Staff-comment (Shamaine-NT)  Stated prior activity other (comment) (stated he was looking for his tablet, that was in bed w/ pt)  Follow Up  MD notified Dr. Allena Katz  Time MD notified 1130  Family notified Yes - comment Loma Messing)  Time family notified 1135  Additional tests No  Simple treatment Other (comment) (none needed)  Progress note created (see row info) Yes  Adult Fall Risk Assessment  Risk Factor Category (scoring not indicated) Fall has occurred during this admission (document High fall risk)  Age 29  Fall History: Fall within 6 months prior to admission 0  Elimination; Bowel and/or Urine Incontinence 0  Elimination; Bowel and/or Urine Urgency/Frequency 0  Medications: includes PCA/Opiates, Anti-convulsants, Anti-hypertensives, Diuretics, Hypnotics, Laxatives, Sedatives, and Psychotropics 5  Patient Care Equipment 0  Mobility-Assistance 2  Mobility-Gait 2  Mobility-Sensory Deficit 0  Altered awareness of immediate physical environment 1  Impulsiveness 2  Lack of understanding of one's physical/cognitive limitations 4  Total Score 16  Patient Fall Risk Level High fall risk  Adult Fall Risk Interventions  Required Bundle Interventions *See Row Information* High fall risk - low, moderate, and high requirements implemented  Additional Interventions Use of appropriate toileting equipment (bedpan, BSC, etc.);Reorient/diversional activities with confused patients  Screening for Fall Injury Risk (To be completed on HIGH fall risk patients) - Assessing Need for Low Bed  Risk For Fall Injury- Low Bed Criteria Previous fall this admission  Will Implement Low Bed and Floor Mats Yes  Screening for Fall Injury Risk (To be completed on HIGH fall risk patients who do not meet crieteria for Low Bed) - Assessing Need for Floor Mats Only  Risk For Fall Injury- Criteria  for Floor Mats Noncompliant with safety precautions  Will Implement Floor Mats Yes  Vitals  Temp 97.8 F (36.6 C)  BP 120/77  MAP (mmHg) 90  BP Location Left Arm  BP Method Automatic  Patient Position (if appropriate) Lying  Pulse Rate 86  Pulse Rate Source Monitor  Resp 20  Oxygen Therapy  SpO2 100 %  O2 Device Room Air  Pain Assessment  Pain Scale 0-10  Faces Pain Scale 4  Pain Type Acute pain  Pain Location Neck  Pain Orientation Posterior  Pain Descriptors / Indicators Aching  Pain Frequency Constant  Pain Onset On-going  Pain Intervention(s) Repositioned  Multiple Pain Sites No  Neurological  Neuro (WDL) X  Level of Consciousness Alert  Orientation Level Oriented to person;Oriented to place  Cognition Impulsive;Poor attention/concentration;Poor judgement;Poor safety awareness  Speech Delayed responses  Pupil Assessment  No  R Hand Grip Weak  L Hand Grip Moderate   RUE Motor Response Responds to commands  RUE Sensation Decreased  RUE Motor Strength 2  LUE Motor Response Purposeful movement  LUE Sensation Full sensation  LUE Motor Strength 4  RLE Motor Response Purposeful movement  RLE Sensation Decreased  RLE Motor Strength 3  LLE Motor Response Purposeful movement  LLE Sensation Full sensation  LLE Motor Strength 4  Neuro Symptoms Fatigue;Forgetful  Neuro symptoms relieved by Rest  Musculoskeletal  Musculoskeletal (WDL) X  Assistive Device Wheelchair;Front wheel walker  Generalized Weakness Yes  Weight Bearing Restrictions No  RLE Weight Bearing WBAT  Integumentary  Integumentary (WDL) X  Skin Color Appropriate for ethnicity  Skin Condition Dry  Skin Integrity Intact  Abrasion Location Arm (healing)  Pt climbed out of bed and found sitting on the floor by NT. Pt stated he was looking for his tablet, although tablet was in bed. No injury noted. Pt denies hitting head, or hurting self. Oriented to floor fall policy and reminded pt to call for  assistance. Informed pt of the need for telesitter camera for safety. Pt refused stating he would "rip the camera off" if we bring telesitter in. Pt is currently in agreement to call for assistance for any needs and all belongings are within reach. Continue plan of care  Marylu Lund, RN
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
## 2020-03-09 ENCOUNTER — Inpatient Hospital Stay (HOSPITAL_COMMUNITY): Payer: Self-pay

## 2020-03-09 ENCOUNTER — Inpatient Hospital Stay (HOSPITAL_COMMUNITY): Payer: Self-pay | Admitting: Speech Pathology

## 2020-03-09 NOTE — Progress Notes (Signed)
Patient ID: PEARSON PICOU, male   DOB: July 29, 1990, 29 y.o.   MRN: [id number]  03/07/2020 5:06pm- SW spoke with [name]/[facility] Dir of RN [phone]) to discuss updates in which she states pt will not be transitioned to the detention center. SW will follow-up with watch commander on Friday.   03/09/2020- SW spoke with Lt. Randazzo/Watch [phone]) to discuss above. Reports he will look into this and follow-up with SW.  *Reports on Monday 11/29 the watch commander and magistrate will come up here to serve patient, and pt will have an unsecured bond and a court date. Anticipated they will arrived between 8:30am-9:30am. SW updated medical team.   [name], MSW, LCSWA Office: [phone] Cell: [phone] Fax: [phone]

## 2020-03-09 NOTE — Progress Notes (Signed)
Answered bed alarm. Pt up with walker by himself. Assisted pt to get urinal and assisted pt in voiding while standing with walker. Pt then instructed on importance of calling before getting up for his safety. Pt disregarded conversation and teaching. Pt then gets on phone with family member and tells family member he called though pt did not call

## 2020-03-09 NOTE — Progress Notes (Signed)
Physical Therapy Weekly Progress Note  Patient Details  Name: Christopher Marks MRN: [id number] Date of Birth: 16-Sep-1990  Beginning of progress report period: March 01, 2020 End of progress report period: March 09, 2020  Today's Date: 03/09/2020 PT Individual Time: 1010-1110 PT Individual Time Calculation (min): 57 min   Patient has met 3 of 3 short term goals.  Patient with great progress this week with clearance from ortho for WBAT on R lower extremity. He continues to have decreased safety awareness, decreased attention, decreased frustration tolerance, and decreased problem solving, exasperated by fatigue. He currently requires supervision for bed mobility, CGA for transfers and gait >80 ft with RW, and min A 4 steps 1 rail with B upper extremity support. Patient's family has completed 1 session of family education this week and plans to attend therapy this weekend for continued education.   Patient continues to demonstrate the following deficits muscle weakness and muscle joint tightness, decreased cardiorespiratoy endurance, abnormal tone and decreased motor planning, decreased attention to right and decreased motor planning, decreased attention, decreased awareness, decreased problem solving, decreased safety awareness and decreased memory and decreased standing balance, decreased postural control, hemiplegia and decreased balance strategies and therefore will continue to benefit from skilled PT intervention to increase functional independence with mobility.  Patient progressing toward long term goals..  Plan of care revisions: Upgraded LTGs on 11/24 due to patient's progress with mobility following clearance for WBAT on R lower extremity, see Care Plan for details.  PT Short Term Goals Week 3:  PT Short Term Goal 1 (Week 3): Patient will perform bed mobility with supervision consistently. PT Short Term Goal 1 - Progress (Week 3): Met PT Short Term Goal 2 (Week 3): Patient will  perform basic transfers with CGA maintaining NWB of R lower extremity with min cues. PT Short Term Goal 2 - Progress (Week 3): Partly met (Patient is now WBAT on R LE, performing transfers with CGA consistently with RW.) PT Short Term Goal 3 (Week 3): Patient will ambulate 10 ft with RW while maintaining R lower extremity NWB with mod A. PT Short Term Goal 3 - Progress (Week 3): Partly met (Patient is now WBAT on R LE, performing gait >80 ft with CGA consistently with RW.) Week 4:  PT Short Term Goal 1 (Week 4): STG=LTG due to ELOS  Skilled Therapeutic Interventions/Progress Updates:     Patient in bed upon PT arrival. Patient alert and agreeable to PT session. Patient denied pain during session.  Therapeutic Activity: Bed Mobility: Patient performed supine to sit with supervision with min use of a bed rail in a flat bed. Provided verbal cues for reducing use of bed rail to simulate home set up. Transfers: Patient performed sit to/from stand x5 with CGA-close supervision using a RW. Provided verbal cues for reaching back to sit and placing B legs against the seat before sitting for safety.  Gait Training:  Patient ambulated 200 feet using RW with CGA and w/c follow for safety. Ambulated with step-to gait pattern progressing to step-through gait pattern with cues and demonstration without increased pain in R lower extremity. Continues to ambulate with increased R knee flexion and decreased stance time in R stance. Provided verbal cues for reciprocal gait pattern, increased R knee extension, and increased R weight shift for improved gait mechanics and increased R lower extremity motor control. Patient ascended/descended 4 steps using L rail with B hands on the rail with min A. Performed step-to gait pattern leading with L  while ascending and R while descending. Provided cues for technique and sequencing.   Wheelchair Mobility:  Patient propelled wheelchair >200 feet with supervision. Provided verbal  cues for bringing R hand back on the wheel for increased R propulsion to reduce veering R and visual scanning to avoid objects on the R.   Therapeutic Exercise: Patient performed the following exercises from HE[facility], provided with handout with verbal and tactile cues for proper technique.  Seated Long Arc Quad - 3 x daily - 7 x weekly - 3 sets - 10 reps Standing Hamstring Curl with Chair Support - 3 x daily - 7 x weekly - 3 sets - 10 reps Standing Heel Raises - 3 x daily - 7 x weekly - 3 sets - 10 reps Standing Toe Raises at Chair - 3 x daily - 7 x weekly - 3 sets - 10 reps Mini Squats at Table - 3 x daily - 7 x weekly - 3 sets - 10 reps  Patient in w/c handed off the [name], SLP, at end of session with breaks locked.   Therapy Documentation Precautions:  Precautions Precautions: Fall, Other (comment) Precaution Comments: R-sided hemiparesis Restrictions Weight Bearing Restrictions: No RLE Weight Bearing: Weight bearing as tolerated  Therapy/Group: Individual Therapy  Cambree Hendrix L Lasya Vetter PT, DPT  03/09/2020, 12:47 PM

## 2020-03-09 NOTE — Discharge Instructions (Signed)
Inpatient Rehab Discharge Instructions  [facility] Discharge date and time: No discharge date for patient encounter.   Activities/Precautions/ Functional Status: Activity: Weightbearing as tolerated Diet: regular diet Wound Care: Routine skin checks Functional status:  ___ No restrictions     ___ Walk up steps independently ___ 24/7 supervision/assistance   ___ Walk up steps with assistance ___ Intermittent supervision/assistance  ___ Bathe/dress independently ___ Walk with walker     _x__ Bathe/dress with assistance ___ Walk Independently    ___ Shower independently ___ Walk with assistance    ___ Shower with assistance ___ No alcohol     ___ Return to work/school ________   COMMUNITY REFERRALS UPON DISCHARGE:    Medical Equipment/Items Ordered: 3in1 bedside commode and tub transfer bench (charity)                                                 Agency/Supplier:[facility] [id number]  GENERAL COMMUNITY RESOURCES FOR PATIENT/FAMILY: Hospital Follow-up appointment- Wednesday, December 22 at 2:30pm with Georgian Co, PA at  [facility] [address] [place], [place] [address] [phone] *Be sure to discuss financial assistance and orange card at time of appointment. If unable to make appointment, please be sure to call and reschedule.  Special Instructions:  No driving smoking alcohol or illicit drug use  My questions have been answered and I understand these instructions. I will adhere to these goals and the provided educational materials after my discharge from the hospital.  Patient/Caregiver Signature _______________________________ Date __________  Clinician Signature _______________________________________ Date __________  Please bring this form and your medication list with you to all your follow-up doctor's appointments.    _________________________________________________________________________________________________________  Information on my medicine - ELIQUIS (apixaban)  This medication education was reviewed with me or my healthcare representative as part of my discharge preparation.  \ Why was Eliquis prescribed for you? Eliquis was prescribed to treat blood clots that may have been found in the veins of your legs (deep vein thrombosis= DVT) or in your lungs (pulmonary embolism) and to reduce the risk of them occurring again.   Indication:  CVA with new DVT (left axillary vein left brachial vein 02/06/20)  What do You need to know about Eliquis ? The dose is ONE 5 mg tablet taken TWICE daily.  Eliquis may be taken with or without food.   Try to take the dose about the same time in the morning and in the evening. If you have difficulty swallowing the tablet whole please discuss with your pharmacist how to take the medication safely.  Take Eliquis exactly as prescribed and DO NOT stop taking Eliquis without talking to the doctor who prescribed the medication.  Stopping may increase your risk of developing a new blood clot.  Refill your prescription before you run out.  After discharge, you should have regular check-up appointments with your healthcare provider that is prescribing your Eliquis.    What do you do if you miss a dose? If a dose of ELIQUIS is not taken at the scheduled time, take it as soon as possible on the same day and twice-daily administration should be resumed. The dose should not be doubled to make up for a missed dose.  Important Safety Information A possible side effect of Eliquis is bleeding. You should call your healthcare provider right away if  you experience any of the following: ? Bleeding from an injury or your nose that does not stop. ? Unusual colored urine (red or dark brown) or unusual colored stools (red or black). ? Unusual bruising for unknown reasons. ? A  serious fall or if you hit your head (even if there is no bleeding).  Some medicines may interact with Eliquis and might increase your risk of bleeding or clotting while on Eliquis. To help avoid this, consult your healthcare provider or pharmacist prior to using any new prescription or non-prescription medications, including herbals, vitamins, non-steroidal anti-inflammatory drugs (NSAIDs) and supplements.  This website has more information on Eliquis (apixaban): http://www.eliquis.com/eliquis/home
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
## 2020-03-09 NOTE — Progress Notes (Signed)
Patient ID: Christopher Marks, male   DOB: 11-20-1990, 29 y.o.   MRN: [id number]  SW scheduled hospital follow-up appointment with [facility] [phone]); appt on Wednesday December 22 at 2:30pm with [facility]. SW left appointment card in pt room.  SW called pt grandmother Christopher Marks to provide above updates.   SW made confirmed DME delivered to room: TTB and 3in1 BSC.   [name], MSW, LCSWA Office: [phone] Cell: [phone] Fax: [phone]

## 2020-03-09 NOTE — Progress Notes (Signed)
Speech Language Pathology Weekly Progress and Session Note  Patient Details  Name: Christopher Marks MRN: [id number] Date of Birth: Aug 05, 1990  Beginning of progress report period: March 02, 2020 End of progress report period: March 09, 2020  Today's Date: 03/09/2020 SLP Individual Time: 1110-1203 SLP Individual Time Calculation (min): 53 min  Short Term Goals: Week 3: SLP Short Term Goal 1 (Week 3): Patient will consume current diet with minimal overt s/s of aspiration and supervision verbal cues for use of swallowing compensatory strategies. SLP Short Term Goal 1 - Progress (Week 3): Met SLP Short Term Goal 2 (Week 3): Patient will consume trials of regular textures with efficient mastication and complete oral clearance without overt s/s of aspiration over 2 sessions prior to upgrade. SLP Short Term Goal 2 - Progress (Week 3): Revised due to lack of progress SLP Short Term Goal 3 (Week 3): Patient will demonstrate sustained attention to functional tasks for ~30 minutes with Min verbal cues for redirection. SLP Short Term Goal 3 - Progress (Week 3): Met SLP Short Term Goal 4 (Week 3): Patient will recall new, daily information with Min verbal and visual cues. SLP Short Term Goal 4 - Progress (Week 3): Met SLP Short Term Goal 5 (Week 3): Patient will demonstrate functional problem solving for mildly complex and familiar tasks with Mod verbal cues. SLP Short Term Goal 5 - Progress (Week 3): Met SLP Short Term Goal 6 (Week 3): Patient will self-monitor and correct errors during functional tasks with Min verbal cues. SLP Short Term Goal 6 - Progress (Week 3): Met    New Short Term Goals: Week 4: SLP Short Term Goal 1 (Week 4): STGs=LTGs due to ELOS  Weekly Progress Updates: Patient continues to make functional gains and has met 6 of 6 STGs this reporting period. Currently, patient demonstrates behaviors consistent with a Rancho Level VII and requires overall Min A verbal cues to  complete functional and familiar tasks safely in regards to problem solving, attention, awareness and recall. Patient is also consuming regular textures with thin liquids with minimal overt s/s of aspiration and supervision verbal cues for use of swallowing compensatory strategies. Patient and family education is ongoing. Patient would benefit from continued skilled SLP intervention to maximize his swallowing and cognitive functioning prior to discharge.     Intensity: Minumum of 1-2 x/day, 30 to 90 minutes Frequency: 3 to 5 out of 7 days Duration/Length of Stay: 03/12/20 Treatment/Interventions: Cognitive remediation/compensation;Dysphagia/aspiration precaution training;Cueing hierarchy;Internal/external aids;Speech/Language facilitation;Functional tasks;Patient/family education;Medication managment;Environmental controls;Therapeutic Activities   Daily Session  Skilled Therapeutic Interventions: Skilled treatment session focused on cognitive goals. SLP faciliated session by providing supervision level verbal cues for problem solving and organization during a complex medication management task. SLP also facilitated session by re-administering the [facility] Mental Status Examination (SLUMS) and patient scored 19/30 points with a score of 27 or above considered normal. Patient continues to demonstrate deficits in problem solving and recall but his score has improved by 6 points since initial assessment. Patient requested to be transferred back to bed at end of session and required Min verbal cues for safety with task due to impulsivity. Patient left supine in bed with alarm on and all needs within reach. Continue with current plan of care.      Pain Pain Assessment Pain Scale: 0-10 Pain Score: 7-No pain Neck  RN aware, repositioning   Therapy/Group: Individual Therapy  Heidee Audi 03/09/2020, 12:35 PM

## 2020-03-09 NOTE — Progress Notes (Signed)
Occupational Therapy Weekly Progress Note  Patient Details  Name: Christopher Marks MRN: [id number] Date of Birth: 1990/08/17  Beginning of progress report period: March 02, 2020 End of progress report period: March 09, 2020  Today's Date: 03/09/2020 OT Individual Time: 1330-1445 OT Individual Time Calculation (min): 75 min    Patient has met 3 of 3 short term goals.  Pt has made significant improvements in BADLs/funcitonal mobility this week as pt is now WBAT on RLE for mobility and ADLs. Pt currently fluctuates from [name]- MIN A  with LB ADLs and S for UB ADLs. Pt would benefit from continued OT to improve independence and decrease BOC.  Patient continues to demonstrate the following deficits: muscle weakness, decreased cardiorespiratoy endurance, impaired timing and sequencing, abnormal tone, unbalanced muscle activation and decreased coordination, decreased attention to right and decreased motor planning, decreased attention, decreased awareness, decreased problem solving, decreased safety awareness and decreased memory and decreased standing balance, decreased postural control, hemiplegia and decreased balance strategies and therefore will continue to benefit from skilled OT intervention to enhance overall performance with BADL and iADL.  Patient progressing toward long term goals..  Continue plan of care.  OT Short Term Goals Week 3:  OT Short Term Goal 1 (Week 3): Pt will thread BLE into pants with no physicalA OT Short Term Goal 1 - Progress (Week 3): Met OT Short Term Goal 2 (Week 3): Pt will recall hemi dressing techniques wiht MIN quesiton cues OT Short Term Goal 2 - Progress (Week 3): Met OT Short Term Goal 3 (Week 3): Pt will complete toilet transfer with MIN A and good adherene to WB precautions OT Short Term Goal 3 - Progress (Week 3): Met OT Short Term Goal 4 (Week 3): Pt will manage pants past hips prior to toileting with MIN A only for balance OT Short Term Goal 4 -  Progress (Week 3): Met Week 4:  OT Short Term Goal 1 (Week 4): STG=LTG d/t ELOS  Skilled Therapeutic Interventions/Progress Updates:     Pt received in bed with no pain reported and agreeable to OT  ADL:  Pt completes bathing with supervision sit to stand with VC for crossing to figure 4 to wash B feet Pt completes UB dressing with S overall for orientation of shirt to self Pt completes LB dressing with supervision sit to stand with VC for hemi dressing strategoes Pt completes footwear with S crossing into figure 4 Pt completes shower/Tub transfer with supervision using RW at ambulatory level    Therapeutic exercise With coban wrapping around LUE, pt requesting to compelte UBE for BUE strengthening and endurance  2 min intervals as follows: BUE forward, RUE only forward, BUE backward, RUE only backwards  Tactile cues to decrease shoulder hike   Therapeutic activity Box and blocks RUE 32 LUE 55  Dynamometer: RUE: 41# LUE 46#  Pt left at end of session in bed with exit alarm on, call light in reach and all needs met   Therapy Documentation Precautions:  Precautions Precautions: Fall, Other (comment) Precaution Comments: R-sided hemiparesis Restrictions Weight Bearing Restrictions: No RLE Weight Bearing: Weight bearing as tolerated General:   Vital Signs: Therapy Vitals Temp: 98.2 F (36.8 C) Pulse Rate: 91 Resp: 16 BP: 109/69 Patient Position (if appropriate): Lying Oxygen Therapy SpO2: 99 % Pain:   ADL: ADL Eating: Maximal assistance Grooming: Maximal assistance Where Assessed-Grooming: Bed level (suction d/t arousal) Upper Body Bathing: Maximal assistance Where Assessed-Upper Body Bathing: Bed level Lower Body Bathing: Dependent  Where Assessed-Lower Body Bathing: Bed level Upper Body Dressing: Maximal assistance Where Assessed-Upper Body Dressing: Bed level Lower Body Dressing: Dependent Where Assessed-Lower Body Dressing: Bed level Toileting:  Unable to assess Toilet Transfer Method: Unable to assess Vision   Perception    Praxis   Exercises:   Other Treatments:     Therapy/Group: Individual Therapy  Christopher Marks 03/09/2020, 6:55 AM

## 2020-03-10 ENCOUNTER — Inpatient Hospital Stay (HOSPITAL_COMMUNITY): Payer: Self-pay

## 2020-03-10 MED ORDER — CHLORDIAZEPOXIDE HCL 5 MG PO CAPS
5.0000 mg | ORAL_CAPSULE | Freq: Every day | ORAL | Status: DC
  Administered 2020-03-11 – 2020-03-12 (×2): 5 mg via ORAL
  Filled 2020-03-10 (×2): qty 1

## 2020-03-10 NOTE — Progress Notes (Signed)
Occupational Therapy Session Note  Patient Details  Name: Christopher Marks MRN: [id number] Date of Birth: Nov 05, 1990  Today's Date: 03/10/2020 OT Individual Time: 8206-0156 OT Individual Time Calculation (min): 27 min    Short Term Goals: Week 1:  OT Short Term Goal 1 (Week 1): Pt will sit EOB wiht MOD A of 1 caregiver in prep for seated toileitng OT Short Term Goal 1 - Progress (Week 1): Met OT Short Term Goal 2 (Week 1): Pt will transfer wiht 1 caregiver and LRAD to w/c in prep for toilet transfer OT Short Term Goal 2 - Progress (Week 1): Met OT Short Term Goal 3 (Week 1): Pt will complete 2/4 steps of UB dressing OT Short Term Goal 3 - Progress (Week 1): Not met OT Short Term Goal 4 (Week 1): Pt will locate 2/3 ADL items R of midline at sink OT Short Term Goal 4 - Progress (Week 1): Met  Skilled Therapeutic Interventions/Progress Updates:    1;1. Pt received in bed agreeable to OT after increased time to arouse. Pt agreeable to going over LB HEP. Pt completes functional mobility with RW to dayroom with VC for decreased shoulder elevation, looking forward and step through pattern. Pt with increased distractions talking to people in hallway hitting R foot into RW. Pt completes seated and standing LB exercises as follows with tactile and VC for improving technique:  Seated LAQ Knee flexion Toe raises Calf raises  Mini squats  Exited session with pt seated in bed, exit alarm on and call light in reach   Therapy Documentation Precautions:  Precautions Precautions: Fall, Other (comment) Precaution Comments: R-sided hemiparesis Restrictions Weight Bearing Restrictions: No RLE Weight Bearing: Weight bearing as tolerated General:   Vital Signs: Therapy Vitals Temp: 98.1 F (36.7 C) Temp Source: Oral Pulse Rate: 95 Resp: 20 BP: 112/69 Patient Position (if appropriate): Lying Oxygen Therapy SpO2: 99 % O2 Device: Room Air Pain: Pain Assessment Pain Scale: 0-10 Pain  Score: 8  Pain Type: Acute pain Pain Location: Neck Pain Orientation: Right;Left Pain Intervention(s): Medication (See eMAR) ADL: ADL Eating: Maximal assistance Grooming: Maximal assistance Where Assessed-Grooming: Bed level (suction d/t arousal) Upper Body Bathing: Maximal assistance Where Assessed-Upper Body Bathing: Bed level Lower Body Bathing: Dependent Where Assessed-Lower Body Bathing: Bed level Upper Body Dressing: Maximal assistance Where Assessed-Upper Body Dressing: Bed level Lower Body Dressing: Dependent Where Assessed-Lower Body Dressing: Bed level Toileting: Unable to assess Toilet Transfer Method: Unable to assess Vision   Perception    Praxis   Exercises:   Other Treatments:     Therapy/Group: Individual Therapy  Tonny Branch 03/10/2020, 6:44 AM
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
## 2020-03-10 NOTE — Progress Notes (Signed)
Occupational Therapy Session Note  Patient Details  Name: Christopher Marks MRN: [id number] Date of Birth: Mar 08, 1991  Today's Date: 03/10/2020 OT Individual Time: 1400-1515 OT Individual Time Calculation (min): 75 min    Short Term Goals: Week 1:  OT Short Term Goal 1 (Week 1): Pt will sit EOB wiht MOD A of 1 caregiver in prep for seated toileitng OT Short Term Goal 1 - Progress (Week 1): Met OT Short Term Goal 2 (Week 1): Pt will transfer wiht 1 caregiver and LRAD to w/c in prep for toilet transfer OT Short Term Goal 2 - Progress (Week 1): Met OT Short Term Goal 3 (Week 1): Pt will complete 2/4 steps of UB dressing OT Short Term Goal 3 - Progress (Week 1): Not met OT Short Term Goal 4 (Week 1): Pt will locate 2/3 ADL items R of midline at sink OT Short Term Goal 4 - Progress (Week 1): Met  Skilled Therapeutic Interventions/Progress Updates:    1;1. Pt received in bed agreeable to OT after increased time to arouse. Pt blaming muscle relaxers for tiredness. Pt completes sit to stand with CGA with RW to turn and transfer to w/c. Pt grooms at sink with setup seated to conserve energy and allow pt to arouse fully. In tx gym pt completes UB and core therex circuit for global strenghtening: bach ball voley with 3# dowel,  Sits ups to foam wedge and ball toss (overhead, bounce and chest pass). Pt ambulates in atrium to complete booth and high top transfers in restaurant seating to simulate community reentry. Pt stands to urinate with 1 LOB requiring MIN A to recover from backwards LOB and OT edu re not standing to pee. Exited session with pt seated in bed, exit alarm on and call light in reach   Therapy Documentation Precautions:  Precautions Precautions: Fall, Other (comment) Precaution Comments: R-sided hemiparesis Restrictions Weight Bearing Restrictions: No RLE Weight Bearing: Weight bearing as tolerated General:   Vital Signs:   Pain:   ADL: ADL Eating: Maximal  assistance Grooming: Maximal assistance Where Assessed-Grooming: Bed level (suction d/t arousal) Upper Body Bathing: Maximal assistance Where Assessed-Upper Body Bathing: Bed level Lower Body Bathing: Dependent Where Assessed-Lower Body Bathing: Bed level Upper Body Dressing: Maximal assistance Where Assessed-Upper Body Dressing: Bed level Lower Body Dressing: Dependent Where Assessed-Lower Body Dressing: Bed level Toileting: Unable to assess Toilet Transfer Method: Unable to assess Vision   Perception    Praxis   Exercises:   Other Treatments:     Therapy/Group: Individual Therapy  Tonny Branch 03/10/2020, 2:11 PM

## 2020-03-10 NOTE — Progress Notes (Signed)
Pt again set off bed alarm by attempting to get up independently.Pt instructed on importance of calling. Pt also stating he has been transferring to bathroom with rolling walker, but safety plan states wheelchair to bathroom. Pt agreeable to wheelchair transfer.

## 2020-03-11 ENCOUNTER — Inpatient Hospital Stay (HOSPITAL_COMMUNITY): Payer: Self-pay

## 2020-03-11 DIAGNOSIS — I10 Essential (primary) hypertension: Secondary | ICD-10-CM

## 2020-03-11 NOTE — Progress Notes (Signed)
Physical Therapy Session Note  Patient Details  Name: Christopher Marks MRN: [id number] Date of Birth: Dec 27, 1990  Today's Date: 03/11/2020 PT Individual Time: 1015-1055 PT Individual Time Calculation (min): 40 min   Short Term Goals: Week 4:  PT Short Term Goal 1 (Week 4): STG=LTG due to ELOS  Skilled Therapeutic Interventions/Progress Updates:     Patient in bed upon PT arrival. Patient alert and agreeable to PT session. Patient reported 3-5/10 R upper quadrant pain with mild distension and tenderness upon palpation during session, RN made aware and reporting to MD. PT provided repositioning, rest breaks, and distraction as pain interventions throughout session.   Therapeutic Activity: Bed Mobility: Patient performed rolling R/L and supine to/from sit with mod I for increased time in a flat bed without use of bed rails.   Focused session on d/c assessment, see d/c note, and discussed patient's progress with functional mobility, R leg strength and knee ROM, awareness, attention, and activity tolerance. Patient participated in discussion with appropriate questions and statements about his progress. Reviewed PT d/c education (24/7 supervision, stair sequencing, fall prevention, activation of emergency response in the even of a fall, keeping a cell phone in his pocket for access to a call for assistance, TBI symptoms and management) from handout in patient's room using teach-back method and emphasized key points about HEP and progression with ankle weights as tolerated. Patient agreeable to all education and d/c instructions. Patient declined mobility this session due to abdominal pain.   Patient in bed at end of session with breaks locked, bed alarm set, and all needs within reach.    Therapy Documentation Precautions:  Precautions Precautions: Fall, Other (comment) Precaution Comments: R-sided hemiparesis Restrictions Weight Bearing Restrictions: Yes RLE Weight Bearing: Weight bearing  as tolerated   Therapy/Group: Individual Therapy  Shervin Cypert L Drake Wuertz PT, DPT  03/11/2020, 1:01 PM
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
## 2020-03-11 NOTE — Progress Notes (Signed)
Occupational Therapy Discharge Summary  Patient Details  Name: Christopher Marks MRN: [id number] Date of Birth: September 23, 1990  Today's Date: 03/11/2020 OT Individual Time: 1250-1335 OT Individual Time Calculation (min): 45 min    Patient has met 13 of 13 long term goals due to improved activity tolerance, improved balance, postural control, ability to compensate for deficits, functional use of  RIGHT upper and RIGHT lower extremity, improved attention, improved awareness and improved coordination.  Patient to discharge at overall Supervision level.  Patient's care partner is independent to provide the necessary physical and cognitive assistance at discharge.  Family education has been completed.   Reasons goals not met: All treatment goals met.   Recommendation:  Patient will benefit from ongoing skilled OT services in outpatient setting to continue to advance functional skills in the area of BADL and iADL.  Equipment: BSC and TTB  Reasons for discharge: treatment goals met and discharge from hospital  Patient/family agrees with progress made and goals achieved: Yes   Skilled OT Intervention: Pt received supine with c/o pain as described below. Pt completed bed mobility to EOB with supervision. D/c assessment completed of pt's vision, RUE/LE. Discussed d/c planning and equipment. Medbridge HEP created for pt d/t no follow up therapy. Reviewed each exercise with pt and he demonstrated understanding/proper technique. Pt completed toileting tasks with close supervision, ambulating into the bathroom with supervision with RW. Pt was left supine with all needs met, bed alarm set.   Access Code: [email or web address]: [email or web address]: 11/28/2021Prepared by: Katharine Look DavisExercises  Wall Push Up - 1 x daily - 7 x weekly - 3 sets - 10 reps  Putty Squeezes - 1 x daily - 7 x weekly - 3 sets - 10 reps  Seated Wrist Flexion with Dumbbell - 1 x daily - 7 x weekly - 3 sets - 10 reps  Standing  Bicep Curls Supinated with Dumbbells - 1 x daily - 7 x weekly - 3 sets - 10 reps  Seated Shoulder Flexion Towel Slide at Table Top - 1 x daily - 7 x weekly - 3 sets - 10 reps  Standing Shoulder Horizontal Abduction with Resistance - 1 x daily - 7 x weekly - 3 sets - 10 reps  Gentle Levator Scapulae Stretch - 1 x daily - 7 x weekly - 3 sets - 10 reps  Seated Upper Trapezius Stretch - 1 x daily - 7 x weekly - 3 sets - 10 reps  Seated Cervical Rotation AROM - 1 x daily - 7 x weekly - 3 sets - 10 reps  Sternocleidomastoid Stretch - 1 x daily - 7 x weekly - 3 sets - 10 reps   OT Discharge Precautions/Restrictions  Precautions Precautions: Fall;Other (comment) Precaution Comments: R-sided hemiparesis Restrictions Weight Bearing Restrictions: Yes RLE Weight Bearing: Weight bearing as tolerated Pain Pain Assessment Pain Scale: 0-10 Pain Score: 3  Pain Type: Acute pain Pain Location: Abdomen Pain Orientation: Mid Pain Descriptors / Indicators: Aching Pain Onset: On-going Pain Intervention(s): Emotional support;Rest ADL ADL Eating: Modified independent Grooming: Modified independent Where Assessed-Grooming: Sitting at sink Upper Body Bathing: Supervision/safety Where Assessed-Upper Body Bathing: Sitting at sink Lower Body Bathing: Supervision/safety Where Assessed-Lower Body Bathing: Shower Upper Body Dressing: Supervision/safety Where Assessed-Upper Body Dressing: Standing at sink, Sitting at sink Lower Body Dressing: Supervision/safety Where Assessed-Lower Body Dressing: Standing at sink, Sitting at sink Toileting: Supervision/safety Where Assessed-Toileting: Glass blower/designer: Close supervision Toilet Transfer Method: Ambulating Tub/Shower Transfer: Close supervison Tub/Shower Transfer Method: Optometrist: Radio broadcast assistant  Vision Baseline Vision/History: No visual deficits Patient Visual Report: No change from baseline Vision Assessment?: Yes Eye  Alignment: Within Functional Limits Ocular Range of Motion: Within Functional Limits Alignment/Gaze Preference: Within Defined Limits Tracking/Visual Pursuits: Able to track stimulus in all quads without difficulty Saccades: Within functional limits Convergence: Within functional limits Visual Fields: No apparent deficits Perception  Perception: Impaired Inattention/Neglect: Does not attend to right visual field;Does not attend to right side of body (much improved) Praxis Praxis: Impaired Praxis Impairment Details: Motor planning Cognition Overall Cognitive Status: Impaired/Different from baseline Arousal/Alertness: Awake/alert Orientation Level: Oriented X4 Attention: Sustained Sustained Attention: Impaired Sustained Attention Impairment: Functional complex Memory: Impaired Memory Impairment: Decreased short term memory Decreased Short Term Memory: Functional complex Awareness: Impaired Awareness Impairment: Anticipatory impairment Problem Solving: Impaired Problem Solving Impairment: Functional complex Executive Function: Decision Making;Self Correcting Decision Making: Impaired Decision Making Impairment: Functional complex Self Correcting: Impaired Self Correcting Impairment: Functional complex Behaviors: Poor frustration tolerance Safety/Judgment: Impaired Comments: Impaired safety awareness, much improved [employer] Scales of Cognitive Functioning: Purposeful/appropriate Sensation Sensation Light Touch: Appears Intact Proprioception: Appears Intact Coordination Gross Motor Movements are Fluid and Coordinated: No Fine Motor Movements are Fluid and Coordinated: No Coordination and Movement Description: R hemi UE>LE Finger Nose Finger Test: slow and undershoots Heel Shin Test: slow and deliberate Motor  Motor Motor: Hemiplegia Motor - Discharge Observations: R hemiplegia with R inattention, R tibia fx now progressed to WBAT with improved mobility Mobility   Bed Mobility Bed Mobility: Rolling Right;Rolling Left;Supine to Sit;Sit to Supine Rolling Right: Independent Rolling Left: Independent Supine to Sit: Independent Sit to Supine: Independent Transfers Sit to Stand: Supervision/Verbal cueing Stand to Sit: Supervision/Verbal cueing  Trunk/Postural Assessment  Cervical Assessment Cervical Assessment: Exceptions to [facility] (muscular tightening on the R side of the neck, much improved since eval) Thoracic Assessment Thoracic Assessment: Exceptions to [facility] (rounded shoulders) Lumbar Assessment Lumbar Assessment: Exceptions to [facility] (posterior pelvic) Postural Control Postural Control: Deficits on evaluation (decreased/delayed)  Balance Balance Balance Assessed: Yes Static Sitting Balance Static Sitting - Balance Support: No upper extremity supported;Feet unsupported Static Sitting - Level of Assistance: 7: Independent Dynamic Sitting Balance Dynamic Sitting - Balance Support: No upper extremity supported;Feet supported;During functional activity Dynamic Sitting - Level of Assistance: 5: Stand by assistance Static Standing Balance Static Standing - Balance Support: No upper extremity supported;During functional activity Static Standing - Level of Assistance: 5: Stand by assistance Dynamic Standing Balance Dynamic Standing - Balance Support: Right upper extremity supported;Left upper extremity supported;During functional activity Dynamic Standing - Level of Assistance: 5: Stand by assistance Extremity/Trunk Assessment RUE Assessment RUE Assessment: Exceptions to [facility] Strength Comments: Shoulder flexion to 90 degrees, abduction to 85 degrees. Elbow flex and ext 3+/5. RUE Body System: Neuro Brunstrum levels for arm and hand: Arm;Hand Brunstrum level for arm: Stage V Relative Independence from Synergy Brunstrum level for hand: Stage VI Isolated joint movements LUE Assessment LUE Assessment: Within Functional Limits   Curtis Sites 03/11/2020, 1:11 PM

## 2020-03-11 NOTE — Progress Notes (Signed)
Speech Language Pathology Daily Session Note  Patient Details  Name: Christopher Marks MRN: [id number] Date of Birth: [birth year]  Today's Date: 03/11/2020 SLP Individual Time: 1102-1130 SLP Individual Time Calculation (min): 28 min  Short Term Goals: Week 4: SLP Short Term Goal 1 (Week 4): STGs=LTGs due to ELOS  Skilled Therapeutic Interventions: Skilled treatment session focused on cognitive goals and discharge planning. SLP facilitated session by providing supervision level verbal cues for complex problem solving task related to discharge home tomorrow (I.e. med management, money management, disability program, fixing iPhone, etc.). Pt benefits from cues to recall current physical and cognitive impairments and how these will impact function at home. Pt reports ongoing deficits with recall, stating he gets stuck on "numbers". Re-educated on compensatory strategies to increase recall and reduce frustration. Pt left supine in bed with alarm on and all needs within reach.   Pain Pain Assessment Pain Scale: 0-10 Pain Score: 0-No pain Pain Intervention(s): Medication (See eMAR)  Therapy/Group: Individual Therapy  Tacey Ruiz 03/11/2020, 11:23 AM

## 2020-03-11 NOTE — Progress Notes (Signed)
Physical Therapy Discharge Summary  Patient Details  Name: Christopher Marks MRN: [id number] Date of Birth: [birth year]  Today's Date: 03/11/2020   Patient has met 12 of 12 long term goals due to improved activity tolerance, improved balance, improved postural control, increased strength, increased range of motion, decreased pain, ability to compensate for deficits, improved attention and improved awareness.  Patient to discharge at an ambulatory level for household mobility and wheelchair level for community mobility [place].   Patient's care partner is independent to provide the necessary physical and cognitive assistance at discharge.  Reasons goals not met: n/a  Recommendation:  Patient will benefit from ongoing skilled PT services in home health setting to continue to advance safe functional mobility, address ongoing impairments in balance, strength, functional mobility, gait, safety awareness, attention, R side attention, activity tolerance, patient/caregiver education, and minimize fall risk.  Equipment: Family to provide w/c and RW at d/c  Reasons for discharge: treatment goals met  Patient/family agrees with progress made and goals achieved: Yes  PT Discharge Precautions/Restrictions Precautions Precautions: Fall;Other (comment) Precaution Comments: R-sided hemiparesis Restrictions Weight Bearing Restrictions: Yes RLE Weight Bearing: Weight bearing as tolerated Vision/Perception  Vision - Assessment Eye Alignment: Within Functional Limits Alignment/Gaze Preference: Within Defined Limits Tracking/Visual Pursuits: Able to track stimulus in all quads without difficulty Saccades: Within functional limits Convergence: Within functional limits Perception Inattention/Neglect: Does not attend to right visual field;Does not attend to right side of body (much improved with mild deficits compared to admission) Praxis Praxis: Impaired Praxis Impairment Details: Motor  planning  Cognition Overall Cognitive Status: Impaired/Different from baseline Arousal/Alertness: Awake/alert Orientation Level: Oriented X4 Attention: Selective Focused Attention: Appears intact Sustained Attention: Appears intact Selective Attention: Impaired Selective Attention Impairment: Functional complex Memory: Impaired Memory Impairment: Decreased short term memory Decreased Short Term Memory: Functional complex Awareness Impairment: Anticipatory impairment Problem Solving: Impaired Problem Solving Impairment: Functional complex Safety/Judgment: Impaired [employer] Scales of Cognitive Functioning: Purposeful/appropriate Sensation Sensation Light Touch: Appears Intact Proprioception: Appears Intact Coordination Gross Motor Movements are Fluid and Coordinated: No Fine Motor Movements are Fluid and Coordinated: No Coordination and Movement Description: R hemi UE>LE Finger Nose Finger Test: slow and undershoots Heel Shin Test: slow and deliberate Motor  Motor Motor: Hemiplegia Motor - Discharge Observations: R hemiplegia with R inattention, R tibia fx now progressed to WBAT with improved mobility  Mobility Bed Mobility Bed Mobility: Rolling Right;Rolling Left;Supine to Sit;Sit to Supine Rolling Right: Independent Rolling Left: Independent Supine to Sit: Independent Sit to Supine: Independent Transfers Transfers: Sit to Stand;Stand to Sit;Stand Pivot Transfers Sit to Stand: Supervision/Verbal cueing Stand to Sit: Supervision/Verbal cueing Stand Pivot Transfers: Supervision/Verbal cueing Stand Pivot Transfer Details: Verbal cues for precautions/safety;Verbal cues for safe use of DME/AE Transfer (Assistive device): Rolling walker Locomotion  Gait Ambulation: Yes Gait Assistance: Contact Guard/Touching assist Assistive device: Rolling walker Gait Gait: Yes Gait Pattern: Step-through pattern;Decreased step length - left;Decreased stance time -  right;Decreased stride length;Decreased hip/knee flexion - right;Decreased dorsiflexion - right;Decreased weight shift to right;Right flexed knee in stance;Decreased trunk rotation;Trunk flexed Stairs / Additional Locomotion Stairs: Yes Stairs Assistance: Minimal Assistance - Patient > 75% Stair Management Technique: One rail Left (B upper extremity support) Height of Stairs: 6 Wheelchair Mobility Wheelchair Mobility: Yes Wheelchair Assistance: Chartered loss adjuster: Both upper extremities Wheelchair Parts Management: Supervision/cueing  Trunk/Postural Assessment  Cervical Assessment Cervical Assessment: Exceptions to [facility] (forward head with decreased ROM and muscular tightening on R (improved since admission)) Thoracic Assessment Thoracic Assessment: Exceptions to [facility] (  rounded shoulders) Lumbar Assessment Lumbar Assessment: Exceptions to [facility] (posterior pelvic) Postural Control Postural Control: Deficits on evaluation (decreased/delayed)  Balance Balance Balance Assessed: Yes Static Sitting Balance Static Sitting - Balance Support: No upper extremity supported;Feet unsupported Static Sitting - Level of Assistance: 7: Independent Dynamic Sitting Balance Dynamic Sitting - Balance Support: No upper extremity supported;Feet supported;During functional activity Dynamic Sitting - Level of Assistance: 5: Stand by assistance Static Standing Balance Static Standing - Balance Support: No upper extremity supported;During functional activity Static Standing - Level of Assistance: 5: Stand by assistance Dynamic Standing Balance Dynamic Standing - Balance Support: Right upper extremity supported;Left upper extremity supported;During functional activity Dynamic Standing - Level of Assistance: 5: Stand by assistance Extremity Assessment  RLE Assessment RLE Assessment: Exceptions to [facility] Active Range of Motion (AROM) Comments: R knee flexion 138 deg, extension lacking 1  deg (assessed in supine), DF 8 deg (in sitting) General Strength Comments: Grossly in sitting: 5/5 throughout, except 4+/5 knee extension/flexion and DF LLE Assessment LLE Assessment: Within Functional Limits Active Range of Motion (AROM) Comments: L knee flexion 141 deg, extension 0 deg (assessed in supine), DF 10 deg (in sitting) General Strength Comments: Grossly 5/5 throughout in sitting    Amrom Ore L Chalonda Schlatter PT, DPT  03/11/2020, 4:11 PM
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
## 2020-03-12 ENCOUNTER — Other Ambulatory Visit (HOSPITAL_COMMUNITY): Payer: Self-pay | Admitting: Physician Assistant

## 2020-03-12 LAB — CBC
HCT: 33 % — ABNORMAL LOW (ref 39.0–52.0)
Hemoglobin: 10.3 g/dL — ABNORMAL LOW (ref 13.0–17.0)
MCH: 24.7 pg — ABNORMAL LOW (ref 26.0–34.0)
MCHC: 31.2 g/dL (ref 30.0–36.0)
MCV: 79.1 fL — ABNORMAL LOW (ref 80.0–100.0)
Platelets: 468 10*3/uL — ABNORMAL HIGH (ref 150–400)
RBC: 4.17 MIL/uL — ABNORMAL LOW (ref 4.22–5.81)
RDW: 16.2 % — ABNORMAL HIGH (ref 11.5–15.5)
WBC: 9.1 10*3/uL (ref 4.0–10.5)
nRBC: 0 % (ref 0.0–0.2)

## 2020-03-12 LAB — BASIC METABOLIC PANEL
Anion gap: 12 (ref 5–15)
BUN: 8 mg/dL (ref 6–20)
CO2: 19 mmol/L — ABNORMAL LOW (ref 22–32)
Calcium: 9.5 mg/dL (ref 8.9–10.3)
Chloride: 105 mmol/L (ref 98–111)
Creatinine, Ser: 0.78 mg/dL (ref 0.61–1.24)
GFR, Estimated: >60 mL/min (ref >60)
Glucose, Bld: 88 mg/dL (ref 70–99)
Potassium: 3.7 mmol/L (ref 3.5–5.1)
Sodium: 136 mmol/L (ref 135–145)

## 2020-03-12 MED ORDER — VITAMIN D3 25 MCG PO TABS: 2000.0000 [IU] | ORAL_TABLET | Freq: Two times a day (BID) | ORAL | 0 refills | Status: DC

## 2020-03-12 MED ORDER — TOPIRAMATE 25 MG PO TABS: 25.0000 mg | ORAL_TABLET | Freq: Every day | ORAL | 0 refills | Status: DC

## 2020-03-12 MED ORDER — APIXABAN 5 MG PO TABS: 5.0000 mg | ORAL_TABLET | Freq: Two times a day (BID) | ORAL | 0 refills | Status: DC

## 2020-03-12 MED ORDER — ACETAMINOPHEN 325 MG PO TABS: 650.0000 mg | ORAL_TABLET | ORAL | Status: DC | PRN

## 2020-03-12 MED ORDER — METOPROLOL TARTRATE 100 MG PO TABS: 100.0000 mg | ORAL_TABLET | Freq: Two times a day (BID) | ORAL | 0 refills | Status: DC

## 2020-03-12 MED ORDER — VITAMIN D (ERGOCALCIFEROL) 1.25 MG (50000 UNIT) PO CAPS: 50000.0000 [IU] | ORAL_CAPSULE | ORAL | 0 refills | Status: DC

## 2020-03-12 MED ORDER — OLANZAPINE 10 MG PO TBDP: 10.0000 mg | ORAL_TABLET | Freq: Every day | ORAL | 0 refills | Status: DC

## 2020-03-12 MED ORDER — OXYCODONE HCL 5 MG PO TABS: 5.0000 mg | ORAL_TABLET | Freq: Three times a day (TID) | ORAL | 0 refills | Status: DC | PRN

## 2020-03-12 MED ORDER — METHOCARBAMOL 500 MG PO TABS: 500.0000 mg | ORAL_TABLET | Freq: Four times a day (QID) | ORAL | 0 refills | Status: DC

## 2020-03-12 MED ORDER — ADULT MULTIVITAMIN W/MINERALS CH: 1.0000 | ORAL_TABLET | Freq: Every day | ORAL | Status: DC

## 2020-03-12 MED ORDER — CHLORDIAZEPOXIDE HCL 5 MG PO CAPS: 5.0000 mg | ORAL_CAPSULE | Freq: Every day | ORAL | 0 refills | Status: DC

## 2020-03-12 MED ORDER — DILTIAZEM HCL 60 MG PO TABS: 60.0000 mg | ORAL_TABLET | Freq: Three times a day (TID) | ORAL | 0 refills | Status: DC

## 2020-03-12 MED ORDER — ASPIRIN 81 MG PO TBEC: 81.0000 mg | DELAYED_RELEASE_TABLET | Freq: Every day | ORAL | 11 refills | Status: DC

## 2020-03-12 MED FILL — TOPIRAMATE 25 MG TABLET: 25 | 30 days supply | Qty: 30 | Fill #0

## 2020-03-12 MED FILL — CHLORDIAZEPOXIDE 5 MG CAP: 5 | 30 days supply | Qty: 30 | Fill #0

## 2020-03-12 MED FILL — METOPROLOL TARTRATE 100 MG: 100 | 30 days supply | Qty: 60 | Fill #0

## 2020-03-12 MED FILL — METHOCARBAMOL 500 MG TABS: 500 | 22 days supply | Qty: 90 | Fill #0

## 2020-03-12 MED FILL — oxyCODONE HCL 5 MG TABS: 5 | 10 days supply | Qty: 30 | Fill #0

## 2020-03-12 MED FILL — VIT D2 1.25 MG (50,000 UNIT: 1.25 MG | 28 days supply | Qty: 5 | Fill #0

## 2020-03-12 MED FILL — dilTIAZem HCL 60 MG TABS: 60 | 30 days supply | Qty: 90 | Fill #0

## 2020-03-12 MED FILL — VITAMIN D3 25 MCG TABS: 25 | 30 days supply | Qty: 60 | Fill #0

## 2020-03-12 MED FILL — ELIQUIS 5 MG TABLET: 5 | 30 days supply | Qty: 60 | Fill #0

## 2020-03-12 MED FILL — OLANZapine 10 MG TABS: 10 | 30 days supply | Qty: 30 | Fill #0

## 2020-03-12 NOTE — Progress Notes (Signed)
Inpatient Rehabilitation Care Coordinator  Discharge Note  The overall goal for the admission was met for:   Discharge location: Yes. D/c to grandmother's home with 24/7 support from various friends/family.   Length of Stay: Yes. 25 days.   Discharge activity level: Yes.a,bulatory for household distances; w/c for community mobility sup-CGA .  Home/community participation: Yes. Limited.   Services provided included: MD, RD, PT, OT, SLP, RN, CM, TR, Pharmacy, Neuropsych and SW  Financial Services: Other: Uninsured  Follow-up services arranged: DME: [employer] for 3in1 BSC and TTB (charity) and Other: home exersice program for PT/OT/SLP due to inability to get charity HH  Comments (or additional information): contact pt grandmother Joann#[phone]  Patient/Family verbalized understanding of follow-up arrangements: Yes  Individual responsible for coordination of the follow-up plan: Pt to have assistance with coordinating care needs.   Confirmed correct DME delivered: Rana Snare 03/12/2020    Loralee Pacas, MSW, [place] Office: [phone] Cell: [phone] Fax: [phone]
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
## 2020-03-12 NOTE — Progress Notes (Signed)
Speech Language Pathology Discharge Summary  Patient Details  Name: Christopher Marks MRN: [id number] Date of Birth: [birth year]   Patient has met 6 of 6 long term goals.  Patient to discharge at overall Supervision level.   Reasons goals not met: N/A  Clinical Impression/Discharge Summary: Patient has made excellent gains and has met 6 of 6 LTGs this admission. Currently, patient is demonstrating behaviors consistent with a Rancho Level VIII and requires overall supervision level verbal cues to complete functional and mildly complex tasks safely in regards to problem solving, attention, recall and awareness. Patient is also consuming regular textures with thin liquids without overt s/s of aspiration and is overall Mod I for use of swallowing compensatory strategies. Patient and family education is complete and patient will discharge home with 24 hour supervision from family. Patient would benefit from f/u home health SLP services to maximize his cognitive functioning and overall functional independence in order to reduce caregiver burden.   Care Partner:  Caregiver Able to Provide Assistance: Yes  Type of Caregiver Assistance: Physical;Cognitive  Recommendation:  Home Health SLP;24 hour supervision/assistance  Rationale for SLP Follow Up: Maximize cognitive function and independence;Reduce caregiver burden   Equipment: N/A   Reasons for discharge: Discharged from hospital;Treatment goals met   Patient/Family Agrees with Progress Made and Goals Achieved: Yes    Arsenia Goracke 03/12/2020, 6:28 AM

## 2020-03-12 NOTE — Progress Notes (Signed)
Patient discharged to home, accompanied by his grandmother.

## 2020-03-14 ENCOUNTER — Telehealth: Payer: Self-pay

## 2020-03-14 NOTE — Telephone Encounter (Signed)
Transitional Care Call--who you spoke with patient Christopher Marks   1. Are you/is patient experiencing any problems since coming home?No  Are there any questions regarding any aspect of care? No.  2. Are there any questions regarding medications administration/dosing?Yes.  Are meds being taken as prescribed? Yes.  Patient should review meds with caller to confirm. Medications confirmed.  3. Have there been any falls? No.  4. Has Home Health been to the house and/or have they contacted you? No.  If not, have you tried to contact them? No insurance. Can we help you contact them? Girlfriend works in [employer].  5. Are bowels and bladder emptying properly? Yes. Are there any unexpected incontinence issues? No problem.  If applicable, is patient following bowel/bladder programs? No problem 6. Any fevers, problems with breathing, unexpected pain? No.  7. Are there any skin problems or new areas of breakdown? None. 8. Has the patient/family member arranged specialty MD follow up (ie cardiology/neurology/renal/surgical/etc)? Yes. Follow up list reviewed and phone contacts given.  Can we help arrange? Family & Girlfriend helping.  9. Does the patient need any other services or support that we can help arrange? No. He will need services for transportion & insurance. 10. Are caregivers following through as expected in assisting the patient? Yes.         11. Has the patient quit smoking, drinking alcohol, or using drugs as recommended? If active patient advised to d/c activities.   Appointment Date/Time/ Arrival time/ and who they are seeing [address]

## 2020-03-21 ENCOUNTER — Other Ambulatory Visit: Payer: Self-pay

## 2020-03-21 ENCOUNTER — Encounter: Payer: Self-pay | Admitting: Registered Nurse

## 2020-03-21 ENCOUNTER — Encounter: Payer: Self-pay | Attending: Registered Nurse | Admitting: Registered Nurse

## 2020-03-21 VITALS — BP 116/80 | HR 92 | Temp 97.2°F | Ht 67.0 in | Wt 181.8 lb

## 2020-03-21 DIAGNOSIS — S82401B Unspecified fracture of shaft of right fibula, initial encounter for open fracture type I or II: Secondary | ICD-10-CM

## 2020-03-21 DIAGNOSIS — S82201D Unspecified fracture of shaft of right tibia, subsequent encounter for closed fracture with routine healing: Secondary | ICD-10-CM | POA: Insufficient documentation

## 2020-03-21 DIAGNOSIS — I639 Cerebral infarction, unspecified: Secondary | ICD-10-CM | POA: Insufficient documentation

## 2020-03-21 DIAGNOSIS — S82201B Unspecified fracture of shaft of right tibia, initial encounter for open fracture type I or II: Secondary | ICD-10-CM

## 2020-03-21 DIAGNOSIS — S82401D Unspecified fracture of shaft of right fibula, subsequent encounter for closed fracture with routine healing: Secondary | ICD-10-CM | POA: Insufficient documentation

## 2020-03-21 DIAGNOSIS — F17210 Nicotine dependence, cigarettes, uncomplicated: Secondary | ICD-10-CM | POA: Insufficient documentation

## 2020-03-21 DIAGNOSIS — T07XXXA Unspecified multiple injuries, initial encounter: Secondary | ICD-10-CM

## 2020-03-21 DIAGNOSIS — F1011 Alcohol abuse, in remission: Secondary | ICD-10-CM | POA: Insufficient documentation

## 2020-03-21 DIAGNOSIS — I10 Essential (primary) hypertension: Secondary | ICD-10-CM | POA: Insufficient documentation
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
## 2020-03-21 NOTE — Patient Instructions (Signed)
Call Dr Jena Gauss to schedule hospital follow up appointment.  [address] [place], [place] [address] [phone]

## 2020-03-28 ENCOUNTER — Telehealth (HOSPITAL_COMMUNITY): Payer: Self-pay | Admitting: Pharmacist

## 2020-03-28 NOTE — Telephone Encounter (Signed)
Pharmacy Transitions of Care Follow-up Telephone Call  Date of discharge: 03/11/20 Discharge Diagnosis: Ischemic CVA of frontal lobe with Acute DVT of left axillary vein left brachial vein per Doppler on 02/06/20  How have you been since you were released from the hospital? Doing well  Medication changes made at discharge:  Apixaban  Medication changes obtained and verified? Y    Medication Accessibility:  Home Pharmacy: [facility]  Was the patient provided with refills on discharged medications? No   Have all prescriptions been transferred from [facility] to home pharmacy? N/A  Is the patient able to afford medications? Unknown . Eligible patient assistance:     Medication Review:  APIXABAN (ELIQUIS)  Apixaban 5mg  BID - Discussed importance of taking medication around the same time everyday  - Reviewed potential DDIs with patient  - Advised patient of medications to avoid (NSAIDs, ASA)  - Educated that Tylenol (acetaminophen) will be the preferred analgesic to prevent risk of bleeding  - Emphasized importance of monitoring for signs and symptoms of bleeding (abnormal bruising, prolonged bleeding, nose bleeds, bleeding from gums, discolored urine, black tarry stools)  - Advised patient to alert all providers of anticoagulation therapy prior to starting a new medication or having a procedure     Follow-up Appointments:  PCP Hospital f/u appt confirmed? Scheduled to see Dr on 05/09/20 @ 0920am.   [facility] f/u appt confirmed? Scheduled to [facility] on 04/26/20 @ 10:30am  If their condition worsens, is the pt aware to call PCP or go to the Emergency Dept.? Y  Final Patient Assessment: Pt is doing well. He verbalized understanding of Eliquis. He has post discharge appointment with Dr. 04/28/20. Per patient's grandma, he has an appointment with his primary MD on 04/04/20 and will ask for refill; a 30 day supply of Eliquis was dispensed on  03/12/20.  03/14/20, PharmD
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
## 2020-04-04 ENCOUNTER — Encounter: Payer: Self-pay | Admitting: Physician Assistant

## 2020-04-04 ENCOUNTER — Other Ambulatory Visit: Payer: Self-pay | Admitting: Physician Assistant

## 2020-04-04 ENCOUNTER — Other Ambulatory Visit: Payer: Self-pay

## 2020-04-04 ENCOUNTER — Ambulatory Visit: Payer: Self-pay | Attending: Physician Assistant | Admitting: Physician Assistant

## 2020-04-04 DIAGNOSIS — S069X9D Unspecified intracranial injury with loss of consciousness of unspecified duration, subsequent encounter: Secondary | ICD-10-CM

## 2020-04-04 DIAGNOSIS — D62 Acute posthemorrhagic anemia: Secondary | ICD-10-CM

## 2020-04-04 DIAGNOSIS — F1011 Alcohol abuse, in remission: Secondary | ICD-10-CM

## 2020-04-04 DIAGNOSIS — R7401 Elevation of levels of liver transaminase levels: Secondary | ICD-10-CM

## 2020-04-04 DIAGNOSIS — Z09 Encounter for follow-up examination after completed treatment for conditions other than malignant neoplasm: Secondary | ICD-10-CM

## 2020-04-04 DIAGNOSIS — I10 Essential (primary) hypertension: Secondary | ICD-10-CM

## 2020-04-04 DIAGNOSIS — D72829 Elevated white blood cell count, unspecified: Secondary | ICD-10-CM

## 2020-04-04 DIAGNOSIS — R519 Headache, unspecified: Secondary | ICD-10-CM

## 2020-04-04 DIAGNOSIS — S36113A Laceration of liver, unspecified degree, initial encounter: Secondary | ICD-10-CM

## 2020-04-04 DIAGNOSIS — T148XXA Other injury of unspecified body region, initial encounter: Secondary | ICD-10-CM

## 2020-04-04 MED ORDER — CHOLECALCIFEROL 50 MCG (2000 UT) PO TABS: 2000.0000 [IU] | ORAL_TABLET | Freq: Every day | ORAL | 2 refills | Status: DC

## 2020-04-04 MED ORDER — DILTIAZEM HCL 60 MG PO TABS: 60.0000 mg | ORAL_TABLET | Freq: Three times a day (TID) | ORAL | 1 refills | Status: DC

## 2020-04-04 MED ORDER — OLANZAPINE 10 MG PO TBDP: 10.0000 mg | ORAL_TABLET | Freq: Every day | ORAL | 0 refills | Status: DC

## 2020-04-04 MED ORDER — TOPIRAMATE 25 MG PO TABS: 25.0000 mg | ORAL_TABLET | Freq: Every day | ORAL | 1 refills | Status: DC

## 2020-04-04 MED ORDER — APIXABAN 5 MG PO TABS: 5.0000 mg | ORAL_TABLET | Freq: Two times a day (BID) | ORAL | 1 refills | Status: DC

## 2020-04-04 MED ORDER — METHOCARBAMOL 500 MG PO TABS: ORAL_TABLET | ORAL | 1 refills | Status: DC

## 2020-04-04 MED ORDER — METOPROLOL TARTRATE 100 MG PO TABS: 100.0000 mg | ORAL_TABLET | Freq: Two times a day (BID) | ORAL | 1 refills | Status: DC

## 2020-04-04 MED FILL — dilTIAZem HCL 60 MG TABS: 60 | 30 days supply | Qty: 90 | Fill #0

## 2020-04-04 MED FILL — OLANZapine 10 MG TABS: 10 | 30 days supply | Qty: 30 | Fill #0

## 2020-04-04 MED FILL — ELIQUIS 5 MG TABLET: 5 | 30 days supply | Qty: 60 | Fill #0

## 2020-04-04 MED FILL — TOPIRAMATE 25 MG TABS: 25 | 30 days supply | Qty: 30 | Fill #0

## 2020-04-04 MED FILL — METOPROLOL TARTRATE 100 MG: 100 | 30 days supply | Qty: 60 | Fill #0

## 2020-04-04 MED FILL — METHOCARBAMOL 500 MG TABS: 500 | 30 days supply | Qty: 180 | Fill #0

## 2020-04-04 NOTE — Patient Instructions (Signed)
[email or web address] is the website for AA/alcoholics anonymous meetings

## 2020-04-05 LAB — COMPREHENSIVE METABOLIC PANEL
ALT: 14 IU/L (ref 0–44)
AST: 13 IU/L (ref 0–40)
Albumin/Globulin Ratio: 1.4 (ref 1.2–2.2)
Albumin: 4.4 g/dL (ref 4.1–5.2)
Alkaline Phosphatase: 141 IU/L — ABNORMAL HIGH (ref 44–121)
BUN/Creatinine Ratio: 13 (ref 9–20)
BUN: 13 mg/dL (ref 6–20)
Bilirubin Total: 0.4 mg/dL (ref 0.0–1.2)
CO2: 20 mmol/L (ref 20–29)
Calcium: 9.8 mg/dL (ref 8.7–10.2)
Chloride: 108 mmol/L — ABNORMAL HIGH (ref 96–106)
Creatinine, Ser: 1.04 mg/dL (ref 0.76–1.27)
GFR calc Af Amer: 113 mL/min/{1.73_m2} (ref >59)
GFR calc non Af Amer: 98 mL/min/{1.73_m2} (ref >59)
Globulin, Total: 3.1 g/dL (ref 1.5–4.5)
Glucose: 92 mg/dL (ref 65–99)
Potassium: 4 mmol/L (ref 3.5–5.2)
Sodium: 142 mmol/L (ref 134–144)
Total Protein: 7.5 g/dL (ref 6.0–8.5)

## 2020-04-05 LAB — CBC WITH DIFFERENTIAL/PLATELET
Basophils Absolute: 0 10*3/uL (ref 0.0–0.2)
Basos: 1 %
EOS (ABSOLUTE): 0.2 10*3/uL (ref 0.0–0.4)
Eos: 2 %
Hematocrit: 38.5 % (ref 37.5–51.0)
Hemoglobin: 12.4 g/dL — ABNORMAL LOW (ref 13.0–17.7)
Immature Grans (Abs): 0 10*3/uL (ref 0.0–0.1)
Immature Granulocytes: 1 %
Lymphocytes Absolute: 2.8 10*3/uL (ref 0.7–3.1)
Lymphs: 38 %
MCH: 24.8 pg — ABNORMAL LOW (ref 26.6–33.0)
MCHC: 32.2 g/dL (ref 31.5–35.7)
MCV: 77 fL — ABNORMAL LOW (ref 79–97)
Monocytes Absolute: 0.9 10*3/uL (ref 0.1–0.9)
Monocytes: 12 %
Neutrophils Absolute: 3.6 10*3/uL (ref 1.4–7.0)
Neutrophils: 46 %
Platelets: 289 10*3/uL (ref 150–450)
RBC: 5.01 x10E6/uL (ref 4.14–5.80)
RDW: 16.5 % — ABNORMAL HIGH (ref 11.6–15.4)
WBC: 7.5 10*3/uL (ref 3.4–10.8)

## 2020-04-26 ENCOUNTER — Ambulatory Visit (INDEPENDENT_AMBULATORY_CARE_PROVIDER_SITE_OTHER): Payer: Self-pay | Admitting: Neurology

## 2020-04-26 ENCOUNTER — Encounter: Payer: Self-pay | Admitting: Neurology

## 2020-04-26 VITALS — BP 153/100 | HR 86 | Ht 67.0 in | Wt 192.6 lb

## 2020-04-26 DIAGNOSIS — I82A13 Acute embolism and thrombosis of axillary vein, bilateral: Secondary | ICD-10-CM

## 2020-04-26 DIAGNOSIS — I6310 Cerebral infarction due to embolism of unspecified precerebral artery: Secondary | ICD-10-CM
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
## 2020-05-09 ENCOUNTER — Encounter: Payer: Self-pay | Attending: Registered Nurse | Admitting: Physical Medicine & Rehabilitation

## 2020-05-09 ENCOUNTER — Encounter: Payer: Self-pay | Admitting: Physical Medicine & Rehabilitation

## 2020-05-09 ENCOUNTER — Other Ambulatory Visit: Payer: Self-pay

## 2020-05-09 VITALS — BP 153/95 | HR 76 | Temp 98.2°F | Ht 67.0 in | Wt 192.2 lb

## 2020-05-09 DIAGNOSIS — I639 Cerebral infarction, unspecified: Secondary | ICD-10-CM | POA: Insufficient documentation

## 2020-05-09 DIAGNOSIS — F329 Major depressive disorder, single episode, unspecified: Secondary | ICD-10-CM | POA: Insufficient documentation

## 2020-05-09 DIAGNOSIS — T07XXXA Unspecified multiple injuries, initial encounter: Secondary | ICD-10-CM | POA: Insufficient documentation

## 2020-05-09 MED ORDER — CITALOPRAM HYDROBROMIDE 10 MG PO TABS: 10.0000 mg | ORAL_TABLET | Freq: Every day | ORAL | 2 refills | Status: DC

## 2020-05-09 NOTE — Patient Instructions (Addendum)
PLEASE FEEL FREE TO CALL OUR OFFICE WITH ANY PROBLEMS OR QUESTIONS [phone])  Heat or ice to wrist.   You can take up to 3000mg  tylenol per day, divided in 4 doses (650-1000mg  at a time)
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
## 2020-05-21 ENCOUNTER — Telehealth: Payer: Self-pay | Admitting: Neurology

## 2020-05-21 NOTE — Telephone Encounter (Signed)
Can You Please Cx  VAS [place] TRANSCRANIAL DOPPLER W BUBBLES  . Per [facility] request . Called to check patient  Patient does not  have insurance yet . Patient 's  mother will call back when he does . Thanks Annabelle Harman

## 2020-05-23 ENCOUNTER — Ambulatory Visit: Payer: Self-pay | Admitting: Family Medicine

## 2020-05-23 NOTE — Addendum Note (Signed)
Addended by: Raliegh Ip on: 05/23/2020 10:12 AM   Modules accepted: Orders

## 2020-05-24 ENCOUNTER — Other Ambulatory Visit: Payer: Self-pay | Admitting: Physician Assistant

## 2020-05-24 MED FILL — dilTIAZem HCL 60 MG TABS: 60 | 30 days supply | Qty: 90 | Fill #1

## 2020-05-24 MED FILL — TOPIRAMATE 25 MG TABS: 25 | 30 days supply | Qty: 30 | Fill #1

## 2020-05-24 MED FILL — !ELIQUIS 5MG TABLET: 5 | 30 days supply | Qty: 60 | Fill #1

## 2020-05-24 MED FILL — METHOCARBAMOL 500 MG TABS: 500 | 30 days supply | Qty: 180 | Fill #1

## 2020-05-24 MED FILL — METOPROLOL TARTRATE 100 MG: 100 | 30 days supply | Qty: 60 | Fill #1
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
## 2020-05-25 ENCOUNTER — Other Ambulatory Visit: Payer: Self-pay | Admitting: Family Medicine

## 2020-05-25 ENCOUNTER — Other Ambulatory Visit: Payer: Self-pay | Admitting: Physician Assistant

## 2020-05-25 DIAGNOSIS — T148XXA Other injury of unspecified body region, initial encounter: Secondary | ICD-10-CM
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
## 2020-06-11 ENCOUNTER — Other Ambulatory Visit: Payer: Self-pay | Admitting: Physician Assistant

## 2020-06-11 DIAGNOSIS — S069X9D Unspecified intracranial injury with loss of consciousness of unspecified duration, subsequent encounter: Secondary | ICD-10-CM

## 2020-07-01 ENCOUNTER — Other Ambulatory Visit: Payer: Self-pay | Admitting: Physical Medicine & Rehabilitation

## 2020-07-01 DIAGNOSIS — F329 Major depressive disorder, single episode, unspecified: Secondary | ICD-10-CM

## 2020-07-14 ENCOUNTER — Other Ambulatory Visit: Payer: Self-pay

## 2020-07-19 ENCOUNTER — Ambulatory Visit: Payer: Self-pay | Admitting: Family Medicine

## 2020-08-02 DIAGNOSIS — Z0271 Encounter for disability determination: Secondary | ICD-10-CM

## 2020-08-08 ENCOUNTER — Other Ambulatory Visit: Payer: Self-pay

## 2020-08-08 ENCOUNTER — Telehealth: Payer: Self-pay | Admitting: *Deleted

## 2020-08-08 ENCOUNTER — Encounter: Payer: Self-pay | Attending: Physical Medicine & Rehabilitation | Admitting: Physical Medicine & Rehabilitation

## 2020-08-08 ENCOUNTER — Other Ambulatory Visit: Payer: Self-pay | Admitting: Physician Assistant

## 2020-08-08 ENCOUNTER — Ambulatory Visit (HOSPITAL_COMMUNITY)
Admission: RE | Admit: 2020-08-08 | Discharge: 2020-08-08 | Disposition: A | Discharge status: Home / Self Care | Payer: Self-pay | Source: Ambulatory Visit | Attending: Physical Medicine & Rehabilitation | Admitting: Physical Medicine & Rehabilitation

## 2020-08-08 ENCOUNTER — Encounter: Payer: Self-pay | Admitting: Physical Medicine & Rehabilitation

## 2020-08-08 VITALS — BP 159/113 | HR 76 | Temp 98.5°F | Ht 67.0 in | Wt 195.0 lb

## 2020-08-08 DIAGNOSIS — I10 Essential (primary) hypertension: Secondary | ICD-10-CM

## 2020-08-08 DIAGNOSIS — I82722 Chronic embolism and thrombosis of deep veins of left upper extremity: Secondary | ICD-10-CM

## 2020-08-08 DIAGNOSIS — I639 Cerebral infarction, unspecified: Secondary | ICD-10-CM | POA: Insufficient documentation

## 2020-08-08 DIAGNOSIS — R519 Headache, unspecified: Secondary | ICD-10-CM

## 2020-08-08 MED ORDER — METOPROLOL TARTRATE 100 MG PO TABS
ORAL_TABLET | Freq: Two times a day (BID) | ORAL | 1 refills | Status: DC
  Filled 2020-08-08: qty 60, 30d supply, fill #0
  Filled 2020-09-24: qty 60, 30d supply, fill #1

## 2020-08-08 MED ORDER — DILTIAZEM HCL 60 MG PO TABS
ORAL_TABLET | ORAL | 0 refills | Status: DC
  Filled 2020-08-08: qty 90, 30d supply, fill #0
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
## 2020-08-08 NOTE — Patient Instructions (Addendum)
PLEASE FEEL FREE TO CALL OUR OFFICE WITH ANY PROBLEMS OR QUESTIONS [phone])   FIND A WAY TO RE-CHECK YOUR BLOOD PRESSURE THIS WEEK  MAKE SURE YOU'RE TAKING ALL THE MEDS FOR BP  IF STILL ELEVATED, YOU NEED TO SEE YOUR PRIMARY MD ABOUT ADJUSTMENT OF REGIMEN.

## 2020-08-08 NOTE — Telephone Encounter (Signed)
Christopher Marks from [facility] called to report that the doppler studies revealed no new acute DVT. Dr Christopher Marks notified. Per Dr Christopher Marks he can stop his Eliquis.  Christopher Marks notified.
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
## 2020-08-09 ENCOUNTER — Other Ambulatory Visit: Payer: Self-pay

## 2020-08-10 ENCOUNTER — Other Ambulatory Visit: Payer: Self-pay

## 2020-08-14 ENCOUNTER — Other Ambulatory Visit: Payer: Self-pay

## 2020-08-27 ENCOUNTER — Other Ambulatory Visit: Payer: Self-pay

## 2020-09-24 ENCOUNTER — Other Ambulatory Visit: Payer: Self-pay | Admitting: Physician Assistant

## 2020-09-24 ENCOUNTER — Other Ambulatory Visit: Payer: Self-pay

## 2020-09-24 DIAGNOSIS — I10 Essential (primary) hypertension: Secondary | ICD-10-CM

## 2020-09-24 MED ORDER — DILTIAZEM HCL 60 MG PO TABS
ORAL_TABLET | ORAL | 0 refills | Status: DC
  Filled 2020-09-24: qty 90, 30d supply, fill #0

## 2020-09-24 MED FILL — Methocarbamol Tab 500 MG: ORAL | 30 days supply | Qty: 180 | Fill #0 | Status: AC

## 2020-09-25 ENCOUNTER — Other Ambulatory Visit: Payer: Self-pay

## 2020-09-27 ENCOUNTER — Ambulatory Visit: Payer: Self-pay | Admitting: Family Medicine

## 2020-10-29 ENCOUNTER — Encounter: Payer: Self-pay | Admitting: Adult Health

## 2020-10-29 ENCOUNTER — Telehealth: Payer: Self-pay | Admitting: *Deleted

## 2020-10-29 ENCOUNTER — Ambulatory Visit: Payer: Self-pay | Admitting: Adult Health

## 2020-10-29 NOTE — Telephone Encounter (Signed)
Patient was no-show for appointment today 
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
## 2020-11-16 ENCOUNTER — Other Ambulatory Visit: Payer: Self-pay

## 2020-12-10 ENCOUNTER — Ambulatory Visit: Payer: Self-pay | Admitting: Family Medicine

## 2021-02-13 ENCOUNTER — Encounter: Payer: Self-pay | Attending: Physical Medicine & Rehabilitation | Admitting: Physical Medicine & Rehabilitation

## 2021-03-27 ENCOUNTER — Ambulatory Visit (HOSPITAL_COMMUNITY): Admission: EM | Admit: 2021-03-27 | Discharge: 2021-03-27 | Payer: Self-pay

## 2021-03-27 ENCOUNTER — Other Ambulatory Visit: Payer: Self-pay

## 2021-03-27 ENCOUNTER — Encounter (HOSPITAL_COMMUNITY): Payer: Self-pay

## 2021-03-27 DIAGNOSIS — R42 Dizziness and giddiness: Secondary | ICD-10-CM

## 2021-03-27 DIAGNOSIS — I10 Essential (primary) hypertension: Secondary | ICD-10-CM

## 2021-03-27 DIAGNOSIS — R519 Headache, unspecified: Secondary | ICD-10-CM

## 2021-03-27 DIAGNOSIS — I160 Hypertensive urgency: Secondary | ICD-10-CM

## 2021-03-27 NOTE — ED Notes (Signed)
Patient is being discharged from the Urgent Care and sent to the Emergency Department via personal vehicle . Per Provider Wallis Bamberg, patient is in need of higher level of care due to hypertensive urgency. Patient is aware and verbalizes understanding of plan of care.  Vitals:   03/27/21 1727  BP: (!) 158/112  Pulse: 82  Resp: 20  Temp: 98.2 F (36.8 C)  SpO2: 100%

## 2021-03-27 NOTE — ED Triage Notes (Signed)
Pt presents with elevated blood pressure  with headache, spotty vision, and dizziness X 1 week.

## 2021-03-27 NOTE — ED Provider Notes (Signed)
[facility]   MRN: [id number] DOB: 10/21/1990  Subjective:   Christopher Marks is a 30 y.o. male with pmh of CVA, TBI, HTN presenting for 1 week history of persistent dizziness, spotty vision, intermittent moderate left sided headaches.  Denies confusion, weakness on one side of his body, chest pain, shortness of breath, nausea, vomiting, abdominal pain.  Patient is not currently taking any medications despite that he is supposed to take them for his blood pressure.  He does not have a PCP.  Patient is not currently taking any medications.    No Known Allergies  Past Medical History:  Diagnosis Date   Hypertension    Stroke (HCC) 01/26/2020     Past Surgical History:  Procedure Laterality Date   TIBIA IM NAIL INSERTION Right 01/26/2020   Procedure: INTRAMEDULLARY (IM) NAIL TIBIAL WITH IRRIGATION AND DEBRIDMENT OF OPEN FRACTURE.;  Surgeon: Roby Lofts, MD;  Location: [facility] OR;  Service: Orthopedics;  Laterality: Right;    History reviewed. No pertinent family history.  Social History   Tobacco Use   Smoking status: Every Day    Packs/day: 0.50    Types: Cigarettes   Smokeless tobacco: Never  Vaping Use   Vaping Use: Never used  Substance Use Topics   Alcohol use: Yes   Drug use: Yes    Types: Marijuana    ROS   Objective:   Vitals: BP (!) 158/112 (BP Location: Left Arm)    Pulse 82    Temp 98.2 F (36.8 C) (Oral)    Resp 20    SpO2 100%   Physical Exam Constitutional:      General: He is not in acute distress.    Appearance: Normal appearance. He is well-developed. He is not ill-appearing, toxic-appearing or diaphoretic.  HENT:     Head: Normocephalic and atraumatic.     Right Ear: External ear normal.     Left Ear: External ear normal.     Nose: Nose normal.     Mouth/Throat:     Mouth: Mucous membranes are moist.     Pharynx: Oropharynx is clear.  Eyes:     General: No scleral icterus.    Extraocular Movements: Extraocular  movements intact.     Pupils: Pupils are equal, round, and reactive to light.  Cardiovascular:     Rate and Rhythm: Normal rate and regular rhythm.     Heart sounds: Normal heart sounds. No murmur heard.   No friction rub. No gallop.  Pulmonary:     Effort: Pulmonary effort is normal. No respiratory distress.     Breath sounds: Normal breath sounds. No stridor. No wheezing, rhonchi or rales.  Neurological:     Mental Status: He is alert and oriented to person, place, and time.     Cranial Nerves: No cranial nerve deficit.     Motor: No weakness.     Coordination: Coordination normal.     Gait: Gait normal.     Deep Tendon Reflexes: Reflexes normal.     Comments: Negative Romberg and pronator drift.  Psychiatric:        Mood and Affect: Mood normal.        Behavior: Behavior normal.        Thought Content: Thought content normal.        Judgment: Judgment normal.    Assessment and Plan :   PDMP not reviewed this encounter.  1. Left-sided headache   2. Essential hypertension  3. Hypertensive urgency   4. Dizziness    Despite a negative Romberg and pronator drift, patient is symptomatic including moderate to severe left-sided headache, dizziness and vision changes over the past week.  Given his history of having had a CVA, TBI recommended further evaluation in the emergency room to rule out an acute encephalopathy, stroke.  Patient refused transport by EMS and I am in agreement, will report there by personal vehicle now.   Wallis Bamberg, [place] 03/27/21 [id number]

## 2021-04-27 ENCOUNTER — Other Ambulatory Visit: Payer: Self-pay

## 2021-04-27 ENCOUNTER — Emergency Department (HOSPITAL_COMMUNITY)
Admission: EM | Admit: 2021-04-27 | Discharge: 2021-04-27 | Disposition: A | Discharge status: Home / Self Care | Payer: Self-pay | Attending: Emergency Medicine | Admitting: Emergency Medicine

## 2021-04-27 ENCOUNTER — Emergency Department (HOSPITAL_COMMUNITY): Payer: Self-pay

## 2021-04-27 ENCOUNTER — Encounter (HOSPITAL_COMMUNITY): Payer: Self-pay | Admitting: Emergency Medicine

## 2021-04-27 DIAGNOSIS — R519 Headache, unspecified: Secondary | ICD-10-CM

## 2021-04-27 DIAGNOSIS — Z7982 Long term (current) use of aspirin: Secondary | ICD-10-CM | POA: Insufficient documentation

## 2021-04-27 DIAGNOSIS — I10 Essential (primary) hypertension: Secondary | ICD-10-CM | POA: Insufficient documentation

## 2021-04-27 DIAGNOSIS — Z20822 Contact with and (suspected) exposure to covid-19: Secondary | ICD-10-CM | POA: Insufficient documentation

## 2021-04-27 DIAGNOSIS — Z79899 Other long term (current) drug therapy: Secondary | ICD-10-CM | POA: Insufficient documentation

## 2021-04-27 LAB — RESP PANEL BY RT-PCR (FLU A&B, COVID) ARPGX2
Influenza A by PCR: NEGATIVE
Influenza B by PCR: NEGATIVE
SARS Coronavirus 2 by RT PCR: NEGATIVE

## 2021-04-27 IMAGING — CT CT HEAD W/O CM
4 series · 15 of 47 positions shown, 17 images · non-contrast
Comparison: CT head [DATE]

CLINICAL DATA: Headache, chronic, new features or increased
frequency History of TBI, stroke



[Series 3: head without · axial · non-contrast · 0.44mm/px · z∈[-165,-45]mm · 7 of 33 slices shown, 9 images]
[im 5/33  brain]
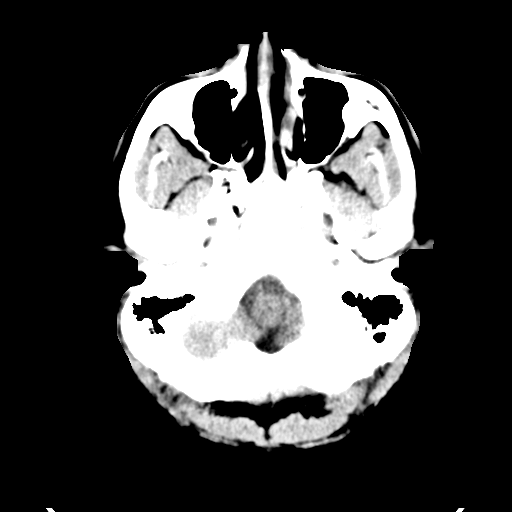
[im 5/33  bone]
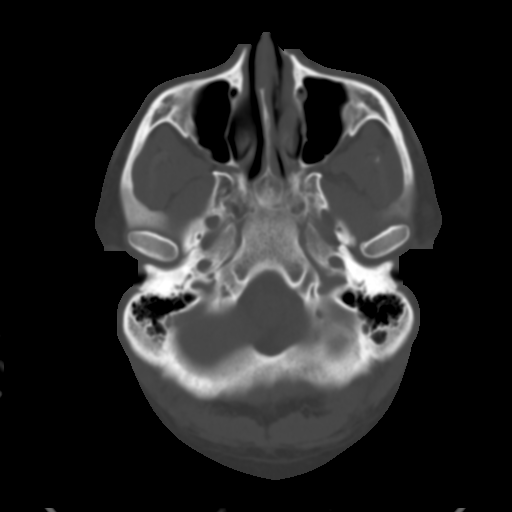
[im 9/33  brain]
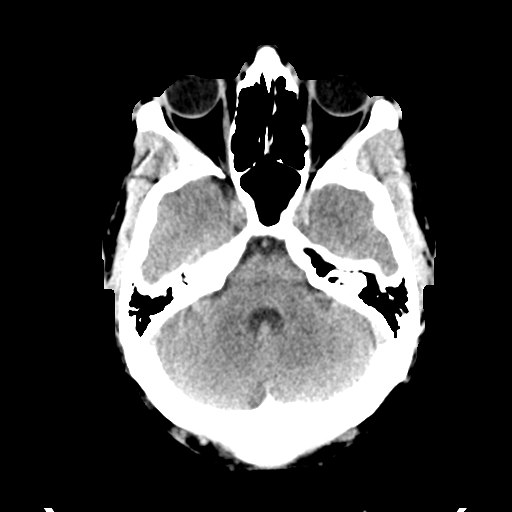
[im 13/33  brain]
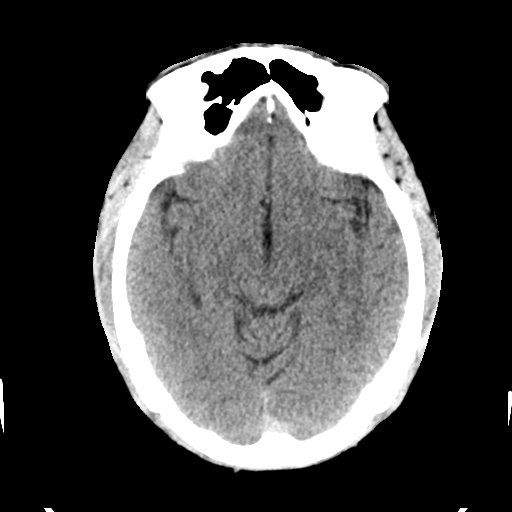
[im 17/33  brain]
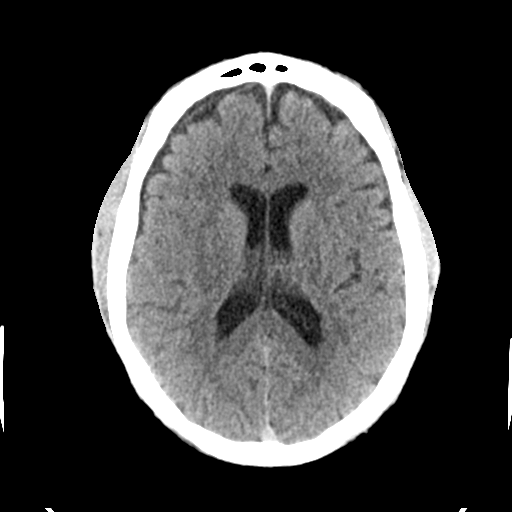
[im 21/33  brain]
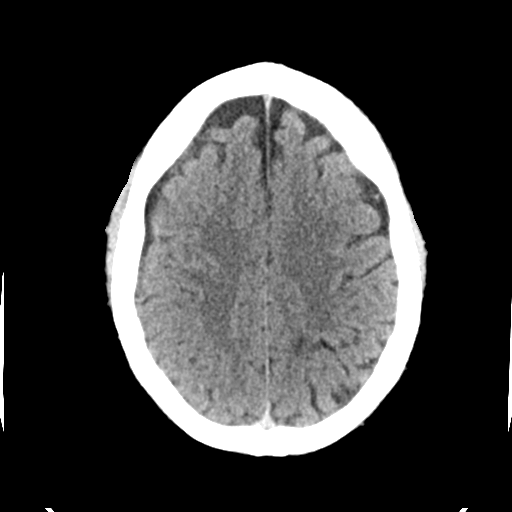
[im 21/33  bone]
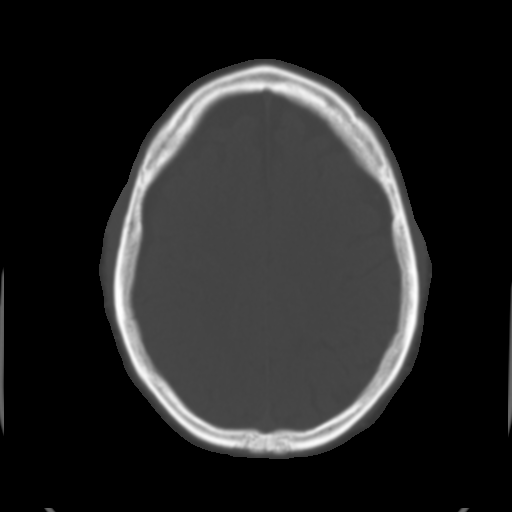
[im 25/33  brain]
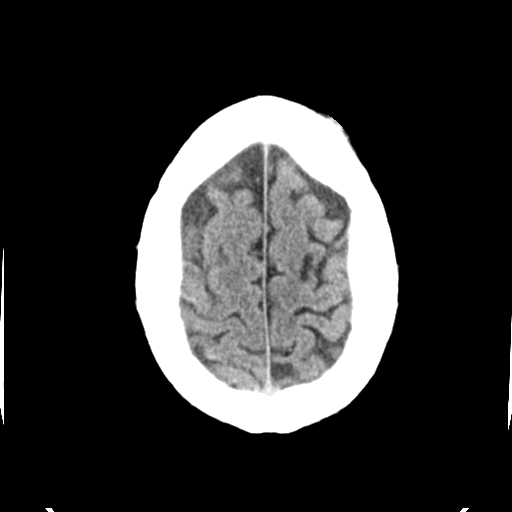
[im 29/33  brain]
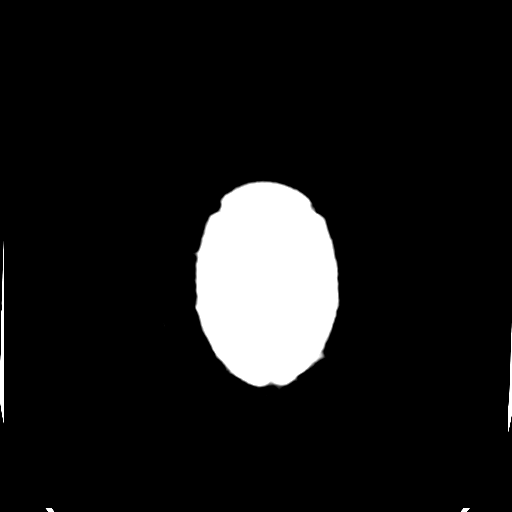

[Series 4: head bone · axial · 0.44mm/px · z∈[-169,-153]mm · 2 of 83 slices shown]
[im 9/83  bone]
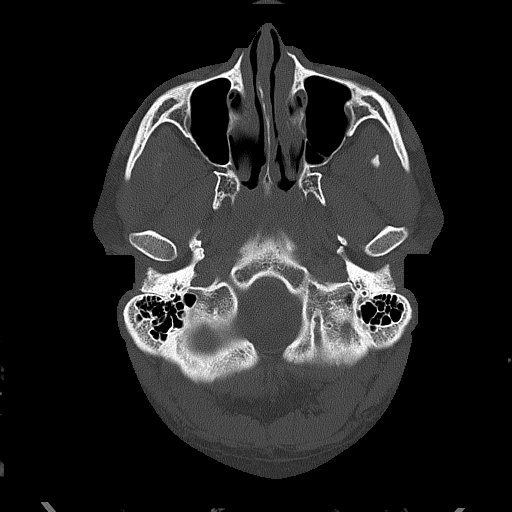
[im 17/83  bone]
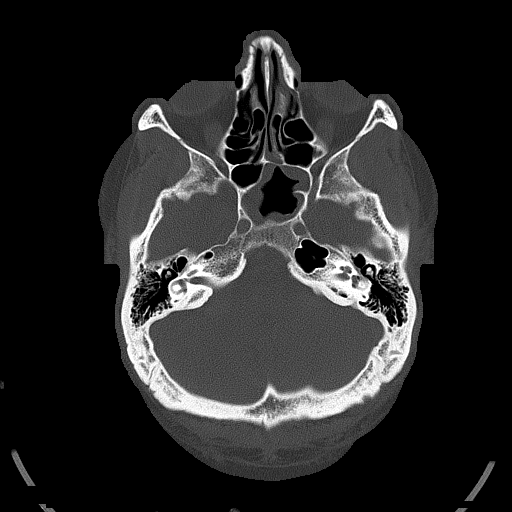

[Series 5: head without cor · coronal · non-contrast · 0.28mm/px · 3 of 73 slices shown]
[im 25/73  brain]
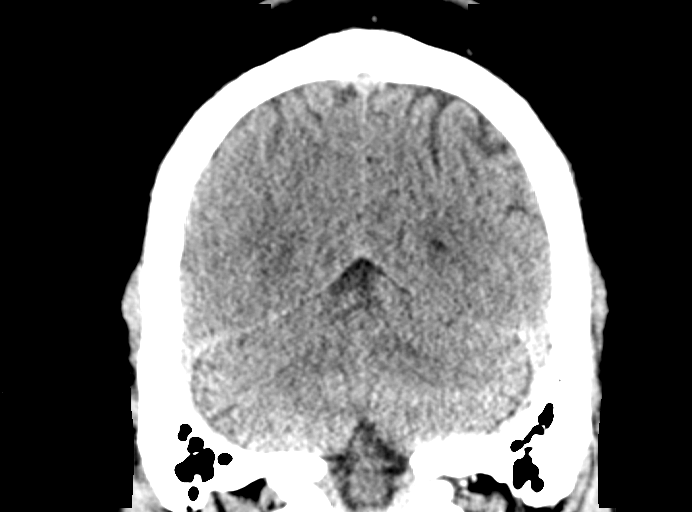
[im 33/73  brain]
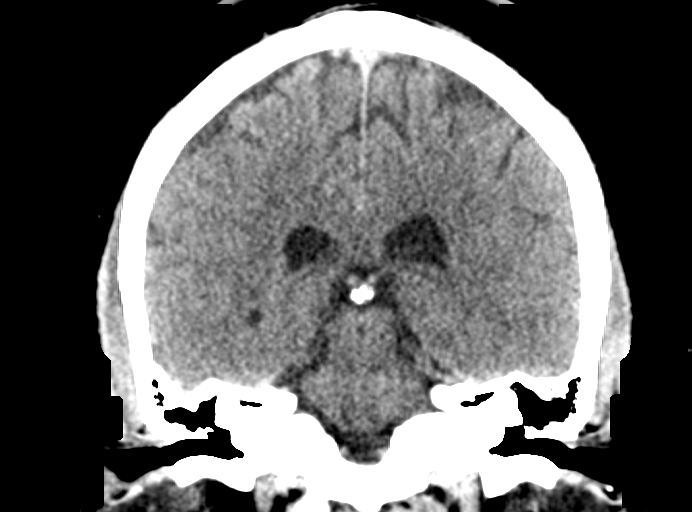
[im 41/73  brain]
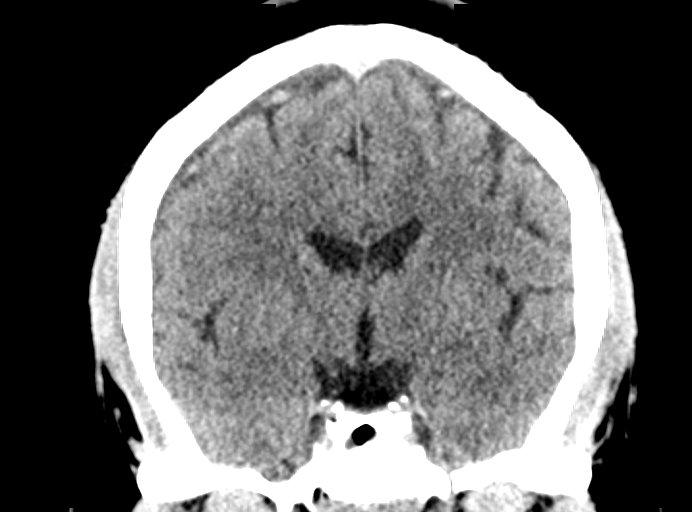

[Series 6: head without sag · sagittal · non-contrast · 0.30mm/px · 3 of 67 slices shown]
[im 23/67  brain]
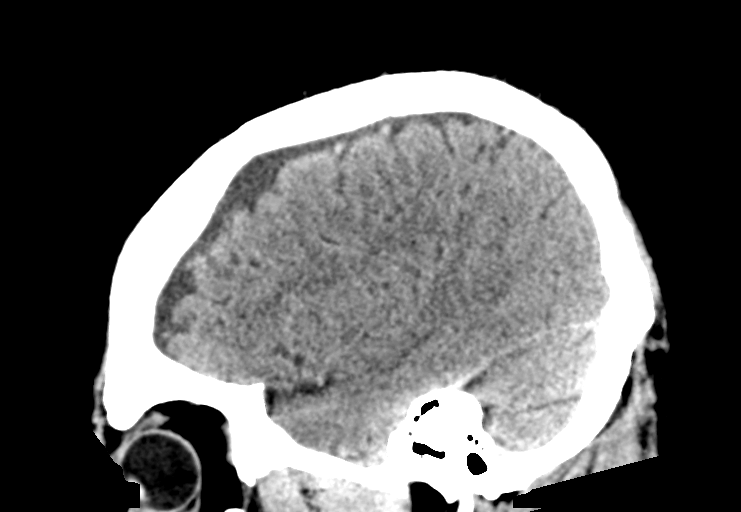
[im 34/67  brain]
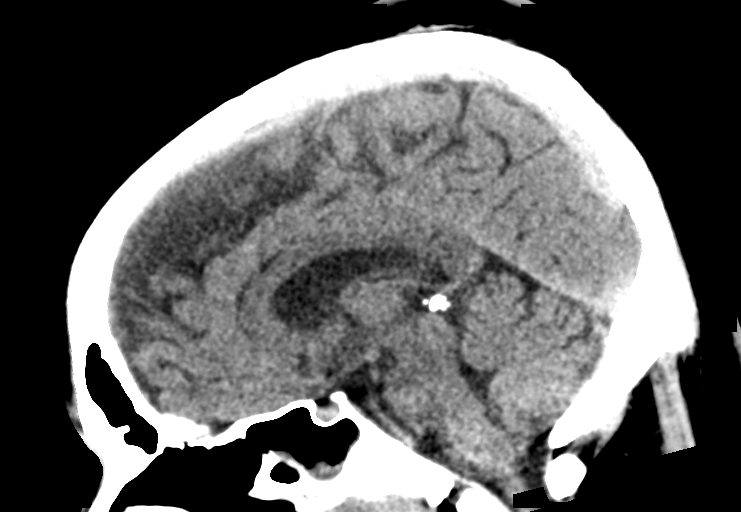
[im 45/67  brain]
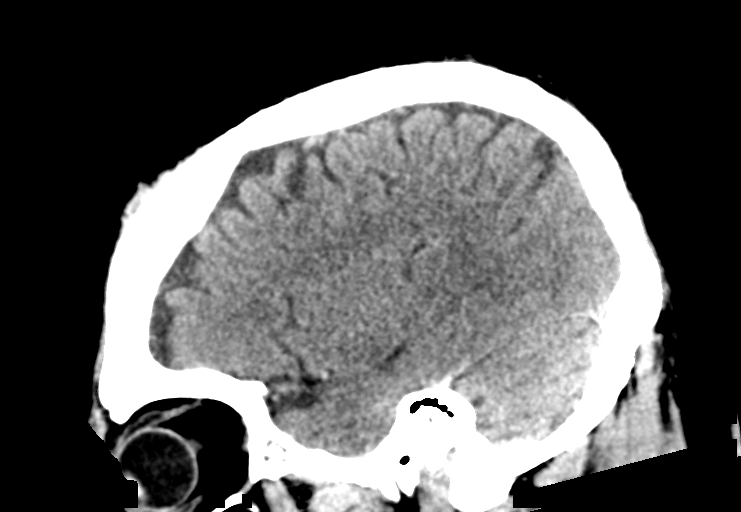

[15 of 47 positions shown; findings below may reference images not displayed]

FINDINGS: Brain:

No evidence of large-territorial acute infarction. No parenchymal
hemorrhage. No mass lesion. No extra-axial collection.

No mass effect or midline shift. No hydrocephalus. Basilar cisterns
are patent.

Vascular: No hyperdense vessel.

Skull: No acute fracture or focal lesion.

Sinuses/Orbits: For left sphenoid sinus mucosal thickening.
Paranasal sinuses and mastoid air cells are clear. The orbits are
unremarkable.

Other: None.
IMPRESSION: No acute intracranial abnormality.

## 2021-04-27 MED ORDER — METOPROLOL TARTRATE 50 MG PO TABS: 50.0000 mg | ORAL_TABLET | Freq: Two times a day (BID) | ORAL | 0 refills | Status: DC

## 2021-04-27 NOTE — Discharge Instructions (Addendum)
Today your CT scan on your head was overall reassuring. Your blood pressures are significantly elevated today.  It is important that you are following up with your doctor and taking your blood pressure medicines. Even if you do not feel the high blood pressure it is still causing damage.  Please follow-up with your primary care doctor.  If you develop any weakness, or have any new or concerning symptoms please seek additional medical care and evaluation.  Follow your results for your flu and COVID test through MyChart.

## 2021-04-27 NOTE — ED Triage Notes (Signed)
Pt c/o headache x 2 days, sensitivity to light and some nausea. Reports hx HTN and stroke. No neuro deficits noted at this time.

## 2021-04-27 NOTE — ED Notes (Signed)
Discharge instructions, follow up care, and prescriptions reviewed and explained, pt verbalized understanding.  
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
## 2021-05-06 ENCOUNTER — Ambulatory Visit (HOSPITAL_BASED_OUTPATIENT_CLINIC_OR_DEPARTMENT_OTHER): Payer: Medicaid Other | Admitting: Family Medicine

## 2021-05-13 ENCOUNTER — Other Ambulatory Visit: Payer: Self-pay

## 2021-05-13 ENCOUNTER — Ambulatory Visit (HOSPITAL_COMMUNITY)
Admission: EM | Admit: 2021-05-13 | Discharge: 2021-05-13 | Disposition: A | Discharge status: Home / Self Care | Payer: Self-pay | Attending: Internal Medicine | Admitting: Internal Medicine

## 2021-05-13 ENCOUNTER — Encounter (HOSPITAL_COMMUNITY): Payer: Self-pay | Admitting: Emergency Medicine

## 2021-05-13 ENCOUNTER — Ambulatory Visit (INDEPENDENT_AMBULATORY_CARE_PROVIDER_SITE_OTHER): Payer: Self-pay

## 2021-05-13 DIAGNOSIS — R059 Cough, unspecified: Secondary | ICD-10-CM

## 2021-05-13 DIAGNOSIS — R058 Other specified cough: Secondary | ICD-10-CM

## 2021-05-13 IMAGING — DX DG CHEST 2V
2 series · 2 of 2 positions shown · non-contrast
Comparison: Chest x-ray [DATE]

CLINICAL DATA: Cough

EXAM:
CHEST - 2 VIEW

[chest pa]
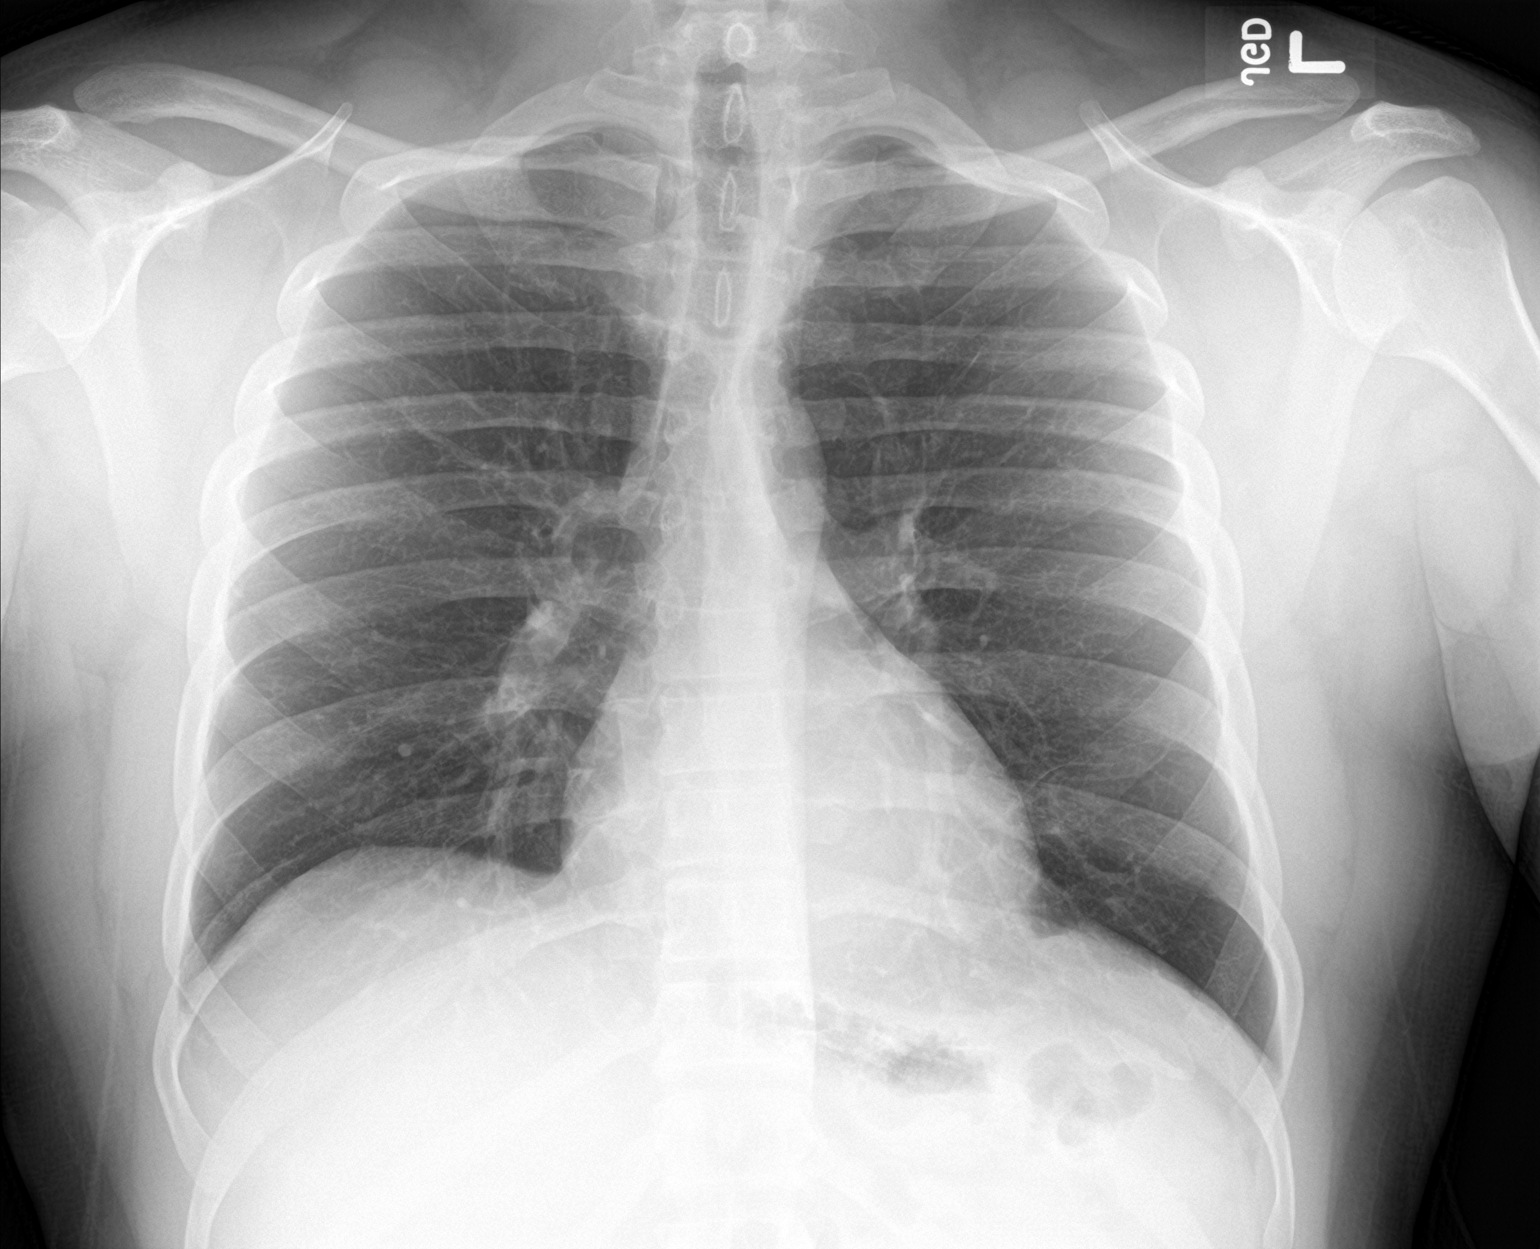

[chest lat]
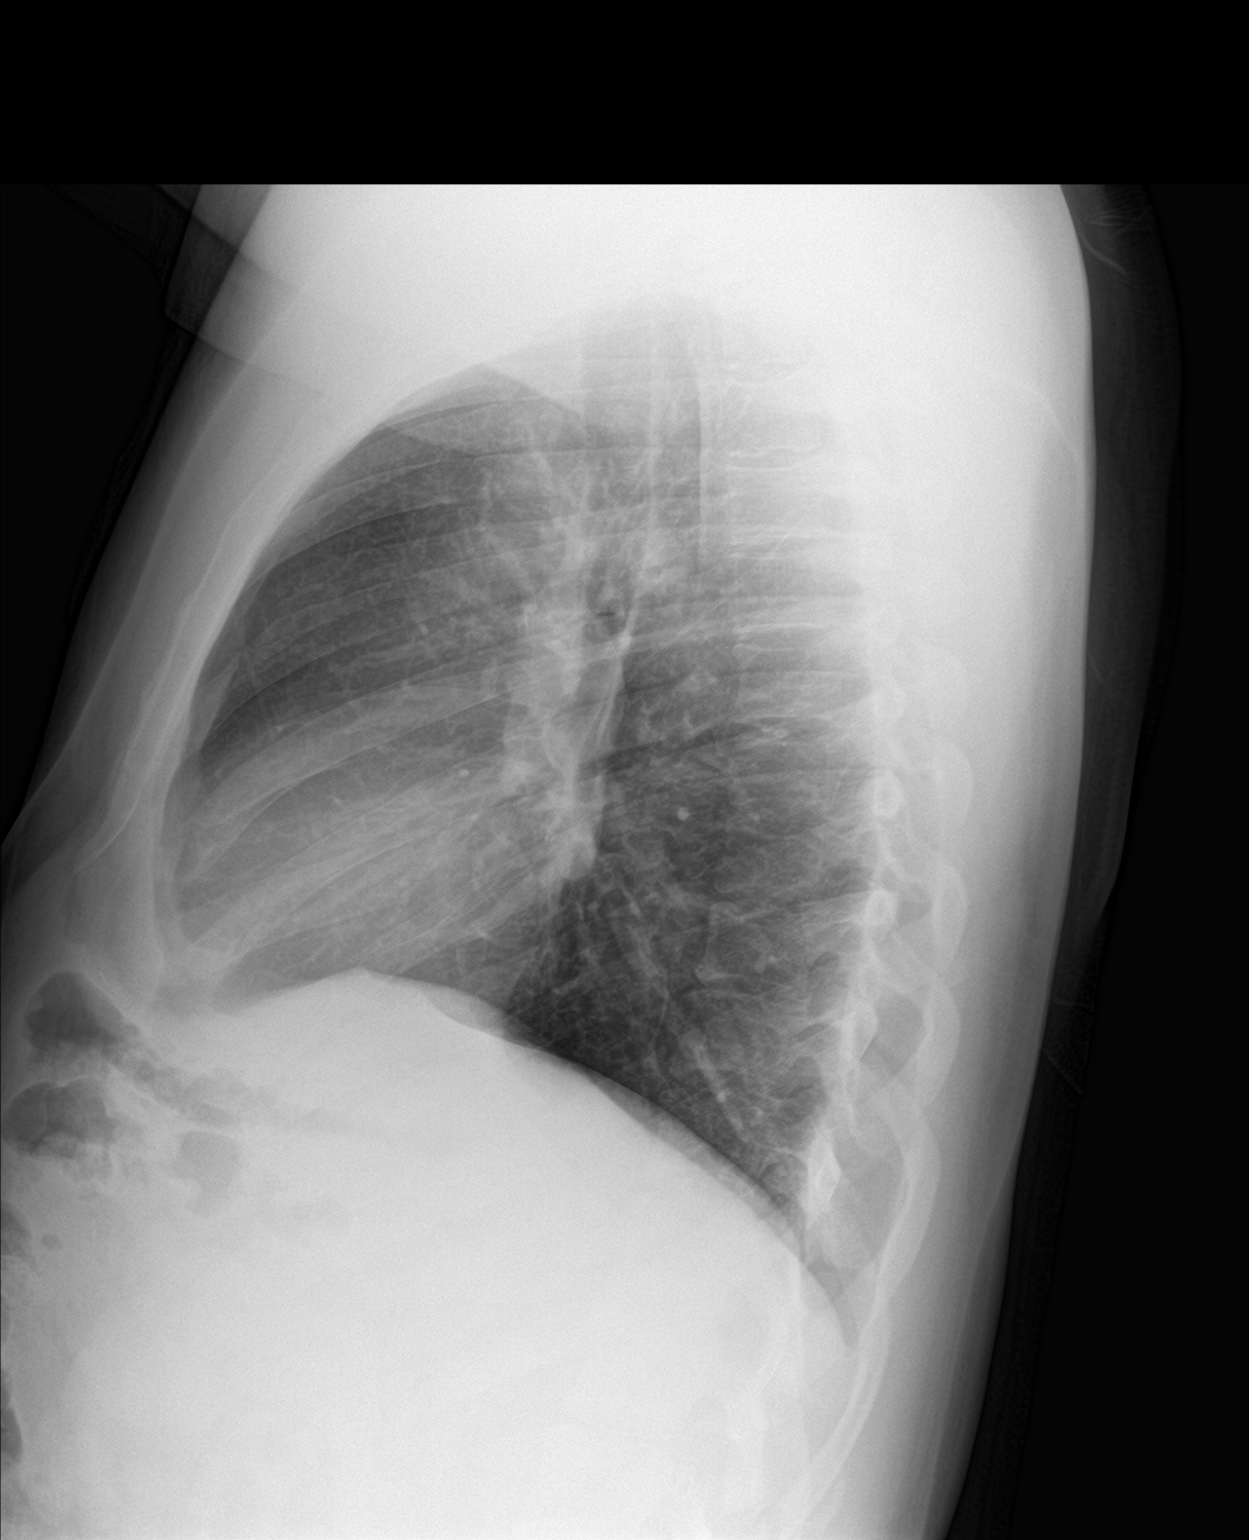

[2 of 2 positions shown; findings below may reference images not displayed]

FINDINGS: Heart size and mediastinal contours are within normal limits. No
suspicious pulmonary opacities identified.

No pleural effusion or pneumothorax visualized.

No acute osseous abnormality appreciated.
IMPRESSION: No acute intrathoracic process identified.

## 2021-05-13 MED ORDER — ALBUTEROL SULFATE HFA 108 (90 BASE) MCG/ACT IN AERS: 1.0000 | INHALATION_SPRAY | Freq: Four times a day (QID) | RESPIRATORY_TRACT | 0 refills | Status: DC | PRN

## 2021-05-13 MED ORDER — BENZONATATE 100 MG PO CAPS: 100.0000 mg | ORAL_CAPSULE | Freq: Three times a day (TID) | ORAL | 0 refills | Status: DC | PRN

## 2021-05-13 MED ORDER — PREDNISONE 20 MG PO TABS: 20.0000 mg | ORAL_TABLET | Freq: Every day | ORAL | 0 refills | Status: AC
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
## 2021-05-13 NOTE — Discharge Instructions (Signed)
Please take medications as prescribed Use your inhaler as prescribed Chest x-ray is negative for pneumonia Cough takes a few days to week or more to resolve For worsening symptoms please return to urgent care to be reevaluated

## 2021-05-13 NOTE — ED Triage Notes (Signed)
Pt c/o a non-productive cough that has gotten progressively worse over the past 2 weeks.

## 2021-05-17 ENCOUNTER — Other Ambulatory Visit: Payer: Self-pay

## 2021-05-17 ENCOUNTER — Emergency Department (HOSPITAL_COMMUNITY)
Admission: EM | Admit: 2021-05-17 | Discharge: 2021-05-17 | Disposition: A | Discharge status: Home / Self Care | Payer: Medicaid Other | Attending: Physician Assistant | Admitting: Physician Assistant

## 2021-05-17 ENCOUNTER — Encounter (HOSPITAL_COMMUNITY): Payer: Self-pay | Admitting: Emergency Medicine

## 2021-05-17 ENCOUNTER — Other Ambulatory Visit (HOSPITAL_COMMUNITY): Payer: Self-pay

## 2021-05-17 DIAGNOSIS — M542 Cervicalgia: Secondary | ICD-10-CM | POA: Insufficient documentation

## 2021-05-17 DIAGNOSIS — Z7982 Long term (current) use of aspirin: Secondary | ICD-10-CM | POA: Insufficient documentation

## 2021-05-17 DIAGNOSIS — J069 Acute upper respiratory infection, unspecified: Secondary | ICD-10-CM | POA: Insufficient documentation

## 2021-05-17 DIAGNOSIS — H6691 Otitis media, unspecified, right ear: Secondary | ICD-10-CM | POA: Insufficient documentation

## 2021-05-17 DIAGNOSIS — H6690 Otitis media, unspecified, unspecified ear: Secondary | ICD-10-CM

## 2021-05-17 DIAGNOSIS — R599 Enlarged lymph nodes, unspecified: Secondary | ICD-10-CM | POA: Insufficient documentation

## 2021-05-17 MED ORDER — OXYCODONE-ACETAMINOPHEN 5-325 MG PO TABS
1.0000 | ORAL_TABLET | Freq: Once | ORAL | Status: AC
  Administered 2021-05-17: 1 via ORAL
  Filled 2021-05-17: qty 1

## 2021-05-17 MED ORDER — AMOXICILLIN-POT CLAVULANATE 875-125 MG PO TABS: 1.0000 | ORAL_TABLET | Freq: Two times a day (BID) | ORAL | 0 refills | Status: AC

## 2021-05-17 NOTE — Discharge Instructions (Signed)
You were given a prescription for antibiotics. Please take the antibiotic prescription fully.   Please follow up with your primary care provider within 5-7 days for re-evaluation of your symptoms. If you do not have a primary care provider, information for a healthcare clinic has been provided for you to make arrangements for follow up care. Please return to the emergency department for any new or worsening symptoms.  
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
## 2021-05-17 NOTE — ED Provider Notes (Signed)
[facility] EMERGENCY DEPARTMENT Provider Note   CSN: [id number] Arrival date & time: 05/17/21  0144     History  Chief Complaint  Patient presents with   Otalgia    Christopher Marks is a 31 y.o. male.  HPI  31 year old male presents to the emergency department today for evaluation of right ear pain that he states he has had for the last 24 hours.  Patient has recently been diagnosed with an upper respiratory infection and has been on medication for that.  He denies any fevers.  His pain is constant and severe in nature located to the right ear.  He also has some pain to the lymph nodes in the neck on the right side.  He has not had any bleeding or drainage from his ear.  Home Medications Prior to Admission medications   Medication Sig Start Date End Date Taking? Authorizing Provider  amoxicillin-clavulanate (AUGMENTIN) 875-125 MG tablet Take 1 tablet by mouth 2 (two) times daily for 7 days. 05/17/21 05/24/21 Yes Samarra Ridgely S, PA-C  acetaminophen (TYLENOL) 325 MG tablet Take 2 tablets (650 mg total) by mouth every 4 (four) hours as needed for headache. 03/12/20   Angiulli, Mcarthur Rossetti, PA-C  albuterol (VENTOLIN HFA) 108 (90 Base) MCG/ACT inhaler Inhale 1-2 puffs into the lungs every 6 (six) hours as needed for wheezing or shortness of breath. 05/13/21   Merrilee Jansky, MD  aspirin EC 81 MG EC tablet Take 1 tablet (81 mg total) by mouth daily. Swallow whole. 03/12/20   Angiulli, Mcarthur Rossetti, PA-C  benzonatate (TESSALON) 100 MG capsule Take 1 capsule (100 mg total) by mouth 3 (three) times daily as needed for cough. 05/13/21   Merrilee Jansky, MD  diltiazem (CARDIZEM) 60 MG tablet TAKE 1 TABLET (60 MG TOTAL) BY MOUTH EVERY 8 (EIGHT) HOURS. 09/24/20 09/24/21  McClung, Marzella Schlein, PA-C  ELIQUIS 5 MG TABS tablet TAKE 1 TABLET (5 MG TOTAL) BY MOUTH 2 (TWO) TIMES DAILY. 06/11/20   Hoy Register, MD  methocarbamol (ROBAXIN) 500 MG tablet TAKE 1 TO 2 TABLETS BY MOUTH 3 TIMES DAILY  AS NEEDED MUSCLE SPASMS. 05/25/20 05/25/21  Hoy Register, MD  metoprolol tartrate (LOPRESSOR) 50 MG tablet Take 1 tablet (50 mg total) by mouth 2 (two) times daily. 04/27/21 05/27/21  Cristina Gong, PA-C  OLANZapine (ZYPREXA) 10 MG tablet Take 10 mg by mouth daily. 04/04/20   [provider]  predniSONE (DELTASONE) 20 MG tablet Take 1 tablet (20 mg total) by mouth daily for 5 days. 05/13/21 05/18/21  LampteyBritta Mccreedy, MD  tetrahydrozoline 0.05 % ophthalmic solution Place 1 drop into both eyes 2 (two) times daily as needed (red eyes).    [provider]  topiramate (TOPAMAX) 25 MG tablet Take 1 tablet (25 mg total) by mouth daily. 04/04/20 04/26/20  Anders Simmonds, PA-C      Allergies    Patient has no known allergies.    Review of Systems   Review of Systems See HPI for pertinent positives or negatives.   Physical Exam Updated Vital Signs BP (!) 181/126 (BP Location: Right Arm)    Pulse 97    Temp 98.5 F (36.9 C) (Oral)    Resp (!) 25    SpO2 100%  Physical Exam Constitutional:      General: He is not in acute distress.    Appearance: He is well-developed.  HENT:     Right Ear: Tympanic membrane is erythematous and bulging.  Left Ear: Tympanic membrane is bulging.  Eyes:     Conjunctiva/sclera: Conjunctivae normal.  Cardiovascular:     Rate and Rhythm: Normal rate.  Pulmonary:     Effort: Pulmonary effort is normal.  Skin:    General: Skin is warm and dry.  Neurological:     General: No focal deficit present.     Mental Status: He is alert and oriented to person, place, and time.     Gait: Gait normal.     ED Results / Procedures / Treatments   Labs (all labs ordered are listed, but only abnormal results are displayed) Labs Reviewed - No data to display  EKG None  Radiology No results found.  Procedures Procedures    Medications Ordered in ED Medications  oxyCODONE-acetaminophen (PERCOCET/ROXICET) 5-325 MG per tablet 1 tablet (1  tablet Oral Given 05/17/21 0208)    ED Course/ Medical Decision Making/ A&P                           Medical Decision Making  Patient presents with otalgia and exam consistent with acute otitis media. No concern for acute mastoiditis, meningitis, TM rupture or other emergent cause of symptoms. Pt was given a prescription for antibiotics. Advised on plan for f/u and strict return precautions. All questions answered, pt stable for discharge.   Final Clinical Impression(s) / ED Diagnoses Final diagnoses:  Acute otitis media, unspecified otitis media type    Rx / DC Orders ED Discharge Orders          Ordered    amoxicillin-clavulanate (AUGMENTIN) 875-125 MG tablet  2 times daily        05/17/21 0152              Karrie Meres, PA-C 05/17/21 0214    Zadie Rhine, MD 05/17/21 [phone]

## 2024-01-04 ENCOUNTER — Ambulatory Visit (INDEPENDENT_AMBULATORY_CARE_PROVIDER_SITE_OTHER)

## 2024-01-04 ENCOUNTER — Encounter (HOSPITAL_COMMUNITY): Payer: Self-pay

## 2024-01-04 ENCOUNTER — Ambulatory Visit (HOSPITAL_COMMUNITY)
Admission: RE | Admit: 2024-01-04 | Discharge: 2024-01-04 | Disposition: A | Discharge status: Home / Self Care | Source: Ambulatory Visit | Attending: Student | Admitting: Student

## 2024-01-04 VITALS — BP 147/94 | HR 85 | Temp 98.0°F | Resp 14

## 2024-01-04 DIAGNOSIS — J209 Acute bronchitis, unspecified: Secondary | ICD-10-CM

## 2024-01-04 DIAGNOSIS — R052 Subacute cough: Secondary | ICD-10-CM

## 2024-01-04 MED ORDER — ALBUTEROL SULFATE HFA 108 (90 BASE) MCG/ACT IN AERS
1.0000 | INHALATION_SPRAY | Freq: Once | RESPIRATORY_TRACT | Status: AC
  Administered 2024-01-04: 1 via RESPIRATORY_TRACT

## 2024-01-04 MED ORDER — ALBUTEROL SULFATE HFA 108 (90 BASE) MCG/ACT IN AERS
INHALATION_SPRAY | RESPIRATORY_TRACT | Status: AC
  Filled 2024-01-04: qty 6.7

## 2024-01-04 MED ORDER — PREDNISONE 20 MG PO TABS: 40.0000 mg | ORAL_TABLET | Freq: Every day | ORAL | 0 refills | Status: AC

## 2024-01-04 NOTE — ED Triage Notes (Signed)
 Pt had cough for couple weeks. Taking Dayquil, cough drops, Nyquil as well as home remedy drink that family made for him. Pt reports pain on left ribs when coughing or putting pressure on area. Pt is a smoker. Pt reports has a knot on left side.
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
## 2024-01-04 NOTE — Discharge Instructions (Addendum)
-   Use your albuterol  inhaler as needed, up to every 4 hours while you are feeling sick. - Take the prednisone  every day in the morning for 5 days. -You can continue the over-the-counter medications like NyQuil and DayQuil.

## 2024-01-26 ENCOUNTER — Ambulatory Visit: Attending: Obstetrics and Gynecology

## 2024-01-26 DIAGNOSIS — Z31440 Encounter of male for testing for genetic disease carrier status for procreative management: Secondary | ICD-10-CM

## 2024-01-26 DIAGNOSIS — Z8489 Family history of other specified conditions: Secondary | ICD-10-CM

## 2024-02-04 LAB — HORIZON CUSTOM: REPORT SUMMARY: POSITIVE — AB

## 2024-02-10 ENCOUNTER — Other Ambulatory Visit: Payer: Self-pay

## 2024-02-10 ENCOUNTER — Telehealth: Payer: Self-pay

## 2024-02-10 ENCOUNTER — Encounter (HOSPITAL_COMMUNITY): Payer: Self-pay

## 2024-02-10 ENCOUNTER — Ambulatory Visit (HOSPITAL_COMMUNITY)
Admission: RE | Admit: 2024-02-10 | Discharge: 2024-02-10 | Disposition: A | Discharge status: Home / Self Care | Source: Ambulatory Visit | Attending: Internal Medicine | Admitting: Internal Medicine

## 2024-02-10 VITALS — BP 150/92 | HR 87 | Temp 98.3°F | Resp 18

## 2024-02-10 DIAGNOSIS — L0102 Bockhart's impetigo: Secondary | ICD-10-CM | POA: Diagnosis not present

## 2024-02-10 DIAGNOSIS — N489 Disorder of penis, unspecified: Secondary | ICD-10-CM | POA: Insufficient documentation

## 2024-02-10 DIAGNOSIS — Z113 Encounter for screening for infections with a predominantly sexual mode of transmission: Secondary | ICD-10-CM | POA: Insufficient documentation

## 2024-02-10 LAB — HIV ANTIBODY (ROUTINE TESTING W REFLEX): HIV Screen 4th Generation wRfx: NONREACTIVE

## 2024-02-10 MED ORDER — DOXYCYCLINE HYCLATE 100 MG PO CAPS: 100.0000 mg | ORAL_CAPSULE | Freq: Two times a day (BID) | ORAL | 0 refills | Status: AC
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
## 2024-02-10 NOTE — Discharge Instructions (Addendum)
 Take doxycycline 100mg  every 12 hours for 7 days to treat infected lesion of your penis.  Doxycycline is an antibiotic that will help with the infection.  Use warm compresses to the penile lesion.   STD testing pending, this will take 2-3 days to result. We will only call you if your testing is positive for any infection(s) and we will provide treatment.  Avoid sexual intercourse until your STD results come back.  If any of your STD results are positive, you will need to avoid sexual intercourse for 7 days while you are being treated to prevent spread of STD.  Condom use is the best way to prevent spread of STDs. Notify partner(s) of any positive results.  Return to urgent care as needed.

## 2024-02-10 NOTE — Telephone Encounter (Signed)
 Spoke with patient and his partner Trellis Rout DOB 08[birth year]09/1987 to review his carrier screening results.   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 We reviewed since Katrina was not found to be a carrier, there is a very low chance their pregnancy would be affected.   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 Please see report for details. We discussed that this couple's current and future pregnancies are not at an increased risk for alpha thalassemia, including hemoglobin Bart's disease. Based on the results, there is a 25% chance the current pregnancy would be an alpha thalassemia carrier in the trans configuration (-?/-?). There is a 50% chance the pregnancy would be silent carrier for alpha thalassemia (??/-?). There is a 25% chance the pregnancy would be unaffected and not a carrier (??/??).   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 Therefore, there is a 25% chance that this couple's pregnancy would be affected with SMA. We reviewed the natural history and prognosis of the condition.   SMA is an autosomal recessive disease caused by loss of function mutations in the SMN1 gene. Typically, the SMN1 gene provides instructions for the Survival Motor Neuron (SMN) protein. This protein has important functions in the maintenance of motor neurons in the skeletal muscles that allow the body to move. Affected individuals with loss of function mutations in the SMN1 gene have a shortage of SMN protein which causes progressive motor neuron death, leading to the symptoms of SMA. A similar gene, SMN2, provides instructions to create a full length SMN protein approximately 10% of the time. Thus, the number of copies of the SMN2 gene an affected individual has  modifies the severity of symptoms they experience and classification between different types of the condition. The five types of SMA listed below are historically classified based on clinical presentation, onset of symptoms, and the maximum skill level of motor milestones achieved. There is a lot of overlap between types. Type Zero is the most severe and is characterized by symptom onset in utero and death soon after birth. Type I is the most common with symptom onset before six months of age a two-year life expectancy. Infants with SMA type I typically have severe hypotonia, difficulty breathing, poor suck, and are unable to sit independently.  Type II has onset of symptoms between six to eighteen months of age. Individuals with SMA type II are able to sit without support, although they may not retain this skill, and they are not able to walk, have generalized muscle weakness, and may develop kyphoscoliosis.  Type III has onset of symptoms after eighteen months of age. Individuals with SMA type III may be able to sit and walk independently, although they may not retain these skills, and may develop scoliosis. Type IV has onset of symptoms in adulthood. Individuals with SMA type IV typically have mild motor impairment and respiratory problems. Several treatments are available for individuals affected with SMA and studies show that treatment is most effective when it is started in the first few months of life.   We reviewed current options to determine fetal risk, such as amniocentesis, UNITY single gene NIPS, and postnatal testing. The technical aspects, benefits, risks, and limitations of each were  reviewed with the couple. His partner strongly declined amniocentesis. She also declined UNITY single gene NIPS and elected to wait for the Newborn Screening results. She stated a diagnosis would not impact her pregnancy decision outcomes.   Lauraine Bodily, MS, [facility] Certified Genetic Counselor [facility]  for Maternal Fetal [facility] [phone]
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
## 2024-02-11 LAB — RPR: RPR Ser Ql: NONREACTIVE

## 2024-02-12 LAB — CYTOLOGY, (ORAL, ANAL, URETHRAL) ANCILLARY ONLY
Chlamydia: NEGATIVE
Comment: NEGATIVE
Comment: NEGATIVE
Comment: NORMAL
Neisseria Gonorrhea: NEGATIVE
Trichomonas: NEGATIVE

## 2024-03-02 ENCOUNTER — Ambulatory Visit (HOSPITAL_COMMUNITY)

## 2024-03-05 ENCOUNTER — Encounter (HOSPITAL_COMMUNITY): Payer: Self-pay

## 2024-03-05 ENCOUNTER — Ambulatory Visit (HOSPITAL_COMMUNITY)
Admission: RE | Admit: 2024-03-05 | Discharge: 2024-03-05 | Disposition: A | Discharge status: Home / Self Care | Payer: Self-pay | Source: Ambulatory Visit | Attending: Emergency Medicine | Admitting: Emergency Medicine

## 2024-03-05 VITALS — BP 152/96 | HR 68 | Temp 99.1°F | Resp 16 | Ht 67.0 in | Wt 187.0 lb

## 2024-03-05 DIAGNOSIS — B3742 Candidal balanitis: Secondary | ICD-10-CM | POA: Diagnosis present

## 2024-03-05 MED ORDER — CLOTRIMAZOLE 1 % EX CREA: TOPICAL_CREAM | CUTANEOUS | 0 refills | Status: DC
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
## 2024-03-05 NOTE — Discharge Instructions (Addendum)
 Use the Lotrimin  twice daily to the area.  Clean with warm water  and a hypoallergenic soap such as Dove sensitive skin.  Pat dry and then apply the Lotrimin .  The swab that we did will be back over the next 2 or so and you will be contacted if results are abnormal.  Return to clinic for any new or urgent symptoms.

## 2024-03-05 NOTE — ED Triage Notes (Signed)
 Rash on the private area. Patient states was tested for STIs 3 weeks ago and they were negative but given meds to treat some kind of bump in the area. That resolved. First noticed the rash yesterday. States there is some dysuria due to there being something like a cut in that area when he urinates.   Denies any new foods, meds, products, or sexual partners.

## 2024-03-06 LAB — HSV 1/2 PCR (SURFACE)
HSV-1 DNA: NOT DETECTED
HSV-2 DNA: NOT DETECTED

## 2024-03-19 ENCOUNTER — Ambulatory Visit (HOSPITAL_COMMUNITY)

## 2024-03-22 ENCOUNTER — Encounter (INDEPENDENT_AMBULATORY_CARE_PROVIDER_SITE_OTHER): Payer: Self-pay | Admitting: Primary Care

## 2024-03-22 ENCOUNTER — Ambulatory Visit (INDEPENDENT_AMBULATORY_CARE_PROVIDER_SITE_OTHER): Admitting: Primary Care

## 2024-03-22 VITALS — BP 130/90 | HR 89 | Resp 16 | Ht 66.0 in | Wt 176.6 lb

## 2024-03-22 DIAGNOSIS — Z7689 Persons encountering health services in other specified circumstances: Secondary | ICD-10-CM

## 2024-03-22 DIAGNOSIS — N4889 Other specified disorders of penis: Secondary | ICD-10-CM

## 2024-03-22 DIAGNOSIS — I10 Essential (primary) hypertension: Secondary | ICD-10-CM

## 2024-03-22 MED ORDER — AMLODIPINE BESYLATE 10 MG PO TABS: 10.0000 mg | ORAL_TABLET | Freq: Every day | ORAL | 1 refills | Status: AC

## 2024-03-22 MED ORDER — HYDROCHLOROTHIAZIDE 25 MG PO TABS: 25.0000 mg | ORAL_TABLET | Freq: Every day | ORAL | 3 refills | Status: DC

## 2024-03-22 NOTE — Patient Instructions (Addendum)
 Hydrochlorothiazide  Capsules or Tablets What is this medication? HYDROCHLOROTHIAZIDE  (hye DROE klor oh THYE a zide ) treats high blood pressure. It may also be used to reduce swelling related to heart, kidney, or liver disease. It works by helping your kidneys remove more fluid and salt from your blood through the urine. It belongs to a group of medications called diuretics. This medicine may be used for other purposes; ask your health care provider or pharmacist if you have questions. COMMON BRAND NAME(S): Esidrix , Ezide, HydroDIURIL , Microzide , Oretic , Zide  What should I tell my care team before I take this medication? They need to know if you have any of these conditions: Diabetes Gout Kidney disease Liver disease Lupus An unusual or allergic reaction to hydrochlorothiazide , other medications, foods, dyes, or preservatives Pregnant or trying to get pregnant Breastfeeding How should I use this medication? Take this medication by mouth. Take it as directed on the prescription label at the same time every day. You can take it with or without food. If it upsets your stomach, take it with food. Keep taking it unless your care team tells you to stop. Talk to your care team about the use of this medication in children. While it may be prescribed for children for selected conditions, precautions do apply. Overdosage: If you think you have taken too much of this medicine contact a poison control center or emergency room at once. NOTE: This medicine is only for you. Do not share this medicine with others. What if I miss a dose? If you miss a dose, take it as soon as you can. If it is almost time for your next dose, take only that dose. Do not take double or extra doses. What may interact with this medication? Do not take this medication with any of the following: Cidofovir Dofetilide Tranylcypromine This medication may also interact with the  following: Cholestyramine Colestipol Digoxin Lithium Medications for diabetes NSAIDS, medications for pain and inflammation, such as ibuprofen or naproxen  Other medications for blood pressure This list may not describe all possible interactions. Give your health care provider a list of all the medicines, herbs, non-prescription drugs, or dietary supplements you use. Also tell them if you smoke, drink alcohol, or use illegal drugs. Some items may interact with your medicine. What should I watch for while using this medication? Visit your care team for regular checks on your progress. Check your blood pressure as directed. Know what your blood pressure should be and when to contact your care team. This medication may affect your coordination, reaction time, or judgment. Do not drive or operate machinery until you know how this medication affects you. Sit up or stand slowly to reduce the risk of dizzy or fainting spells. Drinking alcohol with this medication can increase the risk of these side effects. Taking this medication is only part of a total heart healthy program. Ask your care team if there are other changes you can make to improve your overall health. Do not treat yourself for coughs, colds, or pain while you are using this medication without asking your care team for advice. Some medications may increase your blood pressure. Talk to your care team about your risk of skin cancer. You may be more at risk for skin cancer if you take this medication. To lower your risk, keep out of the sun. If you cannot avoid being in the sun, wear protective clothing and sunscreen. Do not use sun lamps, tanning beds, or tanning booths. This medication may increase blood sugar.  The risk may be higher in patients who already have diabetes. Ask your care team what you can do to lower your risk of diabetes while taking this medication. What side effects may I notice from receiving this medication? Side effects that  you should report to your care team as soon as possible: Allergic reactions--skin rash, itching, hives, swelling of the face, lips, tongue, or throat Dehydration--increased thirst, dry mouth, feeling faint or lightheaded, headache, dark yellow or brown urine Gout--severe pain, redness, warmth, or swelling in joints, such as the big toe Kidney injury--decrease in the amount of urine, swelling of the ankles, hands, or feet Low blood pressure--dizziness, feeling faint or lightheaded, blurry vision Low potassium level--muscle pain or cramps, unusual weakness, fatigue, fast or irregular heartbeat, constipation Sudden eye pain or change in vision such as blurred vision, seeing halos around lights, vision loss Side effects that usually do not require medical attention (report to your care team if they continue or are bothersome): Change in sex drive or performance Headache Upset stomach This list may not describe all possible side effects. Call your doctor for medical advice about side effects. You may report side effects to FDA at 1-800-FDA-1088. Where should I keep my medication? Keep out of the reach of children and pets. Store at room temperature between 20 and 25 degrees C (68 and 77 degrees F). Protect from light and moisture. Keep the container tightly closed. Do not freeze. Get rid of any unused medication after the expiration date. To get rid of medications that are no longer needed or have expired: Take the medication to a take-back program. Check with your pharmacy or law enforcement to find a location. If you cannot return the medication, check the label or package insert to see if the medication should be thrown out in the garbage or flushed down the toilet. If you are not sure, ask your care team. If it is safe to put it in the trash, empty the medication out of the container. Mix it with cat litter, dirt, coffee grounds, or another unwanted substance. Seal the mixture in a bag or container.  Put it in the trash. NOTE: This sheet is a summary. It may not cover all possible information. If you have questions about this medicine, talk to your doctor, pharmacist, or health care provider.  2025 Elsevier/Gold Standard (2023-05-29 00:00:00)Amlodipine  Tablets What is this medication? AMLODIPINE  (am LOE di peen) treats high blood pressure and prevents chest pain (angina). It works by relaxing the blood vessels, which helps decrease the amount of work your heart has to do. It belongs to a group of medications called calcium  channel blockers. This medicine may be used for other purposes; ask your health care provider or pharmacist if you have questions. COMMON BRAND NAME(S): Norvasc  What should I tell my care team before I take this medication? They need to know if you have any of these conditions: Heart disease Liver disease An unusual or allergic reaction to amlodipine , other medications, foods, dyes, or preservatives Pregnant or trying to get pregnant Breastfeeding How should I use this medication? Take this medication by mouth. Take it as directed on the prescription label at the same time every day. You can take it with or without food. If it upsets your stomach, take it with food. Keep taking it unless your care team tells you to stop. Talk to your care team about the use of this medication in children. While it may be prescribed for children as young as 6  for selected conditions, precautions do apply. Overdosage: If you think you have taken too much of this medicine contact a poison control center or emergency room at once. NOTE: This medicine is only for you. Do not share this medicine with others. What if I miss a dose? If you miss a dose, take it as soon as you can. If it is almost time for your next dose, take only that dose. Do not take double or extra doses. What may interact with this  medication? Clarithromycin Cyclosporine Diltiazem  Itraconazole Simvastatin Tacrolimus This list may not describe all possible interactions. Give your health care provider a list of all the medicines, herbs, non-prescription drugs, or dietary supplements you use. Also tell them if you smoke, drink alcohol, or use illegal drugs. Some items may interact with your medicine. What should I watch for while using this medication? Visit your care team for regular checks on your progress. Check your blood pressure as directed. Know what your blood pressure should be and when to contact your care team. Do not treat yourself for coughs, colds, or pain while you are using this medication without asking your care team for advice. Some medications may increase your blood pressure. This medication may affect your coordination, reaction time, or judgment. Do not drive or operate machinery until you know how this medication affects you. Sit up or stand slowly to reduce the risk of dizzy or fainting spells. Drinking alcohol with this medication can increase the risk of these side effects. What side effects may I notice from receiving this medication? Side effects that you should report to your care team as soon as possible: Allergic reactions--skin rash, itching, hives, swelling of the face, lips, tongue, or throat Heart attack--pain or tightness in the chest, shoulders, arms, or jaw, nausea, shortness of breath, cold or clammy skin, feeling faint or lightheaded Low blood pressure--dizziness, feeling faint or lightheaded, blurry vision Worsening chest pain (angina)--pain, pressure, or tightness in the chest, neck, back, or arms Side effects that usually do not require medical attention (report these to your care team if they continue or are bothersome): Facial flushing, redness Heart palpitations--rapid, pounding, or irregular heartbeat Nausea Stomach pain Swelling of the ankles, hands, or feet This list may  not describe all possible side effects. Call your doctor for medical advice about side effects. You may report side effects to FDA at 1-800-FDA-1088. Where should I keep my medication? Keep out of the reach of children and pets. Store at room temperature between 20 and 25 degrees C (68 and 77 degrees F). Protect from light and moisture. Keep the container tightly closed. Get rid of any unused medication after the expiration date. To get rid of medications that are no longer needed or have expired: Take the medication to a medication take-back program. Check with your pharmacy or law enforcement to find a location. If you cannot return the medication, check the label or package insert to see if the medication should be thrown out in the garbage or flushed down the toilet. If you are not sure, ask your care team. If it is safe to put in the trash, empty the medication out of the container. Mix the medication with cat litter, dirt, coffee grounds, or other unwanted substance. Seal the mixture in a bag or container. Put it in the trash. NOTE: This sheet is a summary. It may not cover all possible information. If you have questions about this medicine, talk to your doctor, pharmacist, or health care provider.  2024 Elsevier/Gold Standard (2021-10-21 00:00:00)

## 2024-03-22 NOTE — Progress Notes (Signed)
 New Patient Office Visit  Subjective    Patient ID: Christopher Marks male  DOB: 08-07-1990  Age: 33 y.o. MRN: [id number]   CC:  Follow-up on this she had that comes and goes was treated with medication while incarcerated and it would resolve. The penile head has a red rash that has been present for 3 weeks not painful. HPI     New Patient (Initial Visit)    Additional comments: Establish care  Has a bump on the shaft that comes and goes started while in prison. Was given a pill for it in prison went away and came back   The penile head has a red rash that has been present for 3 weeks not painful  Pt states the cream that was prescribed is not helping   Pt states he has high blood pressure pt states while in prison they had him on 3 different bp medication       Last edited by Christopher Marks, RMA on 03/22/2024  3:50 PM.      HPI  Christopher Marks is a 33 year old male overweight heterosexual male incarcerated for 2 years concerns noted in chief complaint.  In today to establish care and treatment for hypertension and questionable bumps in groin area.  He has been tested for every STD and was negative but not sure what he was given the name of medication while incarcerated that would make the bumps on the shaft go away.  Question if it was herpes he stated no.  Blood pressure is elevated this is a chronic condition and was on medication until discharge. Current Outpatient Medications on File Prior to Visit  Medication Sig Dispense Refill   clotrimazole  (LOTRIMIN ) 1 % cream Apply to affected area 2 times daily 28 g 0   [DISCONTINUED] topiramate  (TOPAMAX ) 25 MG tablet Take 1 tablet (25 mg total) by mouth daily. 30 tablet 1   No current facility-administered medications on file prior to visit.     No Known Allergies  Past Medical History:  Diagnosis Date   Hypertension    Stroke (HCC) 01/26/2020     Past Surgical History:  Procedure Laterality Date   TIBIA IM NAIL  INSERTION Right 01/26/2020   Procedure: INTRAMEDULLARY (IM) NAIL TIBIAL WITH IRRIGATION AND DEBRIDMENT OF OPEN FRACTURE.;  Surgeon: Kendal Franky SQUIBB, MD;  Location: [facility] OR;  Service: Orthopedics;  Laterality: Right;     History reviewed. No pertinent family history.  Social History   Socioeconomic History   Marital status: Single    Spouse name: Not on file   Number of children: Not on file   Years of education: Not on file   Highest education level: Not on file  Occupational History   Occupation: unemployed 04/26/20  Tobacco Use   Smoking status: Every Day    Current packs/day: 0.50    Types: Cigarettes   Smokeless tobacco: Never  Vaping Use   Vaping status: Never Used  Substance and Sexual Activity   Alcohol use: Yes   Drug use: Yes    Types: Marijuana   Sexual activity: Yes  Other Topics Concern   Not on file  Social History Narrative   Lives with grandmother   Right Handed   Drinks 3-5 cups daily       Social Drivers of Health   Tobacco Use: High Risk (03/22/2024)   Patient History    Smoking Tobacco Use: Every Day    Smokeless Tobacco Use: Never  Passive Exposure: Not on file  Financial Resource Strain: Not on file  Food Insecurity: Not on file  Transportation Needs: Not on file  Physical Activity: Not on file  Stress: Not on file  Social Connections: Not on file  Intimate Partner Violence: Not on file  Depression (PHQ2-9): Low Risk (03/22/2024)   Depression (PHQ2-9)    PHQ-2 Score: 4  Alcohol Screen: Not on file  Housing: Not on file  Utilities: Not on file  Health Literacy: Not on file    SDOH Interventions Today    Flowsheet Row Most Recent Value  SDOH Interventions   Depression Interventions/Treatment  PHQ2-9 Score <4 Follow-up Not Indicated     Health Maintenance  Topic Date Due   COVID-19 Vaccine (1) Never done   Hepatitis C Screening  Never done   Pneumococcal Vaccine (1 of 2 - PCV) Never done   Hepatitis B Vaccine (1 of 3 - 19+ 3-dose  series) Never done   HPV Vaccine (1 - Risk 3-dose SCDM series) Never done   Flu Shot  Never done   DTaP/Tdap/Td vaccine (3 - Td or Tdap) 04/13/2027   HIV Screening  Completed   Meningitis B Vaccine  Aged Out    Objective    BP (!) 130/90   Pulse 89   Resp 16   Ht 5' 6 (1.676 m)   Wt 176 lb 9.6 oz (80.1 kg)   SpO2 99%   BMI 28.50 kg/m  BP Readings from Last 3 Encounters:  03/22/24 (!) 130/90  03/05/24 (!) 152/96  02/10/24 (!) 150/92       Physical Exam Vitals reviewed.  Constitutional:      Appearance: Normal appearance.     Comments: overweight  HENT:     Head: Normocephalic.     Right Ear: Tympanic membrane, ear canal and external ear normal.     Left Ear: Tympanic membrane, ear canal and external ear normal.     Nose: Nose normal.  Eyes:     Extraocular Movements: Extraocular movements intact.     Pupils: Pupils are equal, round, and reactive to light.  Cardiovascular:     Rate and Rhythm: Normal rate and regular rhythm.  Pulmonary:     Effort: Pulmonary effort is normal.     Breath sounds: Normal breath sounds.  Abdominal:     General: Bowel sounds are normal.     Palpations: Abdomen is soft.  Genitourinary:    Comments: Red area on shaft abrasion  pearly penile papules Musculoskeletal:        General: Normal range of motion.     Cervical back: Normal range of motion and neck supple.  Skin:    General: Skin is warm and dry.  Neurological:     Mental Status: He is alert and oriented to person, place, and time.  Psychiatric:        Mood and Affect: Mood normal.        Behavior: Behavior normal.        Thought Content: Thought content normal.        Judgment: Judgment normal.        Assessment & Plan:  Christopher Marks was seen today for new patient (initial visit).  Diagnoses and all orders for this visit:  Benign essential HTN BP goal - < 130/80 Explained that having normal blood pressure is the goal and medications are helping to get to goal and  maintain normal blood pressure. DIET: Limit salt intake, read nutrition labels to  check salt content, limit fried and high fatty foods  Avoid using multisymptom OTC cold preparations that generally contain sudafed which can rise BP. Consult with pharmacist on best cold relief products to use for persons with HTN EXERCISE Discussed incorporating exercise such as walking - 30 minutes most days of the week and can do in 10 minute intervals    -     amLODipine  (NORVASC ) 10 MG tablet; Take 1 tablet (10 mg total) by mouth daily. -     hydrochlorothiazide  (HYDRODIURIL ) 25 MG tablet; Take 1 tablet (25 mg total) by mouth daily.  Encounter to establish care  Bowenoid papulosis of penis  Pearly penile papules    Follow-up:    The above assessment and management plan was discussed with the patient. The patient verbalized understanding of and has agreed to the management plan. Patient is aware to call the clinic if symptoms fail to improve or worsen. Patient is aware when to return to the clinic for a follow-up visit. Patient educated on when it is appropriate to go to the emergency department.   Rosaline Bohr, NP-C

## 2024-04-11 ENCOUNTER — Telehealth (INDEPENDENT_AMBULATORY_CARE_PROVIDER_SITE_OTHER): Payer: Self-pay | Admitting: Primary Care

## 2024-04-11 NOTE — Telephone Encounter (Signed)
 Called pt to confirm appt. Pt did not answer and Lvm

## 2024-04-12 ENCOUNTER — Ambulatory Visit (INDEPENDENT_AMBULATORY_CARE_PROVIDER_SITE_OTHER): Admitting: Primary Care

## 2024-04-25 ENCOUNTER — Encounter (HOSPITAL_COMMUNITY): Payer: Self-pay | Admitting: Emergency Medicine

## 2024-04-25 ENCOUNTER — Telehealth (INDEPENDENT_AMBULATORY_CARE_PROVIDER_SITE_OTHER): Payer: Self-pay | Admitting: Primary Care

## 2024-04-25 ENCOUNTER — Ambulatory Visit (HOSPITAL_COMMUNITY): Admission: EM | Admit: 2024-04-25 | Discharge: 2024-04-25 | Disposition: A | Discharge status: Home / Self Care

## 2024-04-25 DIAGNOSIS — A084 Viral intestinal infection, unspecified: Secondary | ICD-10-CM

## 2024-04-25 MED ORDER — ONDANSETRON 4 MG PO TBDP: 4.0000 mg | ORAL_TABLET | Freq: Three times a day (TID) | ORAL | 0 refills | Status: DC | PRN

## 2024-04-25 MED ORDER — ONDANSETRON 4 MG PO TBDP: 4.0000 mg | ORAL_TABLET | Freq: Three times a day (TID) | ORAL | 0 refills | Status: AC | PRN

## 2024-04-25 NOTE — ED Triage Notes (Signed)
 Pt n/v/d and abd pains for 2-3 days. Reports took Mucinex.

## 2024-04-25 NOTE — Discharge Instructions (Addendum)
 1. Viral gastroenteritis (Primary) - ondansetron  (ZOFRAN -ODT) 4 MG disintegrating tablet; Take 1 tablet (4 mg total) by mouth every 8 (eight) hours as needed for nausea or vomiting.  Dispense: 20 tablet; Refill: 0 - Drink plenty of fluids, eat soft foods easy to digest, do not recommend taking any medication to help diarrhea as this is the body's way of getting rid of what is causing illness. -Treat any fever or pain with ibuprofen or Tylenol  as needed.  -Continue monitoring symptoms for any changes.  If there is any escalation of current symptoms follow-up for further evaluation and treatment in the emergency department

## 2024-04-25 NOTE — Telephone Encounter (Signed)
 Called pt to confirm appt. Pt did not answer and LVM about appt details. Please advise

## 2024-04-25 NOTE — ED Provider Notes (Signed)
 " [facility]  Note:  This document was prepared using Dragon voice recognition software and may include unintentional dictation errors.  MRN: [id number] DOB: February 04, 1991  Subjective:   Christopher Marks is a 34 y.o. male presenting for nausea, vomiting, diarrhea since Saturday.  The whole family has been sick with similar symptoms. Patient currently not taking any medication to treat symptoms.  Been drinking fluids and eating easy to digest foods with mild improvement.  Patient reports that nausea and vomiting symptoms seem to improve since yesterday.  Denies any known exposure to anyone else outside of the family with similar symptoms.  Current Medications[1]   Allergies[2]  Past Medical History:  Diagnosis Date   Hypertension    Stroke (HCC) 01/26/2020     Past Surgical History:  Procedure Laterality Date   TIBIA IM NAIL INSERTION Right 01/26/2020   Procedure: INTRAMEDULLARY (IM) NAIL TIBIAL WITH IRRIGATION AND DEBRIDMENT OF OPEN FRACTURE.;  Surgeon: Kendal Franky SQUIBB, MD;  Location: [facility] OR;  Service: Orthopedics;  Laterality: Right;    No family history on file.  Social History[3]  ROS Refer to HPI for ROS details.  Objective:    Vitals: BP 125/85 (BP Location: Right Arm)   Pulse 78   Temp 99 F (37.2 C) (Oral)   Resp 15   SpO2 96%   Physical Exam Vitals and nursing note reviewed.  Constitutional:      General: He is not in acute distress.    Appearance: Normal appearance. He is well-developed. He is not ill-appearing or toxic-appearing.  HENT:     Head: Normocephalic.  Cardiovascular:     Rate and Rhythm: Normal rate.  Pulmonary:     Effort: Pulmonary effort is normal. No respiratory distress.     Breath sounds: No stridor. No wheezing.  Abdominal:     General: There is no distension.     Tenderness: There is no abdominal tenderness. There is no right CVA tenderness, left CVA tenderness, guarding or rebound.  Skin:    General: Skin is warm  and dry.  Neurological:     General: No focal deficit present.     Mental Status: He is alert and oriented to person, place, and time.  Psychiatric:        Mood and Affect: Mood normal.        Behavior: Behavior normal.     Procedures  No results found for this or any previous visit (from the past 24 hours).  Assessment and Plan :     Discharge Instructions      1. Viral gastroenteritis (Primary) - ondansetron  (ZOFRAN -ODT) 4 MG disintegrating tablet; Take 1 tablet (4 mg total) by mouth every 8 (eight) hours as needed for nausea or vomiting.  Dispense: 20 tablet; Refill: 0 - Drink plenty of fluids, eat soft foods easy to digest, do not recommend taking any medication to help diarrhea as this is the body's way of getting rid of what is causing illness. -Treat any fever or pain with ibuprofen or Tylenol  as needed.  -Continue monitoring symptoms for any changes.  If there is any escalation of current symptoms follow-up for further evaluation and treatment in the emergency department      Declyn Delsol B Esterlene Atiyeh    [1] No current facility-administered medications for this encounter.  Current Outpatient Medications:    amLODipine  (NORVASC ) 10 MG tablet, Take 1 tablet (10 mg total) by mouth daily., Disp: 90 tablet, Rfl: 1   ondansetron  (ZOFRAN -ODT) 4 MG disintegrating tablet,  Take 1 tablet (4 mg total) by mouth every 8 (eight) hours as needed for nausea or vomiting., Disp: 20 tablet, Rfl: 0 [2] No Known Allergies [3]  Social History Tobacco Use   Smoking status: Every Day    Current packs/day: 0.50    Types: Cigarettes   Smokeless tobacco: Never  Vaping Use   Vaping status: Never Used  Substance Use Topics   Alcohol use: Yes   Drug use: Yes    Types: Marijuana     Aurea Ethel NOVAK, NP 04/25/24 1936  "

## 2024-05-02 ENCOUNTER — Telehealth: Payer: Self-pay | Admitting: Primary Care

## 2024-05-02 NOTE — Telephone Encounter (Signed)
 Left VM with pt about their upcoming appt. Pt did not answer

## 2024-05-03 ENCOUNTER — Ambulatory Visit (INDEPENDENT_AMBULATORY_CARE_PROVIDER_SITE_OTHER): Payer: Self-pay | Admitting: Primary Care
